# Patient Record
Sex: Female | Born: 1955 | Hispanic: No | Marital: Married | Smoking: Former smoker
Health system: Southern US, Community
[De-identification: ages and names within clinical notes are randomized; demographics above are authoritative.]

## PROBLEM LIST (undated history)

## (undated) DIAGNOSIS — G4730 Sleep apnea, unspecified: Secondary | ICD-10-CM

## (undated) DIAGNOSIS — M1990 Unspecified osteoarthritis, unspecified site: Secondary | ICD-10-CM

## (undated) DIAGNOSIS — M810 Age-related osteoporosis without current pathological fracture: Secondary | ICD-10-CM

## (undated) HISTORY — DX: Age-related osteoporosis without current pathological fracture: M81.0

## (undated) HISTORY — PX: OTHER SURGICAL HISTORY: SHX169

## (undated) HISTORY — PX: AUGMENTATION MAMMAPLASTY: SUR837

---

## 1999-03-26 ENCOUNTER — Encounter: Admission: RE | Admit: 1999-03-26 | Discharge: 1999-03-26 | Payer: Self-pay | Admitting: Obstetrics & Gynecology

## 1999-04-11 ENCOUNTER — Ambulatory Visit (HOSPITAL_COMMUNITY): Admission: RE | Admit: 1999-04-11 | Discharge: 1999-04-11 | Payer: Self-pay | Admitting: *Deleted

## 2000-09-24 ENCOUNTER — Emergency Department (HOSPITAL_COMMUNITY): Admission: EM | Admit: 2000-09-24 | Discharge: 2000-09-25 | Payer: Self-pay | Admitting: *Deleted

## 2000-09-25 ENCOUNTER — Encounter: Payer: Self-pay | Admitting: Gynecology

## 2000-09-25 ENCOUNTER — Encounter: Payer: Self-pay | Admitting: Emergency Medicine

## 2000-09-25 ENCOUNTER — Ambulatory Visit (HOSPITAL_COMMUNITY): Admission: RE | Admit: 2000-09-25 | Discharge: 2000-09-25 | Payer: Self-pay | Admitting: Gynecology

## 2000-10-13 ENCOUNTER — Other Ambulatory Visit: Admission: RE | Admit: 2000-10-13 | Discharge: 2000-10-13 | Payer: Self-pay | Admitting: Gynecology

## 2009-07-20 ENCOUNTER — Ambulatory Visit: Payer: Self-pay | Admitting: Internal Medicine

## 2009-07-20 ENCOUNTER — Encounter (INDEPENDENT_AMBULATORY_CARE_PROVIDER_SITE_OTHER): Payer: Self-pay | Admitting: Family Medicine

## 2009-07-20 LAB — CONVERTED CEMR LAB
BUN: 17 mg/dL (ref 6–23)
CO2: 23 meq/L (ref 19–32)
Calcium: 9 mg/dL (ref 8.4–10.5)
Chloride: 106 meq/L (ref 96–112)
Creatinine, Ser: 0.69 mg/dL (ref 0.40–1.20)
Glucose, Bld: 98 mg/dL (ref 70–99)
Potassium: 4.4 meq/L (ref 3.5–5.3)
Sodium: 140 meq/L (ref 135–145)

## 2011-05-22 DIAGNOSIS — Z96649 Presence of unspecified artificial hip joint: Secondary | ICD-10-CM | POA: Insufficient documentation

## 2014-11-01 DIAGNOSIS — G5603 Carpal tunnel syndrome, bilateral upper limbs: Secondary | ICD-10-CM | POA: Insufficient documentation

## 2014-11-01 DIAGNOSIS — D179 Benign lipomatous neoplasm, unspecified: Secondary | ICD-10-CM | POA: Insufficient documentation

## 2015-10-11 DIAGNOSIS — Z96641 Presence of right artificial hip joint: Secondary | ICD-10-CM | POA: Insufficient documentation

## 2016-01-23 DIAGNOSIS — G8929 Other chronic pain: Secondary | ICD-10-CM | POA: Diagnosis not present

## 2016-01-23 DIAGNOSIS — Z471 Aftercare following joint replacement surgery: Secondary | ICD-10-CM | POA: Diagnosis not present

## 2016-01-23 DIAGNOSIS — M5441 Lumbago with sciatica, right side: Secondary | ICD-10-CM | POA: Diagnosis not present

## 2016-01-23 DIAGNOSIS — Z87891 Personal history of nicotine dependence: Secondary | ICD-10-CM | POA: Diagnosis not present

## 2016-01-23 DIAGNOSIS — Z96643 Presence of artificial hip joint, bilateral: Secondary | ICD-10-CM | POA: Diagnosis not present

## 2016-02-28 DIAGNOSIS — E785 Hyperlipidemia, unspecified: Secondary | ICD-10-CM | POA: Diagnosis not present

## 2016-02-28 DIAGNOSIS — E559 Vitamin D deficiency, unspecified: Secondary | ICD-10-CM | POA: Diagnosis not present

## 2016-02-28 DIAGNOSIS — G4700 Insomnia, unspecified: Secondary | ICD-10-CM | POA: Diagnosis not present

## 2016-02-28 DIAGNOSIS — F329 Major depressive disorder, single episode, unspecified: Secondary | ICD-10-CM | POA: Diagnosis not present

## 2016-02-28 DIAGNOSIS — M160 Bilateral primary osteoarthritis of hip: Secondary | ICD-10-CM | POA: Diagnosis not present

## 2016-02-28 DIAGNOSIS — G629 Polyneuropathy, unspecified: Secondary | ICD-10-CM | POA: Diagnosis not present

## 2016-02-28 DIAGNOSIS — Z23 Encounter for immunization: Secondary | ICD-10-CM | POA: Diagnosis not present

## 2016-03-13 ENCOUNTER — Emergency Department (HOSPITAL_COMMUNITY): Payer: No Typology Code available for payment source

## 2016-03-13 ENCOUNTER — Encounter (HOSPITAL_COMMUNITY): Payer: Self-pay

## 2016-03-13 ENCOUNTER — Emergency Department (HOSPITAL_COMMUNITY)
Admission: EM | Admit: 2016-03-13 | Discharge: 2016-03-13 | Disposition: A | Discharge status: Home / Self Care | Payer: No Typology Code available for payment source | Attending: Emergency Medicine | Admitting: Emergency Medicine

## 2016-03-13 DIAGNOSIS — M25551 Pain in right hip: Secondary | ICD-10-CM | POA: Diagnosis not present

## 2016-03-13 DIAGNOSIS — Z96643 Presence of artificial hip joint, bilateral: Secondary | ICD-10-CM | POA: Insufficient documentation

## 2016-03-13 DIAGNOSIS — M545 Low back pain: Secondary | ICD-10-CM | POA: Diagnosis not present

## 2016-03-13 DIAGNOSIS — Y92410 Unspecified street and highway as the place of occurrence of the external cause: Secondary | ICD-10-CM | POA: Diagnosis not present

## 2016-03-13 DIAGNOSIS — S79912A Unspecified injury of left hip, initial encounter: Secondary | ICD-10-CM | POA: Diagnosis not present

## 2016-03-13 DIAGNOSIS — Y939 Activity, unspecified: Secondary | ICD-10-CM | POA: Insufficient documentation

## 2016-03-13 DIAGNOSIS — Y999 Unspecified external cause status: Secondary | ICD-10-CM | POA: Diagnosis not present

## 2016-03-13 DIAGNOSIS — T148XXA Other injury of unspecified body region, initial encounter: Secondary | ICD-10-CM | POA: Diagnosis not present

## 2016-03-13 DIAGNOSIS — M7918 Myalgia, other site: Secondary | ICD-10-CM

## 2016-03-13 DIAGNOSIS — M25552 Pain in left hip: Secondary | ICD-10-CM | POA: Diagnosis not present

## 2016-03-13 DIAGNOSIS — S79911A Unspecified injury of right hip, initial encounter: Secondary | ICD-10-CM | POA: Diagnosis not present

## 2016-03-13 DIAGNOSIS — M542 Cervicalgia: Secondary | ICD-10-CM | POA: Diagnosis not present

## 2016-03-13 DIAGNOSIS — M5489 Other dorsalgia: Secondary | ICD-10-CM | POA: Diagnosis not present

## 2016-03-13 MED ORDER — ACETAMINOPHEN 500 MG PO TABS
1000.0000 mg | ORAL_TABLET | Freq: Once | ORAL | Status: AC
  Administered 2016-03-13: 1000 mg via ORAL
  Filled 2016-03-13: qty 2
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
## 2016-03-13 NOTE — ED Triage Notes (Signed)
Pt arrives in c-collar pt does not have any neck pain just c/o lower back pain

## 2016-03-24 DIAGNOSIS — M5135 Other intervertebral disc degeneration, thoracolumbar region: Secondary | ICD-10-CM | POA: Diagnosis not present

## 2016-03-24 DIAGNOSIS — M5441 Lumbago with sciatica, right side: Secondary | ICD-10-CM | POA: Diagnosis not present

## 2016-03-24 DIAGNOSIS — M51369 Other intervertebral disc degeneration, lumbar region without mention of lumbar back pain or lower extremity pain: Secondary | ICD-10-CM | POA: Insufficient documentation

## 2016-03-24 DIAGNOSIS — M5184 Other intervertebral disc disorders, thoracic region: Secondary | ICD-10-CM | POA: Diagnosis not present

## 2016-03-24 DIAGNOSIS — M533 Sacrococcygeal disorders, not elsewhere classified: Secondary | ICD-10-CM | POA: Diagnosis not present

## 2016-03-24 DIAGNOSIS — M5136 Other intervertebral disc degeneration, lumbar region: Secondary | ICD-10-CM | POA: Diagnosis not present

## 2016-03-24 DIAGNOSIS — M8580 Other specified disorders of bone density and structure, unspecified site: Secondary | ICD-10-CM | POA: Diagnosis not present

## 2016-03-24 DIAGNOSIS — M1289 Other specific arthropathies, not elsewhere classified, multiple sites: Secondary | ICD-10-CM | POA: Diagnosis not present

## 2016-03-24 DIAGNOSIS — G8929 Other chronic pain: Secondary | ICD-10-CM | POA: Diagnosis not present

## 2016-03-24 DIAGNOSIS — M47816 Spondylosis without myelopathy or radiculopathy, lumbar region: Secondary | ICD-10-CM | POA: Insufficient documentation

## 2016-03-24 DIAGNOSIS — M4056 Lordosis, unspecified, lumbar region: Secondary | ICD-10-CM | POA: Diagnosis not present

## 2016-03-24 DIAGNOSIS — Z96643 Presence of artificial hip joint, bilateral: Secondary | ICD-10-CM | POA: Diagnosis not present

## 2016-03-24 DIAGNOSIS — Q760 Spina bifida occulta: Secondary | ICD-10-CM | POA: Diagnosis not present

## 2016-03-24 DIAGNOSIS — M5134 Other intervertebral disc degeneration, thoracic region: Secondary | ICD-10-CM | POA: Diagnosis not present

## 2016-03-24 DIAGNOSIS — M4316 Spondylolisthesis, lumbar region: Secondary | ICD-10-CM | POA: Diagnosis not present

## 2016-04-01 DIAGNOSIS — E559 Vitamin D deficiency, unspecified: Secondary | ICD-10-CM | POA: Diagnosis not present

## 2016-04-01 DIAGNOSIS — N959 Unspecified menopausal and perimenopausal disorder: Secondary | ICD-10-CM | POA: Diagnosis not present

## 2016-04-01 DIAGNOSIS — F329 Major depressive disorder, single episode, unspecified: Secondary | ICD-10-CM | POA: Diagnosis not present

## 2016-04-01 DIAGNOSIS — G629 Polyneuropathy, unspecified: Secondary | ICD-10-CM | POA: Diagnosis not present

## 2016-04-01 DIAGNOSIS — M160 Bilateral primary osteoarthritis of hip: Secondary | ICD-10-CM | POA: Diagnosis not present

## 2016-04-01 DIAGNOSIS — E785 Hyperlipidemia, unspecified: Secondary | ICD-10-CM | POA: Diagnosis not present

## 2016-04-01 DIAGNOSIS — G4700 Insomnia, unspecified: Secondary | ICD-10-CM | POA: Diagnosis not present

## 2016-04-03 DIAGNOSIS — S134XXA Sprain of ligaments of cervical spine, initial encounter: Secondary | ICD-10-CM | POA: Diagnosis not present

## 2016-04-03 DIAGNOSIS — M5416 Radiculopathy, lumbar region: Secondary | ICD-10-CM | POA: Diagnosis not present

## 2016-04-14 DIAGNOSIS — M5416 Radiculopathy, lumbar region: Secondary | ICD-10-CM | POA: Diagnosis not present

## 2016-04-17 DIAGNOSIS — M5416 Radiculopathy, lumbar region: Secondary | ICD-10-CM | POA: Diagnosis not present

## 2016-04-21 DIAGNOSIS — M5416 Radiculopathy, lumbar region: Secondary | ICD-10-CM | POA: Diagnosis not present

## 2016-04-24 DIAGNOSIS — M5416 Radiculopathy, lumbar region: Secondary | ICD-10-CM | POA: Diagnosis not present

## 2016-04-29 DIAGNOSIS — M5416 Radiculopathy, lumbar region: Secondary | ICD-10-CM | POA: Diagnosis not present

## 2016-05-06 DIAGNOSIS — M5416 Radiculopathy, lumbar region: Secondary | ICD-10-CM | POA: Diagnosis not present

## 2016-05-08 DIAGNOSIS — M5416 Radiculopathy, lumbar region: Secondary | ICD-10-CM | POA: Diagnosis not present

## 2016-05-09 DIAGNOSIS — F329 Major depressive disorder, single episode, unspecified: Secondary | ICD-10-CM | POA: Diagnosis not present

## 2016-05-09 DIAGNOSIS — E559 Vitamin D deficiency, unspecified: Secondary | ICD-10-CM | POA: Diagnosis not present

## 2016-05-09 DIAGNOSIS — E785 Hyperlipidemia, unspecified: Secondary | ICD-10-CM | POA: Diagnosis not present

## 2016-05-09 DIAGNOSIS — N959 Unspecified menopausal and perimenopausal disorder: Secondary | ICD-10-CM | POA: Diagnosis not present

## 2016-05-09 DIAGNOSIS — G4700 Insomnia, unspecified: Secondary | ICD-10-CM | POA: Diagnosis not present

## 2016-05-09 DIAGNOSIS — R32 Unspecified urinary incontinence: Secondary | ICD-10-CM | POA: Diagnosis not present

## 2016-05-09 DIAGNOSIS — R3915 Urgency of urination: Secondary | ICD-10-CM | POA: Diagnosis not present

## 2016-05-09 DIAGNOSIS — M160 Bilateral primary osteoarthritis of hip: Secondary | ICD-10-CM | POA: Diagnosis not present

## 2016-05-09 DIAGNOSIS — G629 Polyneuropathy, unspecified: Secondary | ICD-10-CM | POA: Diagnosis not present

## 2016-05-13 DIAGNOSIS — M5416 Radiculopathy, lumbar region: Secondary | ICD-10-CM | POA: Diagnosis not present

## 2016-05-15 DIAGNOSIS — M5416 Radiculopathy, lumbar region: Secondary | ICD-10-CM | POA: Diagnosis not present

## 2016-05-20 DIAGNOSIS — M5416 Radiculopathy, lumbar region: Secondary | ICD-10-CM | POA: Diagnosis not present

## 2016-05-28 DIAGNOSIS — R202 Paresthesia of skin: Secondary | ICD-10-CM | POA: Diagnosis not present

## 2016-05-28 DIAGNOSIS — G5711 Meralgia paresthetica, right lower limb: Secondary | ICD-10-CM | POA: Diagnosis not present

## 2016-05-30 DIAGNOSIS — M5441 Lumbago with sciatica, right side: Secondary | ICD-10-CM | POA: Diagnosis not present

## 2016-05-30 DIAGNOSIS — G8929 Other chronic pain: Secondary | ICD-10-CM | POA: Diagnosis not present

## 2016-05-30 DIAGNOSIS — M5136 Other intervertebral disc degeneration, lumbar region: Secondary | ICD-10-CM | POA: Diagnosis not present

## 2016-06-17 DIAGNOSIS — M7138 Other bursal cyst, other site: Secondary | ICD-10-CM | POA: Diagnosis not present

## 2016-06-17 DIAGNOSIS — G8929 Other chronic pain: Secondary | ICD-10-CM | POA: Diagnosis not present

## 2016-06-17 DIAGNOSIS — M5136 Other intervertebral disc degeneration, lumbar region: Secondary | ICD-10-CM | POA: Diagnosis not present

## 2016-06-17 DIAGNOSIS — M47816 Spondylosis without myelopathy or radiculopathy, lumbar region: Secondary | ICD-10-CM | POA: Diagnosis not present

## 2016-06-17 DIAGNOSIS — Z87891 Personal history of nicotine dependence: Secondary | ICD-10-CM | POA: Diagnosis not present

## 2016-06-17 DIAGNOSIS — M5441 Lumbago with sciatica, right side: Secondary | ICD-10-CM | POA: Diagnosis not present

## 2016-06-17 DIAGNOSIS — M48061 Spinal stenosis, lumbar region without neurogenic claudication: Secondary | ICD-10-CM | POA: Diagnosis not present

## 2016-06-20 DIAGNOSIS — G629 Polyneuropathy, unspecified: Secondary | ICD-10-CM | POA: Diagnosis not present

## 2016-06-20 DIAGNOSIS — M160 Bilateral primary osteoarthritis of hip: Secondary | ICD-10-CM | POA: Diagnosis not present

## 2016-06-20 DIAGNOSIS — F329 Major depressive disorder, single episode, unspecified: Secondary | ICD-10-CM | POA: Diagnosis not present

## 2016-06-20 DIAGNOSIS — R32 Unspecified urinary incontinence: Secondary | ICD-10-CM | POA: Diagnosis not present

## 2016-06-20 DIAGNOSIS — E785 Hyperlipidemia, unspecified: Secondary | ICD-10-CM | POA: Diagnosis not present

## 2016-06-20 DIAGNOSIS — N959 Unspecified menopausal and perimenopausal disorder: Secondary | ICD-10-CM | POA: Diagnosis not present

## 2016-06-20 DIAGNOSIS — G4700 Insomnia, unspecified: Secondary | ICD-10-CM | POA: Diagnosis not present

## 2016-06-20 DIAGNOSIS — E559 Vitamin D deficiency, unspecified: Secondary | ICD-10-CM | POA: Diagnosis not present

## 2016-06-20 DIAGNOSIS — R3915 Urgency of urination: Secondary | ICD-10-CM | POA: Diagnosis not present

## 2016-07-18 DIAGNOSIS — R350 Frequency of micturition: Secondary | ICD-10-CM | POA: Diagnosis not present

## 2016-07-18 DIAGNOSIS — N3942 Incontinence without sensory awareness: Secondary | ICD-10-CM | POA: Diagnosis not present

## 2016-07-18 DIAGNOSIS — R351 Nocturia: Secondary | ICD-10-CM | POA: Diagnosis not present

## 2016-07-18 DIAGNOSIS — N393 Stress incontinence (female) (male): Secondary | ICD-10-CM | POA: Diagnosis not present

## 2016-07-24 DIAGNOSIS — N393 Stress incontinence (female) (male): Secondary | ICD-10-CM | POA: Diagnosis not present

## 2016-07-24 DIAGNOSIS — N3942 Incontinence without sensory awareness: Secondary | ICD-10-CM | POA: Diagnosis not present

## 2016-07-24 DIAGNOSIS — R350 Frequency of micturition: Secondary | ICD-10-CM | POA: Diagnosis not present

## 2016-07-24 DIAGNOSIS — R351 Nocturia: Secondary | ICD-10-CM | POA: Diagnosis not present

## 2016-08-01 DIAGNOSIS — G5603 Carpal tunnel syndrome, bilateral upper limbs: Secondary | ICD-10-CM | POA: Diagnosis not present

## 2016-08-13 DIAGNOSIS — M5416 Radiculopathy, lumbar region: Secondary | ICD-10-CM | POA: Diagnosis not present

## 2016-08-14 DIAGNOSIS — G5603 Carpal tunnel syndrome, bilateral upper limbs: Secondary | ICD-10-CM | POA: Diagnosis not present

## 2016-08-15 DIAGNOSIS — M5416 Radiculopathy, lumbar region: Secondary | ICD-10-CM | POA: Diagnosis not present

## 2016-08-22 DIAGNOSIS — M5416 Radiculopathy, lumbar region: Secondary | ICD-10-CM | POA: Diagnosis not present

## 2016-08-25 DIAGNOSIS — M160 Bilateral primary osteoarthritis of hip: Secondary | ICD-10-CM | POA: Diagnosis not present

## 2016-08-25 DIAGNOSIS — E559 Vitamin D deficiency, unspecified: Secondary | ICD-10-CM | POA: Diagnosis not present

## 2016-08-25 DIAGNOSIS — G4700 Insomnia, unspecified: Secondary | ICD-10-CM | POA: Diagnosis not present

## 2016-08-25 DIAGNOSIS — F329 Major depressive disorder, single episode, unspecified: Secondary | ICD-10-CM | POA: Diagnosis not present

## 2016-08-25 DIAGNOSIS — G629 Polyneuropathy, unspecified: Secondary | ICD-10-CM | POA: Diagnosis not present

## 2016-08-25 DIAGNOSIS — R3915 Urgency of urination: Secondary | ICD-10-CM | POA: Diagnosis not present

## 2016-08-25 DIAGNOSIS — R32 Unspecified urinary incontinence: Secondary | ICD-10-CM | POA: Diagnosis not present

## 2016-08-25 DIAGNOSIS — N959 Unspecified menopausal and perimenopausal disorder: Secondary | ICD-10-CM | POA: Diagnosis not present

## 2016-08-25 DIAGNOSIS — E785 Hyperlipidemia, unspecified: Secondary | ICD-10-CM | POA: Diagnosis not present

## 2016-09-30 DIAGNOSIS — G5602 Carpal tunnel syndrome, left upper limb: Secondary | ICD-10-CM | POA: Diagnosis not present

## 2016-09-30 DIAGNOSIS — Z6827 Body mass index (BMI) 27.0-27.9, adult: Secondary | ICD-10-CM | POA: Diagnosis not present

## 2016-10-03 DIAGNOSIS — G4700 Insomnia, unspecified: Secondary | ICD-10-CM | POA: Diagnosis not present

## 2016-10-03 DIAGNOSIS — N959 Unspecified menopausal and perimenopausal disorder: Secondary | ICD-10-CM | POA: Diagnosis not present

## 2016-10-03 DIAGNOSIS — G629 Polyneuropathy, unspecified: Secondary | ICD-10-CM | POA: Diagnosis not present

## 2016-10-03 DIAGNOSIS — F329 Major depressive disorder, single episode, unspecified: Secondary | ICD-10-CM | POA: Diagnosis not present

## 2016-10-03 DIAGNOSIS — M160 Bilateral primary osteoarthritis of hip: Secondary | ICD-10-CM | POA: Diagnosis not present

## 2016-10-03 DIAGNOSIS — R32 Unspecified urinary incontinence: Secondary | ICD-10-CM | POA: Diagnosis not present

## 2016-10-03 DIAGNOSIS — N390 Urinary tract infection, site not specified: Secondary | ICD-10-CM | POA: Diagnosis not present

## 2016-10-03 DIAGNOSIS — E559 Vitamin D deficiency, unspecified: Secondary | ICD-10-CM | POA: Diagnosis not present

## 2016-10-03 DIAGNOSIS — E785 Hyperlipidemia, unspecified: Secondary | ICD-10-CM | POA: Diagnosis not present

## 2016-10-03 DIAGNOSIS — R3915 Urgency of urination: Secondary | ICD-10-CM | POA: Diagnosis not present

## 2016-10-06 DIAGNOSIS — G5602 Carpal tunnel syndrome, left upper limb: Secondary | ICD-10-CM | POA: Diagnosis not present

## 2016-10-15 DIAGNOSIS — N3942 Incontinence without sensory awareness: Secondary | ICD-10-CM | POA: Diagnosis not present

## 2016-10-15 DIAGNOSIS — N393 Stress incontinence (female) (male): Secondary | ICD-10-CM | POA: Diagnosis not present

## 2016-10-15 DIAGNOSIS — R350 Frequency of micturition: Secondary | ICD-10-CM | POA: Diagnosis not present

## 2016-10-31 DIAGNOSIS — Z9882 Breast implant status: Secondary | ICD-10-CM | POA: Diagnosis not present

## 2016-10-31 DIAGNOSIS — Z1231 Encounter for screening mammogram for malignant neoplasm of breast: Secondary | ICD-10-CM | POA: Diagnosis not present

## 2016-12-24 DIAGNOSIS — E785 Hyperlipidemia, unspecified: Secondary | ICD-10-CM | POA: Diagnosis not present

## 2016-12-24 DIAGNOSIS — R32 Unspecified urinary incontinence: Secondary | ICD-10-CM | POA: Diagnosis not present

## 2016-12-24 DIAGNOSIS — M160 Bilateral primary osteoarthritis of hip: Secondary | ICD-10-CM | POA: Diagnosis not present

## 2016-12-24 DIAGNOSIS — F329 Major depressive disorder, single episode, unspecified: Secondary | ICD-10-CM | POA: Diagnosis not present

## 2016-12-24 DIAGNOSIS — E559 Vitamin D deficiency, unspecified: Secondary | ICD-10-CM | POA: Diagnosis not present

## 2016-12-24 DIAGNOSIS — N959 Unspecified menopausal and perimenopausal disorder: Secondary | ICD-10-CM | POA: Diagnosis not present

## 2016-12-24 DIAGNOSIS — J029 Acute pharyngitis, unspecified: Secondary | ICD-10-CM | POA: Diagnosis not present

## 2016-12-24 DIAGNOSIS — R3915 Urgency of urination: Secondary | ICD-10-CM | POA: Diagnosis not present

## 2016-12-24 DIAGNOSIS — N63 Unspecified lump in unspecified breast: Secondary | ICD-10-CM | POA: Diagnosis not present

## 2016-12-24 DIAGNOSIS — G4700 Insomnia, unspecified: Secondary | ICD-10-CM | POA: Diagnosis not present

## 2016-12-24 DIAGNOSIS — G629 Polyneuropathy, unspecified: Secondary | ICD-10-CM | POA: Diagnosis not present

## 2017-02-11 DIAGNOSIS — G5603 Carpal tunnel syndrome, bilateral upper limbs: Secondary | ICD-10-CM | POA: Diagnosis not present

## 2017-03-04 DIAGNOSIS — N3942 Incontinence without sensory awareness: Secondary | ICD-10-CM | POA: Diagnosis not present

## 2017-03-04 DIAGNOSIS — R351 Nocturia: Secondary | ICD-10-CM | POA: Diagnosis not present

## 2017-03-25 DIAGNOSIS — G5601 Carpal tunnel syndrome, right upper limb: Secondary | ICD-10-CM | POA: Diagnosis not present

## 2017-04-24 DIAGNOSIS — F329 Major depressive disorder, single episode, unspecified: Secondary | ICD-10-CM | POA: Diagnosis not present

## 2017-04-24 DIAGNOSIS — E785 Hyperlipidemia, unspecified: Secondary | ICD-10-CM | POA: Diagnosis not present

## 2017-04-24 DIAGNOSIS — R32 Unspecified urinary incontinence: Secondary | ICD-10-CM | POA: Diagnosis not present

## 2017-04-24 DIAGNOSIS — G629 Polyneuropathy, unspecified: Secondary | ICD-10-CM | POA: Diagnosis not present

## 2017-04-24 DIAGNOSIS — N959 Unspecified menopausal and perimenopausal disorder: Secondary | ICD-10-CM | POA: Diagnosis not present

## 2017-04-24 DIAGNOSIS — M160 Bilateral primary osteoarthritis of hip: Secondary | ICD-10-CM | POA: Diagnosis not present

## 2017-04-24 DIAGNOSIS — R3915 Urgency of urination: Secondary | ICD-10-CM | POA: Diagnosis not present

## 2017-04-24 DIAGNOSIS — G4700 Insomnia, unspecified: Secondary | ICD-10-CM | POA: Diagnosis not present

## 2017-04-24 DIAGNOSIS — E559 Vitamin D deficiency, unspecified: Secondary | ICD-10-CM | POA: Diagnosis not present

## 2017-06-02 ENCOUNTER — Encounter: Payer: Self-pay | Admitting: Pulmonary Disease

## 2017-06-02 ENCOUNTER — Ambulatory Visit (INDEPENDENT_AMBULATORY_CARE_PROVIDER_SITE_OTHER): Payer: Medicare Other | Admitting: Pulmonary Disease

## 2017-06-02 DIAGNOSIS — G4733 Obstructive sleep apnea (adult) (pediatric): Secondary | ICD-10-CM | POA: Insufficient documentation

## 2017-06-02 DIAGNOSIS — F5104 Psychophysiologic insomnia: Secondary | ICD-10-CM

## 2017-06-02 DIAGNOSIS — G4700 Insomnia, unspecified: Secondary | ICD-10-CM | POA: Insufficient documentation

## 2017-06-02 NOTE — Progress Notes (Signed)
   Subjective:    Patient ID: April Bowman, female    DOB: 11/27/1955, 62 y.o.   MRN: [id number]  HPI  Chief Complaint  Patient presents with  . 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 Loud snoring has been noted by her boyfriend.  He wonders if she needs a "mask" for her sleep.  Daughter has also complained that she could hear snoring in the next room.  She was given 35 pounds over the last few years. She also reports long-standing insomnia for at least 20 years for which she has needed medication on and off.  Over the years she has tried alcohol including a glass of tequila at bedtime, melatonin over-the-counter has not helped, at one time she got oxycodone for pain pills on this seemed to help somewhat.  Over the last 5 years she has been using hydroxyzine that she was given for itching and nortriptyline that she was given for nerve pain from her carpal tunnel syndrome.  She takes this around 7 PM so she can fall asleep by 9 or 10 PM.  She also takes valerian root over-the-counter.  Epworth sleepiness score is 3 and she denies daytime somnolence except on days when she takes her medications late and this may lead to a hangover effect. Bedtime is between 10:11 PM, sleep latency is 2 to 3 hours, she sleeps on her side with one pillow, reports 2-3 nocturnal awakenings and denies nocturia.  She tries to limit her water intake in the daytime. She is out of bed around 7 AM feeling tired with occasional dryness of mouth but denies headaches. There is no history suggestive of cataplexy, sleep paralysis or parasomnias  She denies menopausal symptoms and is on estradiol replacement. She had her right hip replaced 1 year ago    History reviewed. No pertinent past medical history.   No known allergies. Family history-no  family history of early CAD or arthritis related issues.  Social works as a Advertising copywriter, emigrated from [place], lives with her boyfriend-, never smoker  Review of Systems  Constitutional: negative for anorexia, fevers and sweats  Eyes: negative for irritation, redness and visual disturbance  Ears, nose, mouth, throat, and face: negative for earaches, epistaxis, nasal congestion and sore throat  Respiratory: negative for cough, dyspnea on exertion, sputum and wheezing  Cardiovascular: negative for chest pain, dyspnea, lower extremity edema, orthopnea, palpitations and syncope  Gastrointestinal: negative for abdominal pain, constipation, diarrhea, melena, nausea and vomiting  Genitourinary:negative for dysuria, frequency and hematuria  Hematologic/lymphatic: negative for bleeding, easy bruising and lymphadenopathy  Musculoskeletal:negative for arthralgias, muscle weakness and stiff joints  Neurological: negative for coordination problems, gait problems, headaches and weakness  Endocrine: negative for diabetic symptoms including polydipsia, polyuria and weight loss     Objective:   Physical Exam  Gen. Pleasant, obese, in no distress ENT - no lesions,class 2 airway no post nasal drip Neck: No JVD, no thyromegaly, no carotid bruits Lungs: no use of accessory muscles, no dullness to percussion, decreased without rales or rhonchi  Cardiovascular: Rhythm regular, heart sounds  normal, no murmurs or gallops, no peripheral edema Musculoskeletal: No deformities, no cyanosis or clubbing , no tremors       Assessment & Plan:

## 2017-06-02 NOTE — Assessment & Plan Note (Signed)
Schedule home sleep study.  Given excessive daytime somnolence, narrow pharyngeal exam, witnessed apneas & loud snoring, obstructive sleep apnea is very likely & an overnight polysomnogram will be scheduled as a home study. The pathophysiology of obstructive sleep apnea , it's cardiovascular consequences & modes of treatment including CPAP were discused with the patient in detail & they evidenced understanding.

## 2017-06-02 NOTE — Assessment & Plan Note (Addendum)
Rules of sleep hygiene were discussed  - light exercise -avoid caffeinated beverages - no more than 20 mins staying awake in bed, if not asleep, get out of bed & reading or light music - No TV or computer games at bedtime.   Try melatonin 10 mg 1 hour prior to bedtime. Options for the future for your insomnia include   -trazodone 50 mg or -Ambien   You can discuss with your primary care doctor

## 2017-06-02 NOTE — Patient Instructions (Signed)
Schedule home sleep study. Try melatonin 10 mg 1 hour prior to bedtime. Options for the future for your insomnia include   -trazodone 50 mg or -Ambien   You can discuss with your primary care doctor

## 2017-07-03 ENCOUNTER — Telehealth: Payer: Self-pay | Admitting: Pulmonary Disease

## 2017-07-03 MED ORDER — TRAZODONE HCL 50 MG PO TABS: ORAL_TABLET | ORAL | 0 refills | Status: DC

## 2017-07-03 NOTE — Telephone Encounter (Signed)
Patient came in this morning to pick up her HST machine. She is currently taking hydroxyzone 25mg  and nortriptyline 75mg  daily at bedtime. She wants to know if she could placed on ONE sleeping medication.   Patient can be reached at [phone]  RA, please advise. Thanks!

## 2017-07-03 NOTE — Telephone Encounter (Signed)
Trial of trazodone 50 mg 1 tablet at bedtime This does not work for 3 nights, can increase to 2 tablets at bedtime # 15

## 2017-07-03 NOTE — Telephone Encounter (Signed)
Called and spoke with pt letting her know that we were going to be doing a trial of trazodone due to her wanting to be only on 1 sleep med.  Stated to her the instructions of taking 1 for three nights and if 1 has not worked, she can increase to 2 tabs. Also stated to pt we were only sending a quantity of 15 until we know that the med has worked for her.  Pt expressed understanding.   Called pt's pharmacy and gave a verbal prescription information for the trazodone 50mg  and stated to them that while she is doing the trial, she will not be taking the hydroxyzine or the nortriptyline.  Called and spoke with pt making her aware the script had been sent in and made sure she was aware to not take the hydroxyzine and nortriptyline while taking the trazodone.  Pt expressed understanding. Nothing further needed.

## 2017-07-07 ENCOUNTER — Other Ambulatory Visit: Payer: Self-pay | Admitting: *Deleted

## 2017-07-07 DIAGNOSIS — G4733 Obstructive sleep apnea (adult) (pediatric): Secondary | ICD-10-CM

## 2017-07-08 ENCOUNTER — Telehealth: Payer: Self-pay | Admitting: Pulmonary Disease

## 2017-07-08 DIAGNOSIS — G4733 Obstructive sleep apnea (adult) (pediatric): Secondary | ICD-10-CM

## 2017-07-08 NOTE — Telephone Encounter (Signed)
Per RA, patient will need to repeat HST study.

## 2017-07-14 ENCOUNTER — Telehealth: Payer: Self-pay | Admitting: Pulmonary Disease

## 2017-07-14 NOTE — Telephone Encounter (Signed)
Pt is calling about HST. Per Pt she does not believe she did the test correctly. Would like to speak to a nurse. CB is [phone].

## 2017-07-14 NOTE — Telephone Encounter (Signed)
Called pt and stated to her the HST will need to be repeated.  Pt was aware due to stating she did not use the machine the correct way to do the test.  An order has been placed for pt to repeat the HST.  Nothing further needed at this time.

## 2017-07-14 NOTE — Telephone Encounter (Signed)
Error

## 2017-07-22 ENCOUNTER — Telehealth: Payer: Self-pay | Admitting: Pulmonary Disease

## 2017-07-22 NOTE — Telephone Encounter (Signed)
Patient called back and states pharm is [facility] [address]

## 2017-07-22 NOTE — Telephone Encounter (Signed)
Dr. Vassie LollAlva is it ok to Rx for pt? I didn't know if you meant that he should speak to his PCP for refills? Please advise.    Patient Instructions by Oretha MilchAlva, Rakesh V, MD at 06/02/2017 11:30 AM  Author: Oretha MilchAlva, Rakesh V, MD Author Type: Physician Filed: 06/02/2017 12:08 PM  Note Status: Signed Cosign: Cosign Not Required Encounter Date: 06/02/2017  Editor: Oretha MilchAlva, Rakesh V, MD (Physician)    Schedule home sleep study. Try melatonin 10 mg 1 hour prior to bedtime. Options for the future for your insomnia include   -trazodone 50 mg or -Ambien 5mg   You can discuss with your primary care doctor    Instructions   Schedule home sleep study. Try melatonin 10 mg 1 hour prior to bedtime. Options for the future for your insomnia include   -trazodone 50 mg or

## 2017-07-23 NOTE — Telephone Encounter (Signed)
Please note the phone note from 6/21  She was asked to stop hydroxyzine and nortriptyline  Trial of trazodone 50 mg 1 tablet at bedtime This does not work for 3 nights, can increase to 2 tablets at bedtime # 15  Has she tried above?  If so did not this does work for her?  I would suggest further prescriptions come from her PCP since she is not going to follow-up with us

## 2017-07-24 MED ORDER — TRAZODONE HCL 50 MG PO TABS: ORAL_TABLET | ORAL | 0 refills | Status: DC

## 2017-07-24 NOTE — Telephone Encounter (Signed)
Spoke with patient. She is aware that [facility]. Nothing else needed at time of call.

## 2017-07-28 DIAGNOSIS — R0683 Snoring: Secondary | ICD-10-CM | POA: Diagnosis not present

## 2017-07-30 ENCOUNTER — Telehealth: Payer: Self-pay | Admitting: Pulmonary Disease

## 2017-07-30 DIAGNOSIS — G4733 Obstructive sleep apnea (adult) (pediatric): Secondary | ICD-10-CM

## 2017-07-30 DIAGNOSIS — R0683 Snoring: Secondary | ICD-10-CM | POA: Diagnosis not present

## 2017-07-30 NOTE — Telephone Encounter (Signed)
Per RA, it was another sub optimal study (2nd time). Recommends a split night study.

## 2017-07-31 NOTE — Telephone Encounter (Signed)
Spoke with patient. She is aware of results. She wishes to proceed with the split night study. Advised patient that I would place the order today and someone would call her shortly to get this scheduled. She verbalized understanding. Nothing else needed at time of call.

## 2017-08-03 ENCOUNTER — Other Ambulatory Visit: Payer: Self-pay | Admitting: *Deleted

## 2017-08-03 DIAGNOSIS — G4733 Obstructive sleep apnea (adult) (pediatric): Secondary | ICD-10-CM

## 2017-08-19 DIAGNOSIS — F329 Major depressive disorder, single episode, unspecified: Secondary | ICD-10-CM | POA: Diagnosis not present

## 2017-08-19 DIAGNOSIS — E559 Vitamin D deficiency, unspecified: Secondary | ICD-10-CM | POA: Diagnosis not present

## 2017-08-19 DIAGNOSIS — R3915 Urgency of urination: Secondary | ICD-10-CM | POA: Diagnosis not present

## 2017-08-19 DIAGNOSIS — E785 Hyperlipidemia, unspecified: Secondary | ICD-10-CM | POA: Diagnosis not present

## 2017-08-19 DIAGNOSIS — M160 Bilateral primary osteoarthritis of hip: Secondary | ICD-10-CM | POA: Diagnosis not present

## 2017-08-19 DIAGNOSIS — G4700 Insomnia, unspecified: Secondary | ICD-10-CM | POA: Diagnosis not present

## 2017-08-19 DIAGNOSIS — G629 Polyneuropathy, unspecified: Secondary | ICD-10-CM | POA: Diagnosis not present

## 2017-08-19 DIAGNOSIS — J302 Other seasonal allergic rhinitis: Secondary | ICD-10-CM | POA: Diagnosis not present

## 2017-08-19 DIAGNOSIS — N959 Unspecified menopausal and perimenopausal disorder: Secondary | ICD-10-CM | POA: Diagnosis not present

## 2017-08-20 ENCOUNTER — Other Ambulatory Visit: Payer: Self-pay | Admitting: Physician Assistant

## 2017-08-20 DIAGNOSIS — Z1231 Encounter for screening mammogram for malignant neoplasm of breast: Secondary | ICD-10-CM

## 2017-08-28 ENCOUNTER — Encounter (HOSPITAL_BASED_OUTPATIENT_CLINIC_OR_DEPARTMENT_OTHER): Payer: Medicare Other

## 2017-09-08 ENCOUNTER — Ambulatory Visit (HOSPITAL_BASED_OUTPATIENT_CLINIC_OR_DEPARTMENT_OTHER): Payer: Medicare Other | Attending: Pulmonary Disease | Admitting: Pulmonary Disease

## 2017-09-08 VITALS — Ht 61.0 in | Wt 150.0 lb

## 2017-09-08 DIAGNOSIS — G4733 Obstructive sleep apnea (adult) (pediatric): Secondary | ICD-10-CM | POA: Insufficient documentation

## 2017-09-08 DIAGNOSIS — R0683 Snoring: Secondary | ICD-10-CM | POA: Diagnosis not present

## 2017-09-08 DIAGNOSIS — R5383 Other fatigue: Secondary | ICD-10-CM | POA: Insufficient documentation

## 2017-09-23 ENCOUNTER — Other Ambulatory Visit: Payer: Self-pay | Admitting: Physician Assistant

## 2017-09-23 ENCOUNTER — Encounter: Payer: Self-pay | Admitting: Radiology

## 2017-09-23 ENCOUNTER — Ambulatory Visit
Admission: RE | Admit: 2017-09-23 | Discharge: 2017-09-23 | Disposition: A | Discharge status: Home / Self Care | Payer: Medicare Other | Source: Ambulatory Visit | Attending: Physician Assistant | Admitting: Physician Assistant

## 2017-09-23 DIAGNOSIS — Z1231 Encounter for screening mammogram for malignant neoplasm of breast: Secondary | ICD-10-CM

## 2017-09-28 ENCOUNTER — Telehealth: Payer: Self-pay | Admitting: Pulmonary Disease

## 2017-09-28 DIAGNOSIS — G4733 Obstructive sleep apnea (adult) (pediatric): Secondary | ICD-10-CM

## 2017-09-28 NOTE — Telephone Encounter (Signed)
Called patient unable to reach left message to give us a call back.

## 2017-09-28 NOTE — Procedures (Signed)
  Patient Name: April Bowman, April Bowman  Study Date: 09/08/2017   Gender: Female  D.O.B: 04-19-1955  Age (years): [age]  Referring Provider: Cyril Mourningakesh Ashrith Sagan MD, ABSM  Height (inches): 61  Interpreting Physician: Cyril Mourningakesh Fareeha Evon MD, ABSM  Weight (lbs): 150  RPSGT: Shelah LewandowskyGregory, Kenyon  BMI: 28  MRN: [id number]  Neck Size: 14.50  <br> <br>  CLINICAL INFORMATION  Sleep Study Type: NPSG Indication for sleep study: Fatigue, OSA, Snoring, Witnessed Apneas Epworth Sleepiness Score: 1 SLEEP STUDY TECHNIQUE  As per the AASM Manual for the Scoring of Sleep and Associated Events v2.3 (April 2016) with a hypopnea requiring 4% desaturations. The channels recorded and monitored were frontal, central and occipital EEG, electrooculogram (EOG), submentalis EMG (chin), nasal and oral airflow, thoracic and abdominal wall motion, anterior tibialis EMG, snore microphone, electrocardiogram, and pulse oximetry. MEDICATIONS  Medications self-administered by patient taken the night of the study : N/A SLEEP ARCHITECTURE  The study was initiated at 11:25:11 PM and ended at 5:32:47 AM. Sleep onset time was 10.9 minutes and the sleep efficiency was 78.5%%. The total sleep time was 288.5 minutes. Stage REM latency was 186.5 minutes. The patient spent 14.2%% of the night in stage N1 sleep, 77.8%% in stage N2 sleep, 0.0%% in stage N3 and 8% in REM. Alpha intrusion was absent. Supine sleep was 4.85%. RESPIRATORY PARAMETERS  The overall apnea/hypopnea index (AHI) was 6.7 per hour. There were 2 total apneas, including 1 obstructive, 1 central and 0 mixed apneas. There were 30 hypopneas and 12 RERAs. The AHI during Stage REM sleep was 31.3 per hour. AHI while supine was 34.3 per hour. The mean oxygen saturation was 93.1%. The minimum SpO2 during sleep was 86.0%. moderate snoring was noted during this study. CARDIAC DATA  The 2 lead EKG demonstrated sinus rhythm. The mean heart rate was 67.9 beats per minute. Other EKG findings include:  None.  LEG MOVEMENT DATA  The total PLMS were 0 with a resulting PLMS index of 0.0. Associated arousal with leg movement index was 0.0 . IMPRESSIONS  - Mild obstructive sleep apnea occurred during this study (AHI = 6.7/h). - No significant central sleep apnea occurred during this study (CAI = 0.2/h).  - Mild oxygen desaturation was noted during this study (Min O2 = 86.0%).  - The patient snored with moderate snoring volume.  - No cardiac abnormalities were noted during this study.  - Clinically significant periodic limb movements did not occur during sleep. No significant associated arousals. DIAGNOSIS  - Obstructive Sleep Apnea (327.23 [G47.33 ICD-10]) RECOMMENDATIONS  - Positional therapy avoiding supine position during sleep.  - Very mild obstructive sleep apnea. Return to discuss treatment options.  - Avoid alcohol, sedatives and other CNS depressants that may worsen sleep apnea and disrupt normal sleep architecture.  - Sleep hygiene should be reviewed to assess factors that may improve sleep quality.  - Weight management and regular exercise should be initiated or continued if appropriate.   Cyril Mourningakesh Kyliyah Stirn MD Board Certified in Sleep medicine

## 2017-09-28 NOTE — Telephone Encounter (Signed)
Very mild obstructive sleep apnea  She would qualify for CPAP machine if she wants to pursue autoCPAP 5-12 cm, nasal pillows, OV in 1 month with NP

## 2017-10-02 NOTE — Telephone Encounter (Signed)
Left message for patient to call back for results.  

## 2017-10-07 NOTE — Telephone Encounter (Signed)
Spoke with pt and notified of results per Dr. Vassie Loll. Pt verbalized understanding and denied any questions. She does want to try CPAP  Order sent to [facility]  Pt aware to call for f/u with NP once she gets started on machine per ins protocol

## 2017-10-13 DIAGNOSIS — F331 Major depressive disorder, recurrent, moderate: Secondary | ICD-10-CM | POA: Diagnosis not present

## 2017-10-13 DIAGNOSIS — G629 Polyneuropathy, unspecified: Secondary | ICD-10-CM | POA: Diagnosis not present

## 2017-10-13 DIAGNOSIS — F5101 Primary insomnia: Secondary | ICD-10-CM | POA: Diagnosis not present

## 2017-10-13 DIAGNOSIS — M160 Bilateral primary osteoarthritis of hip: Secondary | ICD-10-CM | POA: Diagnosis not present

## 2017-10-13 DIAGNOSIS — J302 Other seasonal allergic rhinitis: Secondary | ICD-10-CM | POA: Diagnosis not present

## 2017-10-13 DIAGNOSIS — Z2821 Immunization not carried out because of patient refusal: Secondary | ICD-10-CM | POA: Diagnosis not present

## 2017-10-13 DIAGNOSIS — R202 Paresthesia of skin: Secondary | ICD-10-CM | POA: Diagnosis not present

## 2017-10-13 DIAGNOSIS — E559 Vitamin D deficiency, unspecified: Secondary | ICD-10-CM | POA: Diagnosis not present

## 2017-10-13 DIAGNOSIS — Z0001 Encounter for general adult medical examination with abnormal findings: Secondary | ICD-10-CM | POA: Diagnosis not present

## 2017-10-13 DIAGNOSIS — E785 Hyperlipidemia, unspecified: Secondary | ICD-10-CM | POA: Diagnosis not present

## 2017-10-13 DIAGNOSIS — N959 Unspecified menopausal and perimenopausal disorder: Secondary | ICD-10-CM | POA: Diagnosis not present

## 2017-10-20 DIAGNOSIS — R202 Paresthesia of skin: Secondary | ICD-10-CM | POA: Diagnosis not present

## 2017-10-20 DIAGNOSIS — G5603 Carpal tunnel syndrome, bilateral upper limbs: Secondary | ICD-10-CM | POA: Diagnosis not present

## 2017-10-29 DIAGNOSIS — R930 Abnormal findings on diagnostic imaging of skull and head, not elsewhere classified: Secondary | ICD-10-CM | POA: Diagnosis not present

## 2018-02-05 ENCOUNTER — Encounter: Payer: Self-pay | Admitting: Pulmonary Disease

## 2018-02-05 ENCOUNTER — Ambulatory Visit (INDEPENDENT_AMBULATORY_CARE_PROVIDER_SITE_OTHER): Payer: Medicare Other | Admitting: Pulmonary Disease

## 2018-02-05 DIAGNOSIS — F5104 Psychophysiologic insomnia: Secondary | ICD-10-CM | POA: Diagnosis not present

## 2018-02-05 DIAGNOSIS — G4733 Obstructive sleep apnea (adult) (pediatric): Secondary | ICD-10-CM | POA: Diagnosis not present

## 2018-02-05 NOTE — Assessment & Plan Note (Signed)
Improved

## 2018-02-05 NOTE — Progress Notes (Signed)
   Subjective:    Patient ID: April Bowman, female    DOB: Apr 28, 1955, 63 y.o.   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 She was started on auto CPAP 5 to 12 cm with nasal mask.  This seems to have worked well for her.  She reports resting better, decrease sleep pressure in the daytime.  She initially had some adjustment issues but now has settled down with nasal mask.  She reports that when she had some nasal congestion she did not use it for 2 or 3 nights but has mostly been compliant.  This is confirmed on download which shows good usage up to 6 hours every night, no residual events on auto CPAP settings with average pressure of 9 to 10 cm and mild leak.  She denies dryness and pressure is okay Weight is unchanged   Significant tests/ events reviewed 08/2017 NPSG >> AHI 7/h   Review of Systems Patient denies significant dyspnea,cough, hemoptysis,  chest pain, palpitations, pedal edema, orthopnea, paroxysmal nocturnal dyspnea, lightheadedness, nausea, vomiting, abdominal or  leg pains      Objective:   Physical Exam   Gen. Pleasant, obese, in no distress ENT - no lesions, no post nasal drip Neck: No JVD, no thyromegaly, no carotid bruits Lungs: no use of accessory muscles, no dullness to percussion, decreased without rales or rhonchi  Cardiovascular: Rhythm regular, heart sounds  normal, no murmurs or gallops, no peripheral edema Musculoskeletal: No deformities, no cyanosis or clubbing , no tremors        Assessment & Plan:

## 2018-02-05 NOTE — Assessment & Plan Note (Signed)
Auto CPAP is working well -she is compliant and this is helped improve her daytime somnolence and fatigue CPAP supplies will be renewed for a year. Try nasal pillows instead of nasal mask  Weight loss encouraged, compliance with goal of at least 4-6 hrs every night is the expectation. Advised against medications with sedative side effects Cautioned against driving when sleepy - understanding that sleepiness will vary on a day to day basis

## 2018-02-05 NOTE — Patient Instructions (Signed)
CPAP is working well for you. Try to use this at least 6 hours every night. CPAP supplies will be renewed for a year. Try nasal pillows next time you get supplies

## 2018-02-11 ENCOUNTER — Telehealth: Payer: Self-pay | Admitting: Pulmonary Disease

## 2018-02-11 DIAGNOSIS — G4733 Obstructive sleep apnea (adult) (pediatric): Secondary | ICD-10-CM

## 2018-02-11 NOTE — Telephone Encounter (Signed)
Called and left message for Patient.  Patient had called wondering when she could get CPAP supplies from [facility]. Order for CPAP supplies not ordered.  Last OV with Dr Vassie Loll, 02/05/18- CPAP is working well for you. Try to use this at least 6 hours every night. CPAP supplies will be renewed for a year. Try nasal pillows next time you get supplies  DME order placed.  Nothing further at this time.

## 2018-02-18 ENCOUNTER — Telehealth: Payer: Self-pay | Admitting: Pulmonary Disease

## 2018-02-18 NOTE — Telephone Encounter (Signed)
   02/11/18 10:26 AM  Note    Called and left message for Patient.  Patient had called wondering when she could get CPAP supplies from [facility]. Order for CPAP supplies not ordered.  Last OV with Dr Vassie Loll, 02/05/18- CPAP is working well for you. Try to use this at least 6 hours every night. CPAP supplies will be renewed for a year. Try nasal pillows next time you get supplies  DME order placed.  Nothing further at this time.     Per last phone this has been ordered I have verified this the order has been sent.  Left message for patient to call back.

## 2018-02-22 NOTE — Telephone Encounter (Signed)
Spoke with pt. I have made her aware of Heather's documentation. Pt is going to contact [facility] to follow up on this order. Nothing further was needed.

## 2018-02-22 NOTE — Telephone Encounter (Signed)
Called and left message for Patient to call back to let us know if she received CPAP supplies.

## 2018-02-22 NOTE — Telephone Encounter (Signed)
Pt is calling back [phone]

## 2018-02-24 DIAGNOSIS — G5603 Carpal tunnel syndrome, bilateral upper limbs: Secondary | ICD-10-CM | POA: Diagnosis not present

## 2018-02-24 DIAGNOSIS — F419 Anxiety disorder, unspecified: Secondary | ICD-10-CM | POA: Diagnosis not present

## 2018-02-24 DIAGNOSIS — R202 Paresthesia of skin: Secondary | ICD-10-CM | POA: Diagnosis not present
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
## 2018-12-30 ENCOUNTER — Other Ambulatory Visit: Payer: Self-pay | Admitting: Family Medicine

## 2018-12-30 DIAGNOSIS — Z1231 Encounter for screening mammogram for malignant neoplasm of breast: Secondary | ICD-10-CM

## 2019-02-17 ENCOUNTER — Ambulatory Visit: Payer: Medicare Other

## 2019-05-04 LAB — HM MAMMOGRAPHY

## 2020-05-17 LAB — HM MAMMOGRAPHY

## 2021-02-20 ENCOUNTER — Ambulatory Visit: Payer: Medicare HMO | Attending: Internal Medicine

## 2021-02-20 DIAGNOSIS — Z23 Encounter for immunization: Secondary | ICD-10-CM

## 2021-02-20 NOTE — Progress Notes (Signed)
° °  Covid-19 Vaccination Clinic  Name:  Nyrah Demos    MRN: [id number] DOB: 1955/08/22  02/20/2021  Ms. Tonga was observed post Covid-19 immunization for 15 minutes without incident. She was provided with Vaccine Information Sheet and instruction to access the V-Safe system.   Ms. Jaster was instructed to call 911 with any severe reactions post vaccine: Difficulty breathing  Swelling of face and throat  A fast heartbeat  A bad rash all over body  Dizziness and weakness   Immunizations Administered     Name Date Dose VIS Date Route   Pfizer Covid-19 Vaccine Bivalent Booster 02/20/2021 10:06 AM 0.3 mL 09/12/2020 Intramuscular   Manufacturer: [employer], [employer]   Lot: [id number]   NDC: [phone]

## 2021-02-22 ENCOUNTER — Other Ambulatory Visit (HOSPITAL_BASED_OUTPATIENT_CLINIC_OR_DEPARTMENT_OTHER): Payer: Self-pay

## 2021-02-22 MED ORDER — PFIZER COVID-19 VAC BIVALENT 30 MCG/0.3ML IM SUSP
INTRAMUSCULAR | 0 refills | Status: DC
  Filled 2021-02-22: qty 0.3, 1d supply, fill #0

## 2021-05-23 DIAGNOSIS — M1990 Unspecified osteoarthritis, unspecified site: Secondary | ICD-10-CM | POA: Insufficient documentation

## 2021-05-23 DIAGNOSIS — Z0000 Encounter for general adult medical examination without abnormal findings: Secondary | ICD-10-CM | POA: Insufficient documentation

## 2021-05-23 DIAGNOSIS — N898 Other specified noninflammatory disorders of vagina: Secondary | ICD-10-CM | POA: Insufficient documentation

## 2021-05-23 DIAGNOSIS — E559 Vitamin D deficiency, unspecified: Secondary | ICD-10-CM | POA: Diagnosis not present

## 2021-05-23 DIAGNOSIS — Z79899 Other long term (current) drug therapy: Secondary | ICD-10-CM | POA: Diagnosis not present

## 2021-05-23 DIAGNOSIS — R1904 Left lower quadrant abdominal swelling, mass and lump: Secondary | ICD-10-CM | POA: Diagnosis not present

## 2021-05-23 DIAGNOSIS — Z1231 Encounter for screening mammogram for malignant neoplasm of breast: Secondary | ICD-10-CM | POA: Diagnosis not present

## 2021-05-23 DIAGNOSIS — N3946 Mixed incontinence: Secondary | ICD-10-CM | POA: Diagnosis not present

## 2021-05-23 LAB — HM MAMMOGRAPHY

## 2021-06-20 DIAGNOSIS — Z008 Encounter for other general examination: Secondary | ICD-10-CM | POA: Diagnosis not present

## 2021-06-24 DIAGNOSIS — R1904 Left lower quadrant abdominal swelling, mass and lump: Secondary | ICD-10-CM | POA: Diagnosis not present

## 2021-06-27 DIAGNOSIS — Z0100 Encounter for examination of eyes and vision without abnormal findings: Secondary | ICD-10-CM | POA: Diagnosis not present

## 2021-06-27 DIAGNOSIS — H43393 Other vitreous opacities, bilateral: Secondary | ICD-10-CM | POA: Diagnosis not present

## 2021-07-25 DIAGNOSIS — R1904 Left lower quadrant abdominal swelling, mass and lump: Secondary | ICD-10-CM | POA: Diagnosis not present

## 2021-07-25 DIAGNOSIS — G4733 Obstructive sleep apnea (adult) (pediatric): Secondary | ICD-10-CM | POA: Diagnosis not present

## 2021-08-12 DIAGNOSIS — M47815 Spondylosis without myelopathy or radiculopathy, thoracolumbar region: Secondary | ICD-10-CM | POA: Diagnosis not present

## 2021-08-12 DIAGNOSIS — M4319 Spondylolisthesis, multiple sites in spine: Secondary | ICD-10-CM | POA: Diagnosis not present

## 2021-08-12 DIAGNOSIS — M4316 Spondylolisthesis, lumbar region: Secondary | ICD-10-CM | POA: Diagnosis not present

## 2021-08-12 DIAGNOSIS — K439 Ventral hernia without obstruction or gangrene: Secondary | ICD-10-CM | POA: Diagnosis not present

## 2021-08-12 DIAGNOSIS — K5730 Diverticulosis of large intestine without perforation or abscess without bleeding: Secondary | ICD-10-CM | POA: Diagnosis not present

## 2021-08-12 DIAGNOSIS — M2578 Osteophyte, vertebrae: Secondary | ICD-10-CM | POA: Diagnosis not present

## 2021-08-15 DIAGNOSIS — R1904 Left lower quadrant abdominal swelling, mass and lump: Secondary | ICD-10-CM | POA: Diagnosis not present

## 2021-08-15 DIAGNOSIS — G4733 Obstructive sleep apnea (adult) (pediatric): Secondary | ICD-10-CM | POA: Diagnosis not present

## 2021-08-26 DIAGNOSIS — Z124 Encounter for screening for malignant neoplasm of cervix: Secondary | ICD-10-CM | POA: Insufficient documentation

## 2021-08-26 DIAGNOSIS — W57XXXA Bitten or stung by nonvenomous insect and other nonvenomous arthropods, initial encounter: Secondary | ICD-10-CM | POA: Insufficient documentation

## 2021-09-03 ENCOUNTER — Other Ambulatory Visit (HOSPITAL_BASED_OUTPATIENT_CLINIC_OR_DEPARTMENT_OTHER): Payer: Self-pay

## 2021-09-03 ENCOUNTER — Emergency Department (HOSPITAL_BASED_OUTPATIENT_CLINIC_OR_DEPARTMENT_OTHER): Payer: Medicare HMO

## 2021-09-03 ENCOUNTER — Encounter (HOSPITAL_BASED_OUTPATIENT_CLINIC_OR_DEPARTMENT_OTHER): Payer: Self-pay | Admitting: Emergency Medicine

## 2021-09-03 ENCOUNTER — Emergency Department (HOSPITAL_BASED_OUTPATIENT_CLINIC_OR_DEPARTMENT_OTHER)
Admission: EM | Admit: 2021-09-03 | Discharge: 2021-09-03 | Disposition: A | Discharge status: Home / Self Care | Payer: Medicare HMO | Attending: Emergency Medicine | Admitting: Emergency Medicine

## 2021-09-03 ENCOUNTER — Other Ambulatory Visit: Payer: Self-pay

## 2021-09-03 DIAGNOSIS — Z87891 Personal history of nicotine dependence: Secondary | ICD-10-CM | POA: Diagnosis not present

## 2021-09-03 DIAGNOSIS — K5792 Diverticulitis of intestine, part unspecified, without perforation or abscess without bleeding: Secondary | ICD-10-CM

## 2021-09-03 DIAGNOSIS — K5732 Diverticulitis of large intestine without perforation or abscess without bleeding: Secondary | ICD-10-CM | POA: Insufficient documentation

## 2021-09-03 DIAGNOSIS — K469 Unspecified abdominal hernia without obstruction or gangrene: Secondary | ICD-10-CM | POA: Diagnosis not present

## 2021-09-03 DIAGNOSIS — R1031 Right lower quadrant pain: Secondary | ICD-10-CM | POA: Diagnosis not present

## 2021-09-03 DIAGNOSIS — K439 Ventral hernia without obstruction or gangrene: Secondary | ICD-10-CM | POA: Insufficient documentation

## 2021-09-03 LAB — COMPREHENSIVE METABOLIC PANEL
ALT: 16 U/L (ref 0–44)
AST: 20 U/L (ref 15–41)
Albumin: 3.7 g/dL (ref 3.5–5.0)
Alkaline Phosphatase: 74 U/L (ref 38–126)
Anion gap: 7 (ref 5–15)
BUN: 9 mg/dL (ref 8–23)
CO2: 29 mmol/L (ref 22–32)
Calcium: 8.8 mg/dL — ABNORMAL LOW (ref 8.9–10.3)
Chloride: 104 mmol/L (ref 98–111)
Creatinine, Ser: 0.64 mg/dL (ref 0.44–1.00)
GFR, Estimated: >60 mL/min (ref >60)
Glucose, Bld: 102 mg/dL — ABNORMAL HIGH (ref 70–99)
Potassium: 3.8 mmol/L (ref 3.5–5.1)
Sodium: 140 mmol/L (ref 135–145)
Total Bilirubin: 1.3 mg/dL — ABNORMAL HIGH (ref 0.3–1.2)
Total Protein: 7 g/dL (ref 6.5–8.1)

## 2021-09-03 LAB — CBC
HCT: 43.1 % (ref 36.0–46.0)
Hemoglobin: 14.5 g/dL (ref 12.0–15.0)
MCH: 30.9 pg (ref 26.0–34.0)
MCHC: 33.6 g/dL (ref 30.0–36.0)
MCV: 91.7 fL (ref 80.0–100.0)
Platelets: 309 10*3/uL (ref 150–400)
RBC: 4.7 MIL/uL (ref 3.87–5.11)
RDW: 13.2 % (ref 11.5–15.5)
WBC: 10.6 10*3/uL — ABNORMAL HIGH (ref 4.0–10.5)
nRBC: 0 % (ref 0.0–0.2)

## 2021-09-03 LAB — LIPASE, BLOOD: Lipase: 26 U/L (ref 11–51)

## 2021-09-03 MED ORDER — IOHEXOL 300 MG/ML  SOLN
100.0000 mL | Freq: Once | INTRAMUSCULAR | Status: AC | PRN
  Administered 2021-09-03: 100 mL via INTRAVENOUS

## 2021-09-03 MED ORDER — AMOXICILLIN-POT CLAVULANATE 875-125 MG PO TABS
1.0000 | ORAL_TABLET | Freq: Two times a day (BID) | ORAL | 0 refills | Status: DC
  Filled 2021-09-03: qty 14, 7d supply, fill #0

## 2021-09-03 NOTE — Discharge Instructions (Addendum)
Take the antibiotic Augmentin as directed for the next 7 days.  Return for any new or worse symptoms.  Would expect you to be improved over the next couple days.  [facility] probably going to have to delay your left-sided hernia repair.

## 2021-09-03 NOTE — ED Provider Notes (Signed)
[facility] EMERGENCY DEPARTMENT Provider Note   CSN: [id number] Arrival date & time: 09/03/21  4098     History  Chief Complaint  Patient presents with   Abdominal Pain    April Bowman is a 66 y.o. female.  Patient with a diagnosis of a left-sided spit Gallion hernia.  Followed by [facility] surgery.  Surgical repair planned for this Friday.  Patient with onset of right-sided abdominal pain right lower quadrant abdominal pain for the past 2 days.  Worse with taking a deep breath but definitely in the abdomen area tender to push on the right side of the abdomen.  No nausea vomiting or diarrhea.  No fevers.  Past medical history noncontributory patient is a former smoker quit in 2012.       Home Medications Prior to Admission medications   Medication Sig Start Date End Date Taking? Authorizing Provider  amoxicillin-clavulanate (AUGMENTIN) 875-125 MG tablet Take 1 tablet by mouth every 12 (twelve) hours. 09/03/21  Yes Vanetta Mulders, MD  COVID-19 mRNA bivalent vaccine, Pfizer, (PFIZER COVID-19 VAC BIVALENT) injection Inject into the muscle. 02/20/21   Judyann Munson, MD  [employer] D3 1000 units capsule Take 1,000 Units by mouth daily. 05/12/17   [provider]  estradiol (ESTRACE VAGINAL) 0.1 MG/GM vaginal cream INSERT 1 GRAM VAGINALLY 3 TIMES PER WEEK 05/02/16   [provider]  ibuprofen (ADVIL,MOTRIN) 800 MG tablet Take 800 mg by mouth 3 (three) times daily as needed. 04/20/17   [provider]  MYRBETRIQ 50 MG TB24 tablet Take 50 mg by mouth daily. 05/08/17   [provider]  nortriptyline (PAMELOR) 75 MG capsule Take 75 mg by mouth at bedtime. 05/08/17   [provider]      Allergies    Patient has no known allergies.    Review of Systems   Review of Systems  Constitutional:  Negative for chills and fever.  HENT:  Negative for ear pain and sore throat.   Eyes:  Negative for pain and visual disturbance.  Respiratory:   Negative for cough and shortness of breath.   Cardiovascular:  Negative for chest pain and palpitations.  Gastrointestinal:  Positive for abdominal pain. Negative for vomiting.  Genitourinary:  Negative for dysuria and hematuria.  Musculoskeletal:  Negative for arthralgias and back pain.  Skin:  Negative for color change and rash.  Neurological:  Negative for seizures and syncope.  All other systems reviewed and are negative.   Physical Exam Updated Vital Signs BP 134/75   Pulse 71   Temp 98.8 F (37.1 C) (Oral)   Resp 14   Ht 1.549 m (5\' 1" )   Wt 59 kg   SpO2 99%   BMI 24.56 kg/m  Physical Exam Vitals and nursing note reviewed.  Constitutional:      General: She is not in acute distress.    Appearance: She is well-developed.  HENT:     Head: Normocephalic and atraumatic.  Eyes:     Conjunctiva/sclera: Conjunctivae normal.  Cardiovascular:     Rate and Rhythm: Normal rate and regular rhythm.     Heart sounds: No murmur heard. Pulmonary:     Effort: Pulmonary effort is normal. No respiratory distress.     Breath sounds: Normal breath sounds.  Abdominal:     General: Abdomen is flat. Bowel sounds are normal.     Palpations: Abdomen is soft.     Tenderness: There is abdominal tenderness in the right upper quadrant and right lower  quadrant. There is no guarding.     Hernia: A hernia is present. Hernia is present in the ventral area.     Comments: Patient with a easily reducible left-sided hernia probably spit Gallion.  Not inguinal.  Tenderness to palpation right lower quadrant but right upper quadrant greater then lower quadrant.  Musculoskeletal:        General: No swelling.     Cervical back: Neck supple.  Skin:    General: Skin is warm and dry.     Capillary Refill: Capillary refill takes less than 2 seconds.  Neurological:     Mental Status: She is alert.  Psychiatric:        Mood and Affect: Mood normal.     ED Results / Procedures / Treatments   Labs (all  labs ordered are listed, but only abnormal results are displayed) Labs Reviewed  COMPREHENSIVE METABOLIC PANEL - Abnormal; Notable for the following components:      Result Value   Glucose, Bld 102 (*)    Calcium 8.8 (*)    Total Bilirubin 1.3 (*)    All other components within normal limits  CBC - Abnormal; Notable for the following components:   WBC 10.6 (*)    All other components within normal limits  LIPASE, BLOOD    EKG None  Radiology CT Abdomen Pelvis W Contrast  Result Date: 09/03/2021 CLINICAL DATA:  66 year old female with history of right lower quadrant abdominal pain. EXAM: CT ABDOMEN AND PELVIS WITH CONTRAST TECHNIQUE: Multidetector CT imaging of the abdomen and pelvis was performed using the standard protocol following bolus administration of intravenous contrast. RADIATION DOSE REDUCTION: This exam was performed according to the departmental dose-optimization program which includes automated exposure control, adjustment of the mA and/or kV according to patient size and/or use of iterative reconstruction technique. CONTRAST:  OMNIPAQUE IOHEXOL 300 MG/ML  SOLN COMPARISON:  CT of the abdomen and pelvis 08/12/2021. FINDINGS: Lower chest: Pectus excavatum. Atherosclerotic calcifications in the descending thoracic aorta as well as the right coronary artery. Hepatobiliary: No suspicious cystic or solid hepatic lesions. No intra or extrahepatic biliary ductal dilatation. Gallbladder is normal in appearance. Pancreas: No pancreatic mass. No pancreatic ductal dilatation. No pancreatic or peripancreatic fluid collections or inflammatory changes. Spleen: Unremarkable. Adrenals/Urinary Tract: Bilateral kidneys and adrenal glands are normal in appearance. No hydroureteronephrosis. Urinary bladder is largely obscured by beam hardening artifact from bilateral hip arthroplasties. Stomach/Bowel: The appearance of the stomach is normal. There is no pathologic dilatation of small bowel or  colon. Numerous colonic diverticulae are noted. In the region of the ascending colon and hepatic flexure there are surrounding inflammatory changes concerning for an acute diverticulitis. No discrete diverticular abscess confidently identified at this time. No definitive signs of perforation. Normal appendix. Previously noted small lower left sided Spigelian hernia not readily apparent on today's examination, presumably reduced in the interval. Vascular/Lymphatic: Aortic atherosclerosis, without evidence of aneurysm or dissection in the abdominal or pelvic vasculature. No lymphadenopathy noted in the abdomen or pelvis. Reproductive: Uterus and ovaries are largely obscured by beam hardening artifact from the patient's bilateral hip arthroplasties. Other: No significant volume of ascites.  No pneumoperitoneum. Musculoskeletal: Bilateral hip arthroplasties. There are no aggressive appearing lytic or blastic lesions noted in the visualized portions of the skeleton. IMPRESSION: 1. Extensive inflammatory changes adjacent to the ascending colon and hepatic flexure, presumably from acute diverticulitis. No discrete diverticular abscess or definite findings of frank perforation are noted at this time. 2. Aortic atherosclerosis, in  addition to at least right coronary artery disease. Please note that although the presence of coronary artery calcium documents the presence of coronary artery disease, the severity of this disease and any potential stenosis cannot be assessed on this non-gated CT examination. Assessment for potential risk factor modification, dietary therapy or pharmacologic therapy may be warranted, if clinically indicated. 3. Additional incidental findings, as above. Electronically Signed   By: Trudie Reed M.D.   On: 09/03/2021 09:21    Procedures Procedures    Medications Ordered in ED Medications  iohexol (OMNIPAQUE) 300 MG/ML solution 100 mL (100 mLs Intravenous Contrast Given 09/03/21 0849)     ED Course/ Medical Decision Making/ A&P                           Medical Decision Making Amount and/or Complexity of Data Reviewed Labs: ordered. Radiology: ordered.  Risk Prescription drug management.   CT scan shows evidence of diverticulitis in the hepatic flexure part of the colon.  This would explain symptoms.  No complicating factors with this but Gallion hernia on the left side.  Patient nontoxic no acute distress.  Lipase normal.  Liver function test normal other than a bilirubin of 1.3 renal functions normal with a GFR greater than 60 CBC mild leukocytosis with a white count of 10.6.  Hemoglobin 14.5.  Platelets are normal.  Will treat with Augmentin.  Patient will contact her surgeons because of probably have to delay her hernia repair scheduled for Friday.  Patient given precautions to return if she does not improve or for any new or worse symptoms.  CT scan showed no complicating factors associated with the diverticulitis. Final Clinical Impression(s) / ED Diagnoses Final diagnoses:  Diverticulitis  Abdominal wall hernia    Rx / DC Orders ED Discharge Orders          Ordered    amoxicillin-clavulanate (AUGMENTIN) 875-125 MG tablet  Every 12 hours        09/03/21 0953              Vanetta Mulders, MD 09/03/21 1005

## 2021-09-03 NOTE — ED Triage Notes (Signed)
RLQ pain , felt bloated  x 2 days . Worse today with deep breathing . Schedules abdominal surgery this coming Friday .  Denies NVD.  Denies urinary symptoms

## 2021-09-11 DIAGNOSIS — M79661 Pain in right lower leg: Secondary | ICD-10-CM | POA: Diagnosis not present

## 2021-09-11 DIAGNOSIS — R2241 Localized swelling, mass and lump, right lower limb: Secondary | ICD-10-CM | POA: Diagnosis not present

## 2021-10-15 DIAGNOSIS — Z01810 Encounter for preprocedural cardiovascular examination: Secondary | ICD-10-CM | POA: Diagnosis not present

## 2021-10-15 DIAGNOSIS — K439 Ventral hernia without obstruction or gangrene: Secondary | ICD-10-CM | POA: Diagnosis not present

## 2021-10-15 DIAGNOSIS — Z01812 Encounter for preprocedural laboratory examination: Secondary | ICD-10-CM | POA: Diagnosis not present

## 2021-10-15 DIAGNOSIS — I4510 Unspecified right bundle-branch block: Secondary | ICD-10-CM | POA: Diagnosis not present

## 2021-10-18 DIAGNOSIS — K439 Ventral hernia without obstruction or gangrene: Secondary | ICD-10-CM | POA: Diagnosis not present

## 2021-10-18 DIAGNOSIS — K4090 Unilateral inguinal hernia, without obstruction or gangrene, not specified as recurrent: Secondary | ICD-10-CM | POA: Diagnosis not present

## 2021-11-27 DIAGNOSIS — N898 Other specified noninflammatory disorders of vagina: Secondary | ICD-10-CM | POA: Diagnosis not present

## 2021-11-27 DIAGNOSIS — Z23 Encounter for immunization: Secondary | ICD-10-CM | POA: Diagnosis not present

## 2021-11-27 DIAGNOSIS — G4700 Insomnia, unspecified: Secondary | ICD-10-CM | POA: Diagnosis not present

## 2021-11-27 DIAGNOSIS — G4733 Obstructive sleep apnea (adult) (pediatric): Secondary | ICD-10-CM | POA: Diagnosis not present

## 2021-12-31 DIAGNOSIS — G4733 Obstructive sleep apnea (adult) (pediatric): Secondary | ICD-10-CM | POA: Diagnosis not present

## 2022-01-15 DIAGNOSIS — G5603 Carpal tunnel syndrome, bilateral upper limbs: Secondary | ICD-10-CM | POA: Diagnosis not present

## 2022-01-15 DIAGNOSIS — E559 Vitamin D deficiency, unspecified: Secondary | ICD-10-CM | POA: Diagnosis not present

## 2022-01-15 DIAGNOSIS — R7989 Other specified abnormal findings of blood chemistry: Secondary | ICD-10-CM | POA: Diagnosis not present

## 2022-01-15 DIAGNOSIS — Z0000 Encounter for general adult medical examination without abnormal findings: Secondary | ICD-10-CM | POA: Diagnosis not present

## 2022-02-04 DIAGNOSIS — M1990 Unspecified osteoarthritis, unspecified site: Secondary | ICD-10-CM | POA: Diagnosis not present

## 2022-02-04 DIAGNOSIS — M25512 Pain in left shoulder: Secondary | ICD-10-CM | POA: Diagnosis not present

## 2022-02-04 DIAGNOSIS — M25511 Pain in right shoulder: Secondary | ICD-10-CM | POA: Diagnosis not present

## 2022-02-04 DIAGNOSIS — M79672 Pain in left foot: Secondary | ICD-10-CM | POA: Diagnosis not present

## 2022-02-17 DIAGNOSIS — M25512 Pain in left shoulder: Secondary | ICD-10-CM | POA: Diagnosis not present

## 2022-02-17 DIAGNOSIS — M778 Other enthesopathies, not elsewhere classified: Secondary | ICD-10-CM | POA: Insufficient documentation

## 2022-02-17 DIAGNOSIS — M7522 Bicipital tendinitis, left shoulder: Secondary | ICD-10-CM | POA: Diagnosis not present

## 2022-02-17 DIAGNOSIS — M7521 Bicipital tendinitis, right shoulder: Secondary | ICD-10-CM | POA: Insufficient documentation

## 2022-02-17 DIAGNOSIS — M25511 Pain in right shoulder: Secondary | ICD-10-CM | POA: Insufficient documentation

## 2022-02-17 DIAGNOSIS — M1990 Unspecified osteoarthritis, unspecified site: Secondary | ICD-10-CM | POA: Diagnosis not present

## 2022-02-20 DIAGNOSIS — M7522 Bicipital tendinitis, left shoulder: Secondary | ICD-10-CM | POA: Diagnosis not present

## 2022-02-20 DIAGNOSIS — M25512 Pain in left shoulder: Secondary | ICD-10-CM | POA: Diagnosis not present

## 2022-02-20 DIAGNOSIS — M25611 Stiffness of right shoulder, not elsewhere classified: Secondary | ICD-10-CM | POA: Diagnosis not present

## 2022-02-20 DIAGNOSIS — M25511 Pain in right shoulder: Secondary | ICD-10-CM | POA: Diagnosis not present

## 2022-02-20 DIAGNOSIS — M25612 Stiffness of left shoulder, not elsewhere classified: Secondary | ICD-10-CM | POA: Diagnosis not present

## 2022-02-20 DIAGNOSIS — M778 Other enthesopathies, not elsewhere classified: Secondary | ICD-10-CM | POA: Diagnosis not present

## 2022-02-20 DIAGNOSIS — R29898 Other symptoms and signs involving the musculoskeletal system: Secondary | ICD-10-CM | POA: Diagnosis not present

## 2022-02-25 DIAGNOSIS — R159 Full incontinence of feces: Secondary | ICD-10-CM | POA: Diagnosis not present

## 2022-02-25 DIAGNOSIS — N3946 Mixed incontinence: Secondary | ICD-10-CM | POA: Diagnosis not present

## 2022-02-25 DIAGNOSIS — N3281 Overactive bladder: Secondary | ICD-10-CM | POA: Diagnosis not present

## 2022-02-25 DIAGNOSIS — N958 Other specified menopausal and perimenopausal disorders: Secondary | ICD-10-CM | POA: Diagnosis not present

## 2022-04-14 DIAGNOSIS — M7522 Bicipital tendinitis, left shoulder: Secondary | ICD-10-CM | POA: Diagnosis not present

## 2022-04-14 DIAGNOSIS — M7582 Other shoulder lesions, left shoulder: Secondary | ICD-10-CM | POA: Insufficient documentation

## 2022-04-14 DIAGNOSIS — M778 Other enthesopathies, not elsewhere classified: Secondary | ICD-10-CM | POA: Diagnosis not present

## 2022-04-14 DIAGNOSIS — M7521 Bicipital tendinitis, right shoulder: Secondary | ICD-10-CM | POA: Diagnosis not present

## 2022-04-29 DIAGNOSIS — M7582 Other shoulder lesions, left shoulder: Secondary | ICD-10-CM | POA: Diagnosis not present

## 2022-04-29 DIAGNOSIS — M7522 Bicipital tendinitis, left shoulder: Secondary | ICD-10-CM | POA: Diagnosis not present

## 2022-04-29 DIAGNOSIS — S46012A Strain of muscle(s) and tendon(s) of the rotator cuff of left shoulder, initial encounter: Secondary | ICD-10-CM | POA: Diagnosis not present

## 2022-04-30 DIAGNOSIS — G4733 Obstructive sleep apnea (adult) (pediatric): Secondary | ICD-10-CM | POA: Diagnosis not present

## 2022-04-30 DIAGNOSIS — M545 Low back pain, unspecified: Secondary | ICD-10-CM | POA: Diagnosis not present

## 2022-04-30 DIAGNOSIS — G4709 Other insomnia: Secondary | ICD-10-CM | POA: Diagnosis not present

## 2022-05-13 DIAGNOSIS — M7582 Other shoulder lesions, left shoulder: Secondary | ICD-10-CM | POA: Diagnosis not present

## 2022-05-13 DIAGNOSIS — M75122 Complete rotator cuff tear or rupture of left shoulder, not specified as traumatic: Secondary | ICD-10-CM | POA: Diagnosis not present

## 2022-05-20 ENCOUNTER — Encounter (HOSPITAL_COMMUNITY): Admission: EM | Disposition: A | Discharge status: Home Health | Payer: Self-pay | Source: Home / Self Care

## 2022-05-20 ENCOUNTER — Emergency Department (HOSPITAL_COMMUNITY): Payer: Medicare HMO

## 2022-05-20 ENCOUNTER — Observation Stay (HOSPITAL_COMMUNITY): Payer: Medicare HMO

## 2022-05-20 ENCOUNTER — Inpatient Hospital Stay (HOSPITAL_COMMUNITY)
Admission: EM | Admit: 2022-05-20 | Discharge: 2022-05-28 | DRG: 577 | Disposition: A | Discharge status: Home Health | Payer: Medicare HMO | Attending: Surgery | Admitting: Surgery

## 2022-05-20 ENCOUNTER — Other Ambulatory Visit: Payer: Self-pay

## 2022-05-20 DIAGNOSIS — S59812A Other specified injuries left forearm, initial encounter: Secondary | ICD-10-CM | POA: Diagnosis not present

## 2022-05-20 DIAGNOSIS — S41152A Open bite of left upper arm, initial encounter: Secondary | ICD-10-CM | POA: Diagnosis present

## 2022-05-20 DIAGNOSIS — S51811A Laceration without foreign body of right forearm, initial encounter: Secondary | ICD-10-CM | POA: Diagnosis present

## 2022-05-20 DIAGNOSIS — S81032A Puncture wound without foreign body, left knee, initial encounter: Secondary | ICD-10-CM | POA: Diagnosis present

## 2022-05-20 DIAGNOSIS — S51802A Unspecified open wound of left forearm, initial encounter: Secondary | ICD-10-CM | POA: Diagnosis not present

## 2022-05-20 DIAGNOSIS — Z96643 Presence of artificial hip joint, bilateral: Secondary | ICD-10-CM | POA: Diagnosis present

## 2022-05-20 DIAGNOSIS — Z743 Need for continuous supervision: Secondary | ICD-10-CM | POA: Diagnosis not present

## 2022-05-20 DIAGNOSIS — Z79890 Hormone replacement therapy (postmenopausal): Secondary | ICD-10-CM

## 2022-05-20 DIAGNOSIS — T1490XA Injury, unspecified, initial encounter: Secondary | ICD-10-CM | POA: Diagnosis not present

## 2022-05-20 DIAGNOSIS — S51851A Open bite of right forearm, initial encounter: Secondary | ICD-10-CM | POA: Diagnosis not present

## 2022-05-20 DIAGNOSIS — S21159A Open bite of unspecified front wall of thorax without penetration into thoracic cavity, initial encounter: Secondary | ICD-10-CM | POA: Diagnosis not present

## 2022-05-20 DIAGNOSIS — W540XXA Bitten by dog, initial encounter: Principal | ICD-10-CM | POA: Diagnosis present

## 2022-05-20 DIAGNOSIS — I10 Essential (primary) hypertension: Secondary | ICD-10-CM | POA: Diagnosis not present

## 2022-05-20 DIAGNOSIS — D62 Acute posthemorrhagic anemia: Secondary | ICD-10-CM | POA: Diagnosis not present

## 2022-05-20 DIAGNOSIS — S51852A Open bite of left forearm, initial encounter: Secondary | ICD-10-CM | POA: Diagnosis not present

## 2022-05-20 DIAGNOSIS — F430 Acute stress reaction: Secondary | ICD-10-CM | POA: Diagnosis present

## 2022-05-20 DIAGNOSIS — Z23 Encounter for immunization: Secondary | ICD-10-CM

## 2022-05-20 DIAGNOSIS — S52292A Other fracture of shaft of left ulna, initial encounter for closed fracture: Secondary | ICD-10-CM | POA: Diagnosis not present

## 2022-05-20 DIAGNOSIS — S52252A Displaced comminuted fracture of shaft of ulna, left arm, initial encounter for closed fracture: Secondary | ICD-10-CM | POA: Diagnosis not present

## 2022-05-20 DIAGNOSIS — Z79899 Other long term (current) drug therapy: Secondary | ICD-10-CM

## 2022-05-20 DIAGNOSIS — S81811A Laceration without foreign body, right lower leg, initial encounter: Secondary | ICD-10-CM | POA: Diagnosis present

## 2022-05-20 DIAGNOSIS — W5581XA Bitten by other mammals, initial encounter: Secondary | ICD-10-CM | POA: Diagnosis not present

## 2022-05-20 DIAGNOSIS — S31050A Open bite of lower back and pelvis without penetration into retroperitoneum, initial encounter: Secondary | ICD-10-CM | POA: Diagnosis not present

## 2022-05-20 DIAGNOSIS — S41151A Open bite of right upper arm, initial encounter: Secondary | ICD-10-CM | POA: Diagnosis not present

## 2022-05-20 DIAGNOSIS — Z87891 Personal history of nicotine dependence: Secondary | ICD-10-CM

## 2022-05-20 DIAGNOSIS — S52601A Unspecified fracture of lower end of right ulna, initial encounter for closed fracture: Secondary | ICD-10-CM | POA: Diagnosis present

## 2022-05-20 DIAGNOSIS — G4733 Obstructive sleep apnea (adult) (pediatric): Secondary | ICD-10-CM | POA: Diagnosis present

## 2022-05-20 DIAGNOSIS — S42402A Unspecified fracture of lower end of left humerus, initial encounter for closed fracture: Secondary | ICD-10-CM | POA: Diagnosis not present

## 2022-05-20 DIAGNOSIS — S81851A Open bite, right lower leg, initial encounter: Secondary | ICD-10-CM | POA: Diagnosis not present

## 2022-05-20 DIAGNOSIS — S81852A Open bite, left lower leg, initial encounter: Secondary | ICD-10-CM | POA: Diagnosis not present

## 2022-05-20 DIAGNOSIS — S51831A Puncture wound without foreign body of right forearm, initial encounter: Secondary | ICD-10-CM | POA: Diagnosis present

## 2022-05-20 DIAGNOSIS — S52602A Unspecified fracture of lower end of left ulna, initial encounter for closed fracture: Secondary | ICD-10-CM | POA: Diagnosis not present

## 2022-05-20 HISTORY — PX: INCISION AND DRAINAGE OF WOUND: SHX1803

## 2022-05-20 HISTORY — PX: MINOR IRRIGATION AND DEBRIDEMENT OF WOUND: SHX6239

## 2022-05-20 LAB — TYPE AND SCREEN
ABO/RH(D): O POS
Antibody Screen: NEGATIVE

## 2022-05-20 LAB — COMPREHENSIVE METABOLIC PANEL
ALT: 19 U/L (ref 0–44)
AST: 29 U/L (ref 15–41)
Albumin: 3.6 g/dL (ref 3.5–5.0)
Alkaline Phosphatase: 87 U/L (ref 38–126)
Anion gap: 17 — ABNORMAL HIGH (ref 5–15)
BUN: 16 mg/dL (ref 8–23)
CO2: 18 mmol/L — ABNORMAL LOW (ref 22–32)
Calcium: 9 mg/dL (ref 8.9–10.3)
Chloride: 100 mmol/L (ref 98–111)
Creatinine, Ser: 0.82 mg/dL (ref 0.44–1.00)
GFR, Estimated: >60 mL/min (ref >60)
Glucose, Bld: 135 mg/dL — ABNORMAL HIGH (ref 70–99)
Potassium: 4.1 mmol/L (ref 3.5–5.1)
Sodium: 135 mmol/L (ref 135–145)
Total Bilirubin: 0.4 mg/dL (ref 0.3–1.2)
Total Protein: 6.6 g/dL (ref 6.5–8.1)

## 2022-05-20 LAB — I-STAT CHEM 8, ED
BUN: 17 mg/dL (ref 8–23)
Calcium, Ion: 0.97 mmol/L — ABNORMAL LOW (ref 1.15–1.40)
Chloride: 103 mmol/L (ref 98–111)
Creatinine, Ser: 0.7 mg/dL (ref 0.44–1.00)
Glucose, Bld: 132 mg/dL — ABNORMAL HIGH (ref 70–99)
HCT: 42 % (ref 36.0–46.0)
Hemoglobin: 14.3 g/dL (ref 12.0–15.0)
Potassium: 4.1 mmol/L (ref 3.5–5.1)
Sodium: 135 mmol/L (ref 135–145)
TCO2: 18 mmol/L — ABNORMAL LOW (ref 22–32)

## 2022-05-20 LAB — ETHANOL: Alcohol, Ethyl (B): <10 mg/dL (ref <10)

## 2022-05-20 LAB — URINALYSIS, ROUTINE W REFLEX MICROSCOPIC
Bacteria, UA: NONE SEEN
Bilirubin Urine: NEGATIVE
Glucose, UA: NEGATIVE mg/dL
Ketones, ur: NEGATIVE mg/dL
Leukocytes,Ua: NEGATIVE
Nitrite: NEGATIVE
Protein, ur: NEGATIVE mg/dL
Specific Gravity, Urine: 1.009 (ref 1.005–1.030)
pH: 5 (ref 5.0–8.0)

## 2022-05-20 LAB — PROTIME-INR
INR: 0.9 (ref 0.8–1.2)
Prothrombin Time: 12.5 seconds (ref 11.4–15.2)

## 2022-05-20 LAB — CBC
HCT: 41.3 % (ref 36.0–46.0)
Hemoglobin: 14 g/dL (ref 12.0–15.0)
MCH: 31.2 pg (ref 26.0–34.0)
MCHC: 33.9 g/dL (ref 30.0–36.0)
MCV: 92 fL (ref 80.0–100.0)
Platelets: 352 10*3/uL (ref 150–400)
RBC: 4.49 MIL/uL (ref 3.87–5.11)
RDW: 13.2 % (ref 11.5–15.5)
WBC: 14.8 10*3/uL — ABNORMAL HIGH (ref 4.0–10.5)
nRBC: 0 % (ref 0.0–0.2)

## 2022-05-20 LAB — ABO/RH: ABO/RH(D): O POS

## 2022-05-20 LAB — LACTIC ACID, PLASMA: Lactic Acid, Venous: 6.1 mmol/L (ref 0.5–1.9)

## 2022-05-20 SURGERY — IRRIGATION AND DEBRIDEMENT WOUND
Anesthesia: General | Site: Arm Lower | Laterality: Right

## 2022-05-20 MED ORDER — FENTANYL CITRATE PF 50 MCG/ML IJ SOSY
100.0000 ug | PREFILLED_SYRINGE | Freq: Once | INTRAMUSCULAR | Status: AC
  Administered 2022-05-20: 100 ug via INTRAVENOUS

## 2022-05-20 MED ORDER — PROPOFOL 10 MG/ML IV BOLUS
INTRAVENOUS | Status: AC
  Filled 2022-05-20: qty 20

## 2022-05-20 MED ORDER — TETANUS-DIPHTH-ACELL PERTUSSIS 5-2.5-18.5 LF-MCG/0.5 IM SUSY
0.5000 mL | PREFILLED_SYRINGE | Freq: Once | INTRAMUSCULAR | Status: AC
  Administered 2022-05-20: 0.5 mL via INTRAMUSCULAR

## 2022-05-20 MED ORDER — MORPHINE SULFATE (PF) 4 MG/ML IV SOLN
4.0000 mg | Freq: Once | INTRAVENOUS | Status: AC
  Administered 2022-05-20: 4 mg via INTRAVENOUS
  Filled 2022-05-20: qty 1

## 2022-05-20 MED ORDER — SODIUM CHLORIDE 0.9 % IV SOLN: INTRAVENOUS | Status: DC

## 2022-05-20 MED ORDER — FENTANYL CITRATE (PF) 250 MCG/5ML IJ SOLN
INTRAMUSCULAR | Status: AC
  Filled 2022-05-20: qty 5

## 2022-05-20 MED ORDER — ACETAMINOPHEN 500 MG PO TABS
1000.0000 mg | ORAL_TABLET | Freq: Four times a day (QID) | ORAL | Status: DC
  Administered 2022-05-21 – 2022-05-28 (×24): 1000 mg via ORAL
  Filled 2022-05-20 (×27): qty 2

## 2022-05-20 MED ORDER — DOCUSATE SODIUM 100 MG PO CAPS
100.0000 mg | ORAL_CAPSULE | Freq: Two times a day (BID) | ORAL | Status: DC
  Administered 2022-05-21 – 2022-05-28 (×15): 100 mg via ORAL
  Filled 2022-05-20 (×15): qty 1

## 2022-05-20 MED ORDER — ACETAMINOPHEN 10 MG/ML IV SOLN
INTRAVENOUS | Status: AC
  Filled 2022-05-20: qty 100

## 2022-05-20 MED ORDER — ONDANSETRON 4 MG PO TBDP: 4.0000 mg | ORAL_TABLET | Freq: Four times a day (QID) | ORAL | Status: DC | PRN

## 2022-05-20 MED ORDER — FENTANYL CITRATE PF 50 MCG/ML IJ SOSY
PREFILLED_SYRINGE | INTRAMUSCULAR | Status: AC
  Filled 2022-05-20: qty 2

## 2022-05-20 MED ORDER — OXYCODONE HCL 5 MG PO TABS
10.0000 mg | ORAL_TABLET | ORAL | Status: DC | PRN
  Administered 2022-05-21 – 2022-05-23 (×9): 10 mg via ORAL
  Filled 2022-05-20 (×10): qty 2

## 2022-05-20 MED ORDER — ENOXAPARIN SODIUM 30 MG/0.3ML IJ SOSY
30.0000 mg | PREFILLED_SYRINGE | Freq: Two times a day (BID) | INTRAMUSCULAR | Status: DC
  Administered 2022-05-22 – 2022-05-27 (×12): 30 mg via SUBCUTANEOUS
  Filled 2022-05-20 (×13): qty 0.3

## 2022-05-20 MED ORDER — PIPERACILLIN-TAZOBACTAM 3.375 G IVPB 30 MIN
3.3750 g | Freq: Once | INTRAVENOUS | Status: AC
  Administered 2022-05-20: 3.375 g via INTRAVENOUS

## 2022-05-20 MED ORDER — HYDRALAZINE HCL 20 MG/ML IJ SOLN: 10.0000 mg | INTRAMUSCULAR | Status: DC | PRN

## 2022-05-20 MED ORDER — ONDANSETRON HCL 4 MG/2ML IJ SOLN
4.0000 mg | Freq: Four times a day (QID) | INTRAMUSCULAR | Status: DC | PRN
  Administered 2022-05-26: 4 mg via INTRAVENOUS
  Filled 2022-05-20: qty 2

## 2022-05-20 MED ORDER — TRAMADOL HCL 50 MG PO TABS
50.0000 mg | ORAL_TABLET | Freq: Four times a day (QID) | ORAL | Status: DC | PRN
  Administered 2022-05-21: 50 mg via ORAL
  Filled 2022-05-20: qty 1

## 2022-05-20 MED ORDER — METHOCARBAMOL 1000 MG/10ML IJ SOLN: 500.0000 mg | Freq: Four times a day (QID) | INTRAVENOUS | Status: DC | PRN

## 2022-05-20 MED ORDER — SODIUM CHLORIDE 0.9 % IV BOLUS
1000.0000 mL | Freq: Once | INTRAVENOUS | Status: AC
  Administered 2022-05-20: 1000 mL via INTRAVENOUS

## 2022-05-20 MED ORDER — OXYCODONE HCL 5 MG PO TABS: 5.0000 mg | ORAL_TABLET | ORAL | Status: DC | PRN

## 2022-05-20 MED ORDER — BISACODYL 10 MG RE SUPP: 10.0000 mg | Freq: Every day | RECTAL | Status: DC | PRN

## 2022-05-20 MED ORDER — MIDAZOLAM HCL 2 MG/2ML IJ SOLN
INTRAMUSCULAR | Status: AC
  Filled 2022-05-20: qty 2

## 2022-05-20 MED ORDER — HYDROMORPHONE HCL 1 MG/ML IJ SOLN
0.5000 mg | INTRAMUSCULAR | Status: DC | PRN
  Administered 2022-05-21 – 2022-05-23 (×2): 1 mg via INTRAVENOUS
  Filled 2022-05-20 (×3): qty 1

## 2022-05-20 MED ORDER — GABAPENTIN 300 MG PO CAPS
300.0000 mg | ORAL_CAPSULE | Freq: Three times a day (TID) | ORAL | Status: DC
  Administered 2022-05-21 – 2022-05-28 (×23): 300 mg via ORAL
  Filled 2022-05-20 (×23): qty 1

## 2022-05-20 MED ORDER — SODIUM CHLORIDE 0.9 % IV SOLN
3.0000 g | Freq: Four times a day (QID) | INTRAVENOUS | Status: DC
  Administered 2022-05-21 – 2022-05-28 (×29): 3 g via INTRAVENOUS
  Filled 2022-05-20 (×29): qty 8

## 2022-05-20 MED ORDER — METOPROLOL TARTRATE 5 MG/5ML IV SOLN: 5.0000 mg | Freq: Four times a day (QID) | INTRAVENOUS | Status: DC | PRN

## 2022-05-20 SURGICAL SUPPLY — 51 items
BAG COUNTER SPONGE SURGICOUNT (BAG) ×2 IMPLANT
BNDG ELASTIC 4INX 5YD STR LF (GAUZE/BANDAGES/DRESSINGS) IMPLANT
BNDG GAUZE DERMACEA FLUFF 4 (GAUZE/BANDAGES/DRESSINGS) IMPLANT
BOOTCOVER CLEANROOM LRG (PROTECTIVE WEAR) ×4 IMPLANT
CORD BIPOLAR FORCEPS 12FT (ELECTRODE) IMPLANT
COVER SURGICAL LIGHT HANDLE (MISCELLANEOUS) ×2 IMPLANT
DRAPE DERMATAC (DRAPES) IMPLANT
DRAPE U-SHAPE 47X51 STRL (DRAPES) ×2 IMPLANT
DRSG ADAPTIC 3X8 NADH LF (GAUZE/BANDAGES/DRESSINGS) IMPLANT
DRSG MEPILEX POST OP 4X8 (GAUZE/BANDAGES/DRESSINGS) IMPLANT
DRSG VAC GRANUFOAM LG (GAUZE/BANDAGES/DRESSINGS) IMPLANT
DURAPREP 26ML APPLICATOR (WOUND CARE) ×2 IMPLANT
ELECT REM PT RETURN 9FT ADLT (ELECTROSURGICAL)
ELECTRODE REM PT RTRN 9FT ADLT (ELECTROSURGICAL) IMPLANT
EVACUATOR 1/8 PVC DRAIN (DRAIN) IMPLANT
GAUZE 4X4 16PLY ~~LOC~~+RFID DBL (SPONGE) IMPLANT
GAUZE PAD ABD 8X10 STRL (GAUZE/BANDAGES/DRESSINGS) ×4 IMPLANT
GAUZE SPONGE 4X4 12PLY STRL (GAUZE/BANDAGES/DRESSINGS) ×2 IMPLANT
GAUZE XEROFORM 1X8 LF (GAUZE/BANDAGES/DRESSINGS) ×2 IMPLANT
GLOVE BIO SURGEON STRL SZ7.5 (GLOVE) ×2 IMPLANT
GLOVE BIO SURGEON STRL SZ8 (GLOVE) ×4 IMPLANT
GLOVE BIOGEL PI IND STRL 7.5 (GLOVE) ×2 IMPLANT
GLOVE BIOGEL PI IND STRL 8 (GLOVE) ×2 IMPLANT
GLOVE SURG ORTHO LTX SZ7.5 (GLOVE) ×4 IMPLANT
GOWN STRL REUS W/ TWL LRG LVL3 (GOWN DISPOSABLE) ×2 IMPLANT
GOWN STRL REUS W/TWL LRG LVL3 (GOWN DISPOSABLE) ×2
HANDPIECE INTERPULSE COAX TIP (DISPOSABLE)
KIT BASIN OR (CUSTOM PROCEDURE TRAY) ×2 IMPLANT
KIT TURNOVER KIT B (KITS) ×2 IMPLANT
MANIFOLD NEPTUNE II (INSTRUMENTS) ×2 IMPLANT
NS IRRIG 1000ML POUR BTL (IV SOLUTION) ×2 IMPLANT
PACK GENERAL/GYN (CUSTOM PROCEDURE TRAY) IMPLANT
PACK SHOULDER (CUSTOM PROCEDURE TRAY) ×2 IMPLANT
PAD ARMBOARD 7.5X6 YLW CONV (MISCELLANEOUS) ×4 IMPLANT
PADDING CAST ABS COTTON 4X4 ST (CAST SUPPLIES) IMPLANT
SET HNDPC FAN SPRY TIP SCT (DISPOSABLE) IMPLANT
SPONGE T-LAP 18X18 ~~LOC~~+RFID (SPONGE) ×2 IMPLANT
SUPPORT WRAP ARM LG (MISCELLANEOUS) IMPLANT
SUT ETHILON 2 0 FS 18 (SUTURE) IMPLANT
SUT VIC AB 0 CT1 27 (SUTURE)
SUT VIC AB 0 CT1 27XBRD ANBCTR (SUTURE) IMPLANT
SUT VIC AB 2-0 CT1 27 (SUTURE)
SUT VIC AB 2-0 CT1 TAPERPNT 27 (SUTURE) IMPLANT
SWAB COLLECTION DEVICE MRSA (MISCELLANEOUS) ×2 IMPLANT
SWAB CULTURE ESWAB REG 1ML (MISCELLANEOUS) IMPLANT
TOWEL GREEN STERILE (TOWEL DISPOSABLE) ×2 IMPLANT
TOWEL GREEN STERILE FF (TOWEL DISPOSABLE) ×2 IMPLANT
TUBE CONNECTING 12X1/4 (SUCTIONS) ×2 IMPLANT
UNDERPAD 30X36 HEAVY ABSORB (UNDERPADS AND DIAPERS) ×2 IMPLANT
WATER STERILE IRR 1000ML POUR (IV SOLUTION) ×2 IMPLANT
YANKAUER SUCT BULB TIP NO VENT (SUCTIONS) ×2 IMPLANT

## 2022-05-20 NOTE — Progress Notes (Signed)
Orthopedic Tech Progress Note Patient Details:  April Bowman 1955/06/10 [id number] Level 2 Trauma  Patient ID: [name], female   DOB: [birth year], 67 y.o.   MRN: [id number]  Smitty Pluck 05/20/2022, 9:46 PM

## 2022-05-20 NOTE — Progress Notes (Signed)
   05/20/22 2110  Spiritual Encounters  Type of Visit Initial  Care provided to: Pt not available  Referral source Trauma page  Reason for visit Trauma   Chaplain responded to a level two trauma. The patient, April Bowman, was attended to by the medical team. No family is present. If a chaplain is requested someone will respond.   Valerie Roys [facility]  [phone]

## 2022-05-20 NOTE — ED Triage Notes (Signed)
Chief Complaint  Patient presents with   Animal Bite   Pt presents to ED 34 via EMS as Level 2 Trauma for above.  Per report, pt bitten by medium-sized pitt bull.  Unsure of dog's shot records.  Messick MD at bedside.  Significant bite damage to upper left arm; bite wound to right arm.  Tourniquet in place by EMS to left arm; no arterial bleeding noted by ED provider.  Pt received 50 mg Fentanyl IM en route.

## 2022-05-20 NOTE — Anesthesia Preprocedure Evaluation (Addendum)
Anesthesia Evaluation  Patient identified by MRN, date of birth, ID band Patient awake    Reviewed: Allergy & Precautions, Patient's Chart, lab work & pertinent test results  Airway Mallampati: II  TM Distance: >3 FB     Dental  (+) Dental Advisory Given, Poor Dentition   Pulmonary sleep apnea and Continuous Positive Airway Pressure Ventilation , former smoker   Pulmonary exam normal        Cardiovascular negative cardio ROS  Rhythm:Regular Rate:Normal     Neuro/Psych negative neurological ROS  negative psych ROS   GI/Hepatic negative GI ROS, Neg liver ROS,,,  Endo/Other  negative endocrine ROS    Renal/GU negative Renal ROS  negative genitourinary   Musculoskeletal Extensive dog bite injuries to upper/lower extremity    Abdominal Normal abdominal exam  (+)   Peds  Hematology negative hematology ROS (+) Lab Results      Component                Value               Date                      WBC                      14.8 (H)            05/20/2022                HGB                      14.3                05/20/2022                HCT                      42.0                05/20/2022                MCV                      92.0                05/20/2022                PLT                      352                 05/20/2022             Lab Results      Component                Value               Date                      NA                       135                 05/20/2022                K  4.1                 05/20/2022                CO2                      18 (L)              05/20/2022                GLUCOSE                  132 (H)             05/20/2022                BUN                      17                  05/20/2022                CREATININE               0.70                05/20/2022                CALCIUM                  9.0                 05/20/2022                 GFRNONAA                 >60                 05/20/2022              Anesthesia Other Findings   Reproductive/Obstetrics                             Anesthesia Physical Anesthesia Plan  ASA: 2 and emergent  Anesthesia Plan: General   Post-op Pain Management: Ofirmev IV (intra-op)*   Induction: Intravenous  PONV Risk Score and Plan: 3 and Ondansetron, Dexamethasone, Midazolam and Treatment may vary due to age or medical condition  Airway Management Planned: Mask and Oral ETT  Additional Equipment: None  Intra-op Plan:   Post-operative Plan: Extubation in OR  Informed Consent: I have reviewed the patients History and Physical, chart, labs and discussed the procedure including the risks, benefits and alternatives for the proposed anesthesia with the patient or authorized representative who has indicated his/her understanding and acceptance.     Dental advisory given  Plan Discussed with: CRNA  Anesthesia Plan Comments:        Anesthesia Quick Evaluation

## 2022-05-20 NOTE — Progress Notes (Signed)
Pharmacy Antibiotic Note  April Bowman is a 67 y.o. female admitted on 05/20/2022 with  dog bite wound .  Pharmacy has been consulted for Unasyn dosing.  Patient presented with extensive open wounds from dog bite. Patient to undergo surgery. WBC 14.8 and currently afebrile. Patient received one dose of Zosyn.  Plan: Start Unasyn 3g every 6 hours F/u clinical progress to determine duration  Height: 5\' 1"  (154.9 cm) Weight: 59 kg (130 lb 1.1 oz) IBW/kg (Calculated) : 47.8  Temp (24hrs), Avg:98.6 F (37 C), Min:98.6 F (37 C), Max:98.6 F (37 C)  Recent Labs  Lab 05/20/22 2101 05/20/22 2110  WBC 14.8*  --   CREATININE 0.82 0.70  LATICACIDVEN 6.1*  --     Estimated Creatinine Clearance: 57.1 mL/min (by C-G formula based on SCr of 0.7 mg/dL).    No Known Allergies  Antimicrobials this admission: Zosyn x 1 5/7 Unasyn 5/8 >>   Thank you for involving pharmacy in this patient's care.  Enos Fling, PharmD PGY2 Pharmacy Resident 05/20/2022 10:53 PM

## 2022-05-20 NOTE — Consult Note (Signed)
Orthopaedic Surgery Hand and Upper Extremity History and Physical Examination 05/20/2022  Referring Provider: No referring provider defined for this encounter.  CC: Dog bite  HPI: April Bowman is a 67 y.o. female who sustained multiple dog bites to the extremities.  The Left upper extremity sustained severe injury with soft tissue defect.    Past Medical History: No past medical history on file.   Medications: Scheduled Meds:  acetaminophen  1,000 mg Oral Q6H   docusate sodium  100 mg Oral BID   [START ON 05/22/2022] enoxaparin (LOVENOX) injection  30 mg Subcutaneous Q12H   gabapentin  300 mg Oral TID   Continuous Infusions:  sodium chloride     [START ON 05/21/2022] ampicillin-sulbactam (UNASYN) IV     methocarbamol (ROBAXIN) IV     PRN Meds:.bisacodyl, hydrALAZINE, HYDROmorphone (DILAUDID) injection, methocarbamol (ROBAXIN) IV, metoprolol tartrate, ondansetron **OR** ondansetron (ZOFRAN) IV, oxyCODONE, oxyCODONE, traMADol  Allergies: Allergies as of 05/20/2022   (No Known Allergies)    Past Surgical History: Past Surgical History:  Procedure Laterality Date   AUGMENTATION MAMMAPLASTY     hip replacements Bilateral      Social History: Social History   Occupational History   Not on file  Tobacco Use   Smoking status: Former    Packs/day: 0.50    Years: 35.00    Additional pack years: 0.00    Total pack years: 17.50    Types: Cigarettes    Quit date: 04/03/2010    Years since quitting: 12.1   Smokeless tobacco: Never  Substance and Sexual Activity   Alcohol use: Not on file   Drug use: Not on file   Sexual activity: Not on file     Family History: Family History  Problem Relation Age of Onset   Breast cancer Neg Hx    Otherwise, no relevant orthopaedic family history  ROS: Review of Systems: All systems reviewed and are negative except that mentioned in HPI  Work/Sport/Hobbies: See HPI  Physical Examination: Vitals:   05/20/22 2230 05/20/22  2245  BP: (!) 163/89 (!) 148/85  Pulse: 86 82  Resp: 17 17  Temp:    SpO2: 97% 97%   Constitutional: Awake, alert.  WN/WD Affect: Normal HEENT: EOMI, mucous membranes moist CV: RRR Pulm: breathing comfortably   Left Upper Extremity / Hand Large soft tissue defect of left upper extremity with exposed muscle.  Swelling about the ulnar wrist.  Unable to move her fingers.  Sensation intact in median, radial, and ulnar distributions.  Palpable radial pulse.   Multiple dog bites to remaining limbs.    Pertinent Labs: n/a  Imaging: I have personally reviewed the following studies: 05/20/22: Plain films of the left wrist demonstrate an ulnar head fracture.  Additional Studies: n/a  Assessment/Plan: Multiple dog bites with left arm soft tissue defect  Plan to go to OR tonight for wound exploration, I&D and placement of wound vac.  Discussed risks and benefits of procedure with patient and she would like to proceed.    Shaune Pollack, MD Hand and Upper Extremity Surgery [facility] [phone] 05/20/2022 11:50 PM

## 2022-05-20 NOTE — ED Notes (Signed)
Trauma Response Nurse Documentation   Anaise Floresca is a 67 y.o. female arriving to [facility] ED via [facility]  On No antithrombotic. Trauma was activated as a Level 2 by Eston Esters based on the following trauma criteria Grossly contaminated open fractures.     GCS 15.  History   No past medical history on file.   Past Surgical History:  Procedure Laterality Date   AUGMENTATION MAMMAPLASTY     hip replacements Bilateral        Initial Focused Assessment (If applicable, or please see trauma documentation): Airway-- intact, no visible obstruction Breathing-- spontaneous, unlabored Circulation-- degloving injury to the left elbow. Bleeding initially controlled via tourniquet by EMS.  CT's Completed:   CT left elbow  Interventions:  See event summary  Plan for disposition:  OR   Consults completed:  Hand surgery at 2130.  Event Summary: Patient brought in [facility]. Patient was attacked by a pit bull this evening. Patient arrives with a degloving injury to the left elbow, bleeding controlled via tourniquet by EMS, tourniquet taken down by EDP, no arterial bleeding noted. Patient also has laceration to down to muscle on right forearm and right ankle, all bleeding controlled. Abrasions to boh knees and backs of legs noted. Manual BP was 120/70. 18 G PIV RAC established. 18 G PIV upper arm established. Trauma labs obtained. Patient log-rolled by staff, no injury to back noted. 100 mcg fentanyl, tdap, zosyn, 1L NS administered. Xray chest, pelvis, L humerus, L forearm, R forearm, L knee, R tib/fib completed.  MTP Summary (If applicable):  N/A  Bedside handoff with ED RN Joselyn Glassman.    Leota Sauers  Trauma Response RN  Please call TRN at [phone] for further assistance.

## 2022-05-20 NOTE — Plan of Care (Signed)
Orthopedic Plan of Care  ER called about left elbow wound from dog bite. I was informed Dr. Kerry Fort with hand surgery had already seen the patient and was considering operative irrigation and debridement tonight. The reason the ER was consulting was to help determine whether the elbow was involved. I ordered a CT scan to evaluate for intraarticular gas or fracture not seen on XR to help answer their clinical question. Dr. Kerry Fort then called me to discuss the patient's care. After reviewing the clinical photos in the chart, I think patient needs operative irrigation and debridement tonight which was his plan as well. He is going to take her tonight. He is planning to irrigate and debride the wound including the elbow joint if it is involved. I told him that is all I would do if there was elbow joint involvement in addition to antibiotics. He told me he would let me know tonight or tomorrow if he needs my assistance. I am available if need, otherwise, I will follow peripherally.   April Sheer, MD Orthopedic Surgeon

## 2022-05-20 NOTE — H&P (Signed)
Surgical Evaluation Requesting provider: Dr. Kristine Royal  Chief Complaint: Dog bite  HPI: 67 year old who presented as a level 2 trauma alert this evening after being mauled by an approximately 60 pound pitbull.  She reports that she was letting her 11 pound dog out in the yard when this dog which belongs to a neighbor came into her yard to attempt to attack her smaller dog.  She was able to get her dog back inside but then the pitbull attacked her.  She did fall to her knees and was dragged somewhat around the yard before bystanders were able to get the dog away from her.  She has significant pain currently.  Noted to have essentially degloving injury to her left upper extremity as well as multiple puncture wounds on other extremities.    No Known Allergies  No past medical history on file.  Past Surgical History:  Procedure Laterality Date   AUGMENTATION MAMMAPLASTY     hip replacements Bilateral     Family History  Problem Relation Age of Onset   Breast cancer Neg Hx     Social History   Socioeconomic History   Marital status: Widowed    Spouse name: Not on file   Number of children: Not on file   Years of education: Not on file   Highest education level: Not on file  Occupational History   Not on file  Tobacco Use   Smoking status: Former    Packs/day: 0.50    Years: 35.00    Additional pack years: 0.00    Total pack years: 17.50    Types: Cigarettes    Quit date: 04/03/2010    Years since quitting: 12.1   Smokeless tobacco: Never  Substance and Sexual Activity   Alcohol use: Not on file   Drug use: Not on file   Sexual activity: Not on file  Other Topics Concern   Not on file  Social History Narrative   Not on file   Social Determinants of Health   Financial Resource Strain: Not on file  Food Insecurity: Not on file  Transportation Needs: Not on file  Physical Activity: Not on file  Stress: Not on file  Social Connections: Not on file    No current  facility-administered medications on file prior to encounter.   Current Outpatient Medications on File Prior to Encounter  Medication Sig Dispense Refill   amoxicillin-clavulanate (AUGMENTIN) 875-125 MG tablet Take 1 tablet by mouth every 12 (twelve) hours. 14 tablet 0   COVID-19 mRNA bivalent vaccine, Pfizer, (PFIZER COVID-19 VAC BIVALENT) injection Inject into the muscle. 0.3 mL 0   [employer] D3 1000 units capsule Take 1,000 Units by mouth daily.  2   estradiol (ESTRACE VAGINAL) 0.1 MG/GM vaginal cream INSERT 1 GRAM VAGINALLY 3 TIMES PER WEEK     ibuprofen (ADVIL,MOTRIN) 800 MG tablet Take 800 mg by mouth 3 (three) times daily as needed.  2   MYRBETRIQ 50 MG TB24 tablet Take 50 mg by mouth daily.  11   nortriptyline (PAMELOR) 75 MG capsule Take 75 mg by mouth at bedtime.  3    Review of Systems: a complete, 10pt review of systems was completed with pertinent positives and negatives as documented in the HPI  Physical Exam: Vitals:   05/20/22 2120 05/20/22 2130  BP:  (!) 160/98  Pulse: 81 80  Resp: 15 (!) 21  Temp:    SpO2: 92% 99%   Gen: A&Ox3, no distress  Head: No evident  traumatic injury, no facial injury Eyes: lids and conjunctivae normal, no icterus. Pupils equally round and reactive to light.  Neck: supple without mass or thyromegaly, trachea midline, no crepitus or hematoma.  C-spine without tenderness or deformity Chest: respiratory effort is normal. No crepitus or tenderness on palpation of the chest. Breath sounds equal.  Cardiovascular: RRR with palpable distal pulses including both radial pulses, no pedal edema Gastrointestinal: soft, nondistended, nontender. No mass, hepatomegaly or splenomegaly.  No wounds or other injury. Muscoloskeletal: no clubbing or cyanosis of the fingers.  Able to flex the fingers on her left hand and sensation intact to light touch here.  Able to move all other extremities without restriction. Neuro: cranial nerves grossly intact.  Sensation intact to  light touch diffusely. Psych: appropriate mood and affect, normal insight/judgment intact  Skin: warm and dry.  Complex degloving injury to the left upper extremity, avulsion laceration to the right forearm, avulsion laceration to the left lateral knee and right medial ankle, superficial abrasions/contusions to bilateral anterior knees.  See photos in media section.      Latest Ref Rng & Units 05/20/2022    9:10 PM 05/20/2022    9:01 PM 09/03/2021    7:54 AM  CBC  WBC 4.0 - 10.5 K/uL  14.8  10.6   Hemoglobin 12.0 - 15.0 g/dL 47.8  29.5  62.1   Hematocrit 36.0 - 46.0 % 42.0  41.3  43.1   Platelets 150 - 400 K/uL  352  309        Latest Ref Rng & Units 05/20/2022    9:10 PM 05/20/2022    9:01 PM 09/03/2021    7:54 AM  CMP  Glucose 70 - 99 mg/dL 308  657  846   BUN 8 - 23 mg/dL 17  16  9    Creatinine 0.44 - 1.00 mg/dL 9.62  9.52  8.41   Sodium 135 - 145 mmol/L 135  135  140   Potassium 3.5 - 5.1 mmol/L 4.1  4.1  3.8   Chloride 98 - 111 mmol/L 103  100  104   CO2 22 - 32 mmol/L  18  29   Calcium 8.9 - 10.3 mg/dL  9.0  8.8   Total Protein 6.5 - 8.1 g/dL  6.6  7.0   Total Bilirubin 0.3 - 1.2 mg/dL  0.4  1.3   Alkaline Phos 38 - 126 U/L  87  74   AST 15 - 41 U/L  29  20   ALT 0 - 44 U/L  19  16     Lab Results  Component Value Date   INR 0.9 05/20/2022    Imaging: DG Humerus Left  Result Date: 05/20/2022 CLINICAL DATA:  Trauma, dog bite. EXAM: LEFT HUMERUS - 2+ VIEW COMPARISON:  None Available. FINDINGS: There is no evidence of fracture or other focal bone lesions. Large soft tissue defect about the distal lateral aspect of the upper arm towards the elbow. No radiopaque foreign body. Tourniquet overlies the mid arm. IMPRESSION: 1. Large soft tissue defect about the distal lateral upper arm. No radiopaque foreign body or acute fracture. 2. Tourniquet overlies the mid arm. Electronically Signed   By: Narda Rutherford M.D.   On: 05/20/2022 22:16   DG Forearm Left  Result Date:  05/20/2022 CLINICAL DATA:  Trauma, dog bite. EXAM: LEFT FOREARM - 2 VIEW COMPARISON:  None Available. FINDINGS: Displaced fracture of the distal ulna. 1/2 shaft with radial displacement of distal fracture fragment.  Deformity of the distal radius has the appearance of a remote injury. Elbow alignment is maintained. There is diffuse subcutaneous edema and multifocal soft tissue irregularity. There is a punctate round density within the ulnar soft tissues at is likely a soft tissue phleboliths, although small foreign body is not entirely excluded. IMPRESSION: 1. Displaced distal ulna fracture. Findings suggestive of remote distal radius fracture. 2. Punctate round density in the ulnar soft tissues is likely a soft tissue phlebolith, although small foreign body is not entirely excluded. 3. Diffuse subcutaneous edema and multifocal soft tissue irregularity. Electronically Signed   By: Narda Rutherford M.D.   On: 05/20/2022 22:15   DG Forearm Right  Result Date: 05/20/2022 CLINICAL DATA:  Trauma, dog bite. EXAM: RIGHT FOREARM - 2 VIEW COMPARISON:  None Available. FINDINGS: There is no evidence of fracture or other focal bone lesions. Wrist and elbow alignment are maintained. There is soft tissue edema at multiple sites. No radiopaque foreign body. Antecubital IVs. IMPRESSION: Soft tissue edema without radiopaque foreign body or acute fracture. Electronically Signed   By: Narda Rutherford M.D.   On: 05/20/2022 22:13   DG Knee 2 Views Left  Result Date: 05/20/2022 CLINICAL DATA:  Trauma, dog bite. EXAM: LEFT KNEE - 1-2 VIEW COMPARISON:  None Available. FINDINGS: No fracture or dislocation. Moderate degenerative change with tricompartmental peripheral spurring and medial tibiofemoral joint space narrowing. Suspect sequela of remote injury in the proximal tibia. Tibial ghost track in the metaphysis. No significant joint effusion. Subcutaneous edema medial and laterally. No soft tissue foreign body. IMPRESSION: 1. No  acute fracture or dislocation. 2. Moderate knee degenerative change and probable sequela of remote injury in the proximal tibia. 3. Subcutaneous edema. Electronically Signed   By: Narda Rutherford M.D.   On: 05/20/2022 22:12   DG Tibia/Fibula Right  Result Date: 05/20/2022 CLINICAL DATA:  Trauma, dog bite. EXAM: RIGHT TIBIA AND FIBULA - 2 VIEW COMPARISON:  None Available. FINDINGS: There is no evidence of fracture or other focal bone lesions. The cortical margins of the tibia and fibular intact. Soft tissue defect about the medial aspect of the lower leg. There is diffuse subcutaneous edema. No radiopaque foreign body. IMPRESSION: 1. Soft tissue defect about the medial lower leg. No radiopaque foreign body or acute osseous abnormality. 2. Diffuse subcutaneous edema. 3. No acute osseous abnormalities. Electronically Signed   By: Narda Rutherford M.D.   On: 05/20/2022 22:10   DG Pelvis Portable  Result Date: 05/20/2022 CLINICAL DATA:  Trauma, dog bite. EXAM: PORTABLE PELVIS 1-2 VIEWS COMPARISON:  None Available. FINDINGS: Intact bilateral hip arthroplasties. No periprosthetic lucency or fracture. Intact bony pelvis, no rami fractures. There is mild degenerative change of the pubic symphysis which remains congruent. No sacroiliac joint diastasis. Subcutaneous edema about both hips and lateral upper thighs. IMPRESSION: 1. Intact bilateral hip arthroplasties. No acute fracture. 2. Subcutaneous edema about the hips and lateral upper thighs. Electronically Signed   By: Narda Rutherford M.D.   On: 05/20/2022 22:09   DG Chest Port 1 View  Result Date: 05/20/2022 CLINICAL DATA:  Trauma, dog bite. EXAM: PORTABLE CHEST 1 VIEW COMPARISON:  None Available. FINDINGS: The heart is normal in size. There is aortic tortuosity. No pneumothorax or pleural effusion. No focal airspace disease. On limited assessment, no acute osseous findings. IMPRESSION: 1. No acute traumatic injury to the thorax. 2. Tortuous aorta.  Electronically Signed   By: Narda Rutherford M.D.   On: 05/20/2022 22:09     A/P: 67 year old  woman who sustained multiple soft tissue injuries as well as a left ulnar fracture from a dog attack.  Plain films of the chest, pelvis, left humerus, wrist and forearm, right forearm, left knee, right tib-fib reveal a displaced left ulnar fracture. Left upper extremity degloving injury. Multiple additional puncture wounds on multiple extremities.  Dr. Rodena Medin has discussed the case with Dr. Kerry Fort (hand) and Dr. Christell Constant (ortho) who will evaluate the patient and decide on a plan for washout of the left upper extremity in the operating room this evening.    She has multiple additional smaller wounds on the other 3 extremities, which I recommend local wound care and prophylactic antibiotics at this point.  Law enforcement is already involved, and verification of the dog's vaccination status is pending.   Patient Active Problem List   Diagnosis Date Noted   Trauma 05/20/2022   Insomnia 06/02/2017   OSA (obstructive sleep apnea) 06/02/2017       Phylliss Blakes, MD [facility]  See AMION to contact appropriate on-call provider   MDM- high

## 2022-05-21 ENCOUNTER — Encounter (HOSPITAL_COMMUNITY): Payer: Self-pay

## 2022-05-21 ENCOUNTER — Other Ambulatory Visit: Payer: Self-pay

## 2022-05-21 ENCOUNTER — Observation Stay (HOSPITAL_COMMUNITY): Payer: Medicare HMO | Admitting: Anesthesiology

## 2022-05-21 ENCOUNTER — Observation Stay (HOSPITAL_BASED_OUTPATIENT_CLINIC_OR_DEPARTMENT_OTHER): Payer: Medicare HMO | Admitting: Anesthesiology

## 2022-05-21 DIAGNOSIS — S52602A Unspecified fracture of lower end of left ulna, initial encounter for closed fracture: Secondary | ICD-10-CM

## 2022-05-21 DIAGNOSIS — Z9989 Dependence on other enabling machines and devices: Secondary | ICD-10-CM

## 2022-05-21 DIAGNOSIS — S21159A Open bite of unspecified front wall of thorax without penetration into thoracic cavity, initial encounter: Secondary | ICD-10-CM | POA: Diagnosis not present

## 2022-05-21 DIAGNOSIS — S51851A Open bite of right forearm, initial encounter: Secondary | ICD-10-CM | POA: Diagnosis not present

## 2022-05-21 DIAGNOSIS — D62 Acute posthemorrhagic anemia: Secondary | ICD-10-CM | POA: Diagnosis not present

## 2022-05-21 DIAGNOSIS — S52601A Unspecified fracture of lower end of right ulna, initial encounter for closed fracture: Secondary | ICD-10-CM | POA: Diagnosis not present

## 2022-05-21 DIAGNOSIS — Z96643 Presence of artificial hip joint, bilateral: Secondary | ICD-10-CM | POA: Diagnosis not present

## 2022-05-21 DIAGNOSIS — S41151A Open bite of right upper arm, initial encounter: Secondary | ICD-10-CM | POA: Diagnosis not present

## 2022-05-21 DIAGNOSIS — Z87891 Personal history of nicotine dependence: Secondary | ICD-10-CM | POA: Diagnosis not present

## 2022-05-21 DIAGNOSIS — T8189XA Other complications of procedures, not elsewhere classified, initial encounter: Secondary | ICD-10-CM | POA: Diagnosis not present

## 2022-05-21 DIAGNOSIS — S81811A Laceration without foreign body, right lower leg, initial encounter: Secondary | ICD-10-CM | POA: Diagnosis not present

## 2022-05-21 DIAGNOSIS — D649 Anemia, unspecified: Secondary | ICD-10-CM | POA: Diagnosis not present

## 2022-05-21 DIAGNOSIS — S41152A Open bite of left upper arm, initial encounter: Secondary | ICD-10-CM | POA: Diagnosis not present

## 2022-05-21 DIAGNOSIS — T1490XA Injury, unspecified, initial encounter: Secondary | ICD-10-CM | POA: Diagnosis not present

## 2022-05-21 DIAGNOSIS — Z23 Encounter for immunization: Secondary | ICD-10-CM | POA: Diagnosis not present

## 2022-05-21 DIAGNOSIS — Z79899 Other long term (current) drug therapy: Secondary | ICD-10-CM | POA: Diagnosis not present

## 2022-05-21 DIAGNOSIS — S51852A Open bite of left forearm, initial encounter: Secondary | ICD-10-CM | POA: Diagnosis not present

## 2022-05-21 DIAGNOSIS — W540XXA Bitten by dog, initial encounter: Secondary | ICD-10-CM | POA: Diagnosis not present

## 2022-05-21 DIAGNOSIS — G4733 Obstructive sleep apnea (adult) (pediatric): Secondary | ICD-10-CM

## 2022-05-21 DIAGNOSIS — F430 Acute stress reaction: Secondary | ICD-10-CM | POA: Diagnosis not present

## 2022-05-21 DIAGNOSIS — S51802A Unspecified open wound of left forearm, initial encounter: Secondary | ICD-10-CM | POA: Diagnosis not present

## 2022-05-21 DIAGNOSIS — S81851A Open bite, right lower leg, initial encounter: Secondary | ICD-10-CM | POA: Diagnosis not present

## 2022-05-21 DIAGNOSIS — S81032A Puncture wound without foreign body, left knee, initial encounter: Secondary | ICD-10-CM | POA: Diagnosis not present

## 2022-05-21 DIAGNOSIS — S51831A Puncture wound without foreign body of right forearm, initial encounter: Secondary | ICD-10-CM | POA: Diagnosis not present

## 2022-05-21 DIAGNOSIS — S51811A Laceration without foreign body of right forearm, initial encounter: Secondary | ICD-10-CM | POA: Diagnosis not present

## 2022-05-21 DIAGNOSIS — S59812A Other specified injuries left forearm, initial encounter: Secondary | ICD-10-CM | POA: Diagnosis not present

## 2022-05-21 DIAGNOSIS — S31050A Open bite of lower back and pelvis without penetration into retroperitoneum, initial encounter: Secondary | ICD-10-CM | POA: Diagnosis not present

## 2022-05-21 DIAGNOSIS — S81852A Open bite, left lower leg, initial encounter: Secondary | ICD-10-CM | POA: Diagnosis not present

## 2022-05-21 DIAGNOSIS — S41002A Unspecified open wound of left shoulder, initial encounter: Secondary | ICD-10-CM | POA: Diagnosis not present

## 2022-05-21 DIAGNOSIS — S52252A Displaced comminuted fracture of shaft of ulna, left arm, initial encounter for closed fracture: Secondary | ICD-10-CM | POA: Diagnosis not present

## 2022-05-21 DIAGNOSIS — Z79890 Hormone replacement therapy (postmenopausal): Secondary | ICD-10-CM | POA: Diagnosis not present

## 2022-05-21 LAB — CBC
HCT: 36.1 % (ref 36.0–46.0)
Hemoglobin: 11.8 g/dL — ABNORMAL LOW (ref 12.0–15.0)
MCH: 30.4 pg (ref 26.0–34.0)
MCHC: 32.7 g/dL (ref 30.0–36.0)
MCV: 93 fL (ref 80.0–100.0)
Platelets: 285 10*3/uL (ref 150–400)
RBC: 3.88 MIL/uL (ref 3.87–5.11)
RDW: 13.4 % (ref 11.5–15.5)
WBC: 12.5 10*3/uL — ABNORMAL HIGH (ref 4.0–10.5)
nRBC: 0 % (ref 0.0–0.2)

## 2022-05-21 LAB — BASIC METABOLIC PANEL
Anion gap: 11 (ref 5–15)
BUN: 12 mg/dL (ref 8–23)
CO2: 20 mmol/L — ABNORMAL LOW (ref 22–32)
Calcium: 8.2 mg/dL — ABNORMAL LOW (ref 8.9–10.3)
Chloride: 100 mmol/L (ref 98–111)
Creatinine, Ser: 0.77 mg/dL (ref 0.44–1.00)
GFR, Estimated: >60 mL/min (ref >60)
Glucose, Bld: 151 mg/dL — ABNORMAL HIGH (ref 70–99)
Potassium: 4.7 mmol/L (ref 3.5–5.1)
Sodium: 131 mmol/L — ABNORMAL LOW (ref 135–145)

## 2022-05-21 LAB — HIV ANTIBODY (ROUTINE TESTING W REFLEX): HIV Screen 4th Generation wRfx: NONREACTIVE

## 2022-05-21 MED ORDER — LIDOCAINE HCL (CARDIAC) PF 50 MG/5ML IV SOSY
PREFILLED_SYRINGE | INTRAVENOUS | Status: DC | PRN
  Administered 2022-05-21: 60 mg via INTRAVENOUS

## 2022-05-21 MED ORDER — DEXAMETHASONE SODIUM PHOSPHATE 10 MG/ML IJ SOLN
INTRAMUSCULAR | Status: AC
  Filled 2022-05-21: qty 1

## 2022-05-21 MED ORDER — ACETAMINOPHEN 10 MG/ML IV SOLN
INTRAVENOUS | Status: DC | PRN
  Administered 2022-05-21: 1000 mg via INTRAVENOUS

## 2022-05-21 MED ORDER — ONDANSETRON HCL 4 MG/2ML IJ SOLN
INTRAMUSCULAR | Status: DC | PRN
  Administered 2022-05-21: 4 mg via INTRAVENOUS

## 2022-05-21 MED ORDER — LIDOCAINE 2% (20 MG/ML) 5 ML SYRINGE
INTRAMUSCULAR | Status: AC
  Filled 2022-05-21: qty 5

## 2022-05-21 MED ORDER — SUGAMMADEX SODIUM 200 MG/2ML IV SOLN
INTRAVENOUS | Status: DC | PRN
  Administered 2022-05-21: 120 mg via INTRAVENOUS

## 2022-05-21 MED ORDER — PHENYLEPHRINE HCL-NACL 20-0.9 MG/250ML-% IV SOLN
INTRAVENOUS | Status: DC | PRN
  Administered 2022-05-21: 50 ug/min via INTRAVENOUS

## 2022-05-21 MED ORDER — OXYCODONE HCL 5 MG PO TABS: 5.0000 mg | ORAL_TABLET | Freq: Once | ORAL | Status: DC | PRN

## 2022-05-21 MED ORDER — KETOROLAC TROMETHAMINE 30 MG/ML IJ SOLN
INTRAMUSCULAR | Status: DC | PRN
  Administered 2022-05-21: 30 mg via INTRAVENOUS

## 2022-05-21 MED ORDER — OXYCODONE HCL 5 MG/5ML PO SOLN: 5.0000 mg | Freq: Once | ORAL | Status: DC | PRN

## 2022-05-21 MED ORDER — ROCURONIUM BROMIDE 10 MG/ML (PF) SYRINGE
PREFILLED_SYRINGE | INTRAVENOUS | Status: AC
  Filled 2022-05-21: qty 10

## 2022-05-21 MED ORDER — DEXAMETHASONE SODIUM PHOSPHATE 4 MG/ML IJ SOLN
INTRAMUSCULAR | Status: DC | PRN
  Administered 2022-05-21: 5 mg via INTRAVENOUS

## 2022-05-21 MED ORDER — PROPOFOL 10 MG/ML IV BOLUS
INTRAVENOUS | Status: DC | PRN
  Administered 2022-05-21: 140 mg via INTRAVENOUS
  Administered 2022-05-21: 30 mg via INTRAVENOUS

## 2022-05-21 MED ORDER — FENTANYL CITRATE (PF) 100 MCG/2ML IJ SOLN
INTRAMUSCULAR | Status: DC | PRN
  Administered 2022-05-21: 50 ug via INTRAVENOUS
  Administered 2022-05-21: 100 ug via INTRAVENOUS
  Administered 2022-05-21: 50 ug via INTRAVENOUS
  Administered 2022-05-21 (×2): 25 ug via INTRAVENOUS

## 2022-05-21 MED ORDER — KETOROLAC TROMETHAMINE 30 MG/ML IJ SOLN
INTRAMUSCULAR | Status: AC
  Filled 2022-05-21: qty 1

## 2022-05-21 MED ORDER — LACTATED RINGERS IV SOLN: INTRAVENOUS | Status: DC | PRN

## 2022-05-21 MED ORDER — SUCCINYLCHOLINE CHLORIDE 20 MG/ML IJ SOLN
INTRAMUSCULAR | Status: DC | PRN
  Administered 2022-05-21: 100 mg via INTRAVENOUS

## 2022-05-21 MED ORDER — FENTANYL CITRATE (PF) 100 MCG/2ML IJ SOLN
25.0000 ug | INTRAMUSCULAR | Status: DC | PRN
  Administered 2022-05-21: 50 ug via INTRAVENOUS

## 2022-05-21 MED ORDER — MIDAZOLAM HCL 5 MG/5ML IJ SOLN
INTRAMUSCULAR | Status: DC | PRN
  Administered 2022-05-21: 2 mg via INTRAVENOUS

## 2022-05-21 MED ORDER — ROCURONIUM BROMIDE 100 MG/10ML IV SOLN
INTRAVENOUS | Status: DC | PRN
  Administered 2022-05-21: 40 mg via INTRAVENOUS

## 2022-05-21 MED ORDER — HYDROXYZINE HCL 25 MG PO TABS
25.0000 mg | ORAL_TABLET | Freq: Three times a day (TID) | ORAL | Status: DC | PRN
  Administered 2022-05-21 – 2022-05-28 (×7): 25 mg via ORAL
  Filled 2022-05-21 (×7): qty 1

## 2022-05-21 MED ORDER — METHOCARBAMOL 500 MG PO TABS
500.0000 mg | ORAL_TABLET | Freq: Three times a day (TID) | ORAL | Status: DC
  Administered 2022-05-21 (×3): 500 mg via ORAL
  Filled 2022-05-21 (×3): qty 1

## 2022-05-21 MED ORDER — QUETIAPINE FUMARATE 25 MG PO TABS
50.0000 mg | ORAL_TABLET | Freq: Two times a day (BID) | ORAL | Status: DC | PRN
  Administered 2022-05-21 – 2022-05-27 (×4): 50 mg via ORAL
  Filled 2022-05-21 (×4): qty 2

## 2022-05-21 MED ORDER — AMISULPRIDE (ANTIEMETIC) 5 MG/2ML IV SOLN: 10.0000 mg | Freq: Once | INTRAVENOUS | Status: DC | PRN

## 2022-05-21 MED ORDER — ONDANSETRON HCL 4 MG/2ML IJ SOLN
INTRAMUSCULAR | Status: AC
  Filled 2022-05-21: qty 2

## 2022-05-21 MED ORDER — FENTANYL CITRATE (PF) 100 MCG/2ML IJ SOLN
INTRAMUSCULAR | Status: AC
  Filled 2022-05-21: qty 2

## 2022-05-21 MED ORDER — SUCCINYLCHOLINE CHLORIDE 200 MG/10ML IV SOSY
PREFILLED_SYRINGE | INTRAVENOUS | Status: AC
  Filled 2022-05-21: qty 10

## 2022-05-21 MED ORDER — SODIUM CHLORIDE 0.9 % IR SOLN
Status: DC | PRN
  Administered 2022-05-21: 6000 mL
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
## 2022-05-21 NOTE — Op Note (Signed)
NAME: [facility] MEDICAL RECORD NO: [id number] DATE OF BIRTH: [birth year] FACILITY: [facility] LOCATION: [facility] OR PHYSICIAN: Ramon Dredge MD   OPERATIVE REPORT   DATE OF PROCEDURE: 05/21/22    PREOPERATIVE DIAGNOSIS:  Left arm soft tissue defects and concern for left distal ulna open fracture as well as right forearm open wounds due to pit bull attack   POSTOPERATIVE DIAGNOSIS:  same   PROCEDURE:  Irrigation and debridement of soft tissue defects of left arm and distal ulna and irrigation debridement of right forearm wounds   SURGEON: Edsel Petrin. Jimi Giza, M.D.   ASSISTANT: none   ANESTHESIA:  General   INTRAVENOUS FLUIDS:  Per anesthesia flow sheet.   ESTIMATED BLOOD LOSS:  <20cc   COMPLICATIONS:  None.   SPECIMENS:  none   TOURNIQUET TIME:   N/a   DISPOSITION:  Stable to PACU.   INDICATIONS:  67 year old female who was attacked by a pit bull earlier this evening.  She sustained multiple bites and had a large soft tissue defect around the left elbow that wrapped posterior lateral up the upper arm.  OPERATIVE COURSE: Patient was identified in holding.  The site, side, and surgery were confirmed.  The risks of the procedure were reviewed.  Patient was brought back to the operating room and was intubated by general anesthesia.  She was then transferred to the operating room bed with her left upper extremity outstretched on a hand table.  She was then prepped with Betadine and draped in typical sterile fashion.  The large soft tissue defect was explored.  There was debris present in the wound.  The radial nerve was stripped of all surrounding soft tissue in the middle of the wound bed.  The soft tissue defect went down to the anterior aspect of the distal humerus.  The elbow joint was not violated.  The debris and any necrotic appearing tissue was sharply excised with tenotomy's.  Soft tissue defect also included muscle belly from the common extensor origin and the  mobile.  The wound was then irrigated thoroughly with 6 L of normal saline.  There is a large portion of missing skin that could not be closed anterolaterally.  A small portion of the wound could be closed posteriorly this was closed with 2-0 nylons.  The soft tissue defect measured 15 cm long by 13 cm wide by 2 cm deep.  Next [employer]ec was placed over the wound bed followed by a wound VAC.  Next attention was turned to the remaining dog bites on the left forearm.  Specifically the distalmost wound that was around the area of the distal ulnar fracture.  This wound was incised to create an L-shaped incision to fully be able to explore it.  It was explored and no visible bone present.  Soft tissues were sharply debrided with tenotomy's and then bluntly divided to the bone could be palpated.  Copious amounts of normal saline were used to irrigate this wound as well as the smaller forearm wounds.  A single 2-0 nylon was used to approximate the skin due to the L-shaped incision of the distalmost wound.  The forearm wounds were then covered and [employer]ec, gauze, loosely wrapped with Kerlix and then Webril and the patient was placed in a sugar-tong splint to stabilize the distal ulna fracture.  Decision was made not to fix the ulna at this time due to the extensive injuries and possible need for repeat washout as well as concern for need for soft  tissue coverage over the primary soft tissue defect.  Next attention was turned to the right forearm open wounds.   The wounds were painted and Betadine and irrigated with approximately 2 and half liters of normal saline.  The 2 largest wounds both measuring approximately 3 cm had a single stitch placed to approximate the skin but allow for drainage.  Smaller wounds were left open to drain.  Adaptic was placed over the wounds on the right forearm and then gauze and Kerlix were utilized.  All counts were correct.  The patient was awakened and brought to the recovery room without  issue.   POST OP PLAN: Patient will need soft tissue coverage, likely a flap either rotational or otherwise.  Plan to remain with wound vac and splint on left upper extremity, non-weight bearing, as plan can be determined for definitive coverage and distal ulna fracture fixation.   Ramon Dredge, MD Electronically signed, 05/21/22

## 2022-05-21 NOTE — Anesthesia Procedure Notes (Signed)
Procedure Name: Intubation Date/Time: 05/21/2022 12:42 AM  Performed by: Edmonia Caprio, CRNAPre-anesthesia Checklist: Patient identified, Emergency Drugs available, Suction available, Patient being monitored and Timeout performed Patient Re-evaluated:Patient Re-evaluated prior to induction Oxygen Delivery Method: Circle system utilized Preoxygenation: Pre-oxygenation with 100% oxygen Induction Type: Rapid sequence and IV induction Laryngoscope Size: Miller and 2 Grade View: Grade I Tube type: Oral Tube size: 7.0 mm Number of attempts: 1 Airway Equipment and Method: Stylet Placement Confirmation: ETT inserted through vocal cords under direct vision, positive ETCO2 and breath sounds checked- equal and bilateral Secured at: 23 cm Tube secured with: Tape Dental Injury: Teeth and Oropharynx as per pre-operative assessment

## 2022-05-21 NOTE — Progress Notes (Signed)
Physical Therapy Evaluation Patient Details Name: April Bowman MRN: [id number] DOB: 02-Apr-1955 Today's Date: 05/21/2022  History of Present Illness  Pt is a 67 y/o female presenting after being mauled by a dog resulting in multiple bites with most significant being a degloving injury around L elbow. Underwent I&D of L UE wounds on 5/8 with wound vac placement. PMH: B hip replacements, insomnia  Clinical Impression  Pt was seen for mobility on side of bed then to stand, requiring min assist to move and sidestep to [facility], then to [facility].  Pt is initiating moving from bed, avoids WB on LUE well and can support the effort to side of bed with care to protect RUE.  Bandage is loose, and contacted nursing to identify the issue.  Pt is back to bed by preference, but will encourage OOB to chair for support of endurance and balance to move.  Has recommendation for <3 hours a day rehab, but will work toward home discharge.  Pt has no daytime help, and will need assistance to get OOB and move currently.  Follow for goals of acute PT.       Recommendations for follow up therapy are one component of a multi-disciplinary discharge planning process, led by the attending physician.  Recommendations may be updated based on patient status, additional functional criteria and insurance authorization.  Follow Up Recommendations Can patient physically be transported by private vehicle: No     Assistance Recommended at Discharge Frequent or constant Supervision/Assistance  Patient can return home with the following  A little help with walking and/or transfers;A little help with bathing/dressing/bathroom;Assistance with cooking/housework;Help with stairs or ramp for entrance;Assist for transportation    Equipment Recommendations None recommended by PT  Recommendations for Other Services       Functional Status Assessment Patient has had a recent decline in their functional status and demonstrates the ability to make  significant improvements in function in a reasonable and predictable amount of time.     Precautions / Restrictions Precautions Precautions: Fall;Other (comment) Precaution Comments: L UE wound vac (watch BP) Required Braces or Orthoses: Sling Restrictions Weight Bearing Restrictions: Yes LUE Weight Bearing: Non weight bearing      Mobility  Bed Mobility Overal bed mobility: Needs Assistance Bed Mobility: Supine to Sit, Sit to Supine     Supine to sit: Min assist Sit to supine: Min assist   General bed mobility comments: Pt assisted to sit on side of bed with help only to support trunk.  Returned to bed with trunk support    Transfers Overall transfer level: Needs assistance Equipment used: 1 person hand held assist Transfers: Sit to/from Stand Sit to Stand: Min assist           General transfer comment: supported her to stand with maintenance of NWB on LUE    Ambulation/Gait Ambulation/Gait assistance: Min assist Gait Distance (Feet): 3 Feet Assistive device: 1 person hand held assist Gait Pattern/deviations: Step-to pattern, Decreased stride length Gait velocity: reduced Gait velocity interpretation: <1.31 ft/sec, indicative of household ambulator Pre-gait activities: standing balance ck General Gait Details: sidesteps on side of bed with help to get to [facility] and then to return to bed  Stairs            Wheelchair Mobility    Modified Rankin (Stroke Patients Only)       Balance Overall balance assessment: Needs assistance Sitting-balance support: Feet supported Sitting balance-Leahy Scale: Good     Standing balance support: Single extremity supported  Standing balance-Leahy Scale: Fair Standing balance comment: fair with care to avoid injuries                             Pertinent Vitals/Pain Pain Assessment Pain Assessment: Faces Faces Pain Scale: Hurts a little bit Pain Location: LUE to sit up and return to bed Pain  Descriptors / Indicators: Guarding Pain Intervention(s): Limited activity within patient's tolerance, Monitored during session, Premedicated before session, Repositioned    Home Living Family/patient expects to be discharged to:: Unsure Living Arrangements: Children;Other relatives Available Help at Discharge: Family;Available PRN/intermittently Type of Home: House Home Access: Level entry       Home Layout: One level Home Equipment: Agricultural consultant (2 wheels);BSC/3in1 Additional Comments: daughter works during the day. pt reports no other assist available during the day    Prior Function Prior Level of Function : Independent/Modified Independent             Mobility Comments: Pt had previously used walker but more recently no AD       Hand Dominance   Dominant Hand: Right    Extremity/Trunk Assessment   Upper Extremity Assessment Upper Extremity Assessment: Defer to OT evaluation    Lower Extremity Assessment Lower Extremity Assessment: Overall WFL for tasks assessed    Cervical / Trunk Assessment Cervical / Trunk Assessment: Normal  Communication   Communication: No difficulties  Cognition Arousal/Alertness: Awake/alert Behavior During Therapy: WFL for tasks assessed/performed, Anxious Overall Cognitive Status: Within Functional Limits for tasks assessed                                 General Comments: pt assisted to get to side of bed, helpful to move        General Comments General comments (skin integrity, edema, etc.): Pt is up to stand with care to avoid stressing RUE and NWB on LUE, HHA support for now.  Pt is not forbidden to use RUE but the bandage on the wound is not staying in place, nursing was flagged to re-dress it    Exercises     Assessment/Plan    PT Assessment Patient needs continued PT services  PT Problem List Decreased activity tolerance;Decreased balance;Decreased coordination;Decreased skin integrity;Pain        PT Treatment Interventions DME instruction;Gait training;Functional mobility training;Therapeutic activities;Therapeutic exercise;Balance training;Neuromuscular re-education;Patient/family education    PT Goals (Current goals can be found in the Care Plan section)  Acute Rehab PT Goals Patient Stated Goal: to get moving again PT Goal Formulation: With patient Time For Goal Achievement: 06/04/22 Potential to Achieve Goals: Good    Frequency Min 3X/week     Co-evaluation               AM-PAC PT "6 Clicks" Mobility  Outcome Measure Help needed turning from your back to your side while in a flat bed without using bedrails?: A Little Help needed moving from lying on your back to sitting on the side of a flat bed without using bedrails?: A Little Help needed moving to and from a bed to a chair (including a wheelchair)?: A Little Help needed standing up from a chair using your arms (e.g., wheelchair or bedside chair)?: A Little Help needed to walk in hospital room?: A Little Help needed climbing 3-5 steps with a railing? : A Lot 6 Click Score: 17    End of Session  Equipment Utilized During Treatment: Gait belt Activity Tolerance: Patient limited by fatigue;Patient tolerated treatment well Patient left: in bed;with call bell/phone within reach;with bed alarm set;with family/visitor present;with nursing/sitter in room Nurse Communication: Mobility status PT Visit Diagnosis: Unsteadiness on feet (R26.81);Pain Pain - Right/Left:  (bilateral) Pain - part of body: Arm    Time: 1135-1209 PT Time Calculation (min) (ACUTE ONLY): 34 min   Charges:   PT Evaluation $PT Eval Moderate Complexity: 1 Mod PT Treatments $Therapeutic Activity: 8-22 mins       Ivar Drape 05/21/2022, 12:35 PM  Samul Dada, PT PhD Acute Rehab Dept. Number: [facility] [id number] and [facility] 352 [phone]

## 2022-05-21 NOTE — Progress Notes (Signed)
I called [employer] and confirmed that the dog that bit this patient is up to date on its vaccinations.   Juliet Rude, [facility] 05/21/2022, 2:07 PM Please see Amion for pager number during day hours 7:00am-4:30pm

## 2022-05-21 NOTE — H&P (View-Only) (Signed)
Reason for Consult: LUE soft tissue defect Referring Physician: Michael Jeffery, PA-C  April Bowman is an 67 y.o. female.  HPI: Patient is a pleasant 67-year-old female who presented to the ED as a level 2 trauma 05/30/2022 after sustaining puncture wounds to all 4 extremities as well as a degloving injury to the LUE after being attacked by a 60 lb dog.    She was admitted by Dr. Connor and then taken to the operating room at approximately 2 AM this morning by Dr. Chiaramonti for irrigation and debridement of her upper extremity wounds bilaterally.  The large soft tissue defect around the left wound was partially closed posteriorly and defect measured 15 x 13 cm, 2 cm depth.  Adaptic was placed over the wound bed followed by wound VAC.  After conclusion of surgery, Dr .Chiaramonti noted that patient will require soft tissue coverage including possible flap.  Patient was placed in a sugar-tong splint in addition to having wound VAC placement.  Today, patient is resting in bed accompanied by family at bedside.  She states that her pain is relatively well-controlled.    History reviewed. No pertinent past medical history.  Past Surgical History:  Procedure Laterality Date   AUGMENTATION MAMMAPLASTY     hip replacements Bilateral    INCISION AND DRAINAGE OF WOUND Left 05/20/2022   Procedure: IRRIGATION AND DEBRIDEMENT AND WOUND VAC PLACEMENT LEFT ARM;  Surgeon: Chiaramonti, Alexander M, MD;  Location: [facility] OR;  Service: Orthopedics;  Laterality: Left;   MINOR IRRIGATION AND DEBRIDEMENT OF WOUND Right 05/20/2022   Procedure: IRRIGATION AND DEBRIDEMENT OF WOUND RIGHT ARM;  Surgeon: Chiaramonti, Alexander M, MD;  Location: [facility] OR;  Service: Orthopedics;  Laterality: Right;    Family History  Problem Relation Age of Onset   Breast cancer Neg Hx     Social History:  reports that she quit smoking about 12 years ago. Her smoking use included cigarettes. She has a 17.50 pack-year smoking history. She has  never used smokeless tobacco. No history on file for alcohol use and drug use.  Allergies: No Known Allergies  Medications: I have reviewed the patient's current medications.  Results for orders placed or performed during the hospital encounter of 05/20/22 (from the past 48 hour(s))  Comprehensive metabolic panel     Status: Abnormal   Collection Time: 05/20/22  9:01 PM  Result Value Ref Range   Sodium 135 135 - 145 mmol/L   Potassium 4.1 3.5 - 5.1 mmol/L   Chloride 100 98 - 111 mmol/L   CO2 18 (L) 22 - 32 mmol/L   Glucose, Bld 135 (H) 70 - 99 mg/dL    Comment: Glucose reference range applies only to samples taken after fasting for at least 8 hours.   BUN 16 8 - 23 mg/dL   Creatinine, Ser 0.82 0.44 - 1.00 mg/dL   Calcium 9.0 8.9 - 10.3 mg/dL   Total Protein 6.6 6.5 - 8.1 g/dL   Albumin 3.6 3.5 - 5.0 g/dL   AST 29 15 - 41 U/L   ALT 19 0 - 44 U/L   Alkaline Phosphatase 87 38 - 126 U/L   Total Bilirubin 0.4 0.3 - 1.2 mg/dL   GFR, Estimated >60 >60 mL/min    Comment: (NOTE) Calculated using the CKD-EPI Creatinine Equation (2021)    Anion gap 17 (H) 5 - 15    Comment: Performed at [facility], [address] [place], [place] [address]  CBC     Status:   Abnormal   Collection Time: 05/20/22  9:01 PM  Result Value Ref Range   WBC 14.8 (H) 4.0 - 10.5 K/uL   RBC 4.49 3.87 - 5.11 MIL/uL   Hemoglobin 14.0 12.0 - 15.0 g/dL   HCT 41.3 36.0 - 46.0 %   MCV 92.0 80.0 - 100.0 fL   MCH 31.2 26.0 - 34.0 pg   MCHC 33.9 30.0 - 36.0 g/dL   RDW 13.2 11.5 - 15.5 %   Platelets 352 150 - 400 K/uL   nRBC 0.0 0.0 - 0.2 %    Comment: Performed at [facility], [address] [place], [place] [address]  Ethanol     Status: None   Collection Time: 05/20/22  9:01 PM  Result Value Ref Range   Alcohol, Ethyl (B) <10 <10 mg/dL    Comment: (NOTE) Lowest detectable limit for serum alcohol is 10 mg/dL.  For medical purposes only. Performed at [facility], [address]  [place], [place] [address]   Lactic acid, plasma     Status: Abnormal   Collection Time: 05/20/22  9:01 PM  Result Value Ref Range   Lactic Acid, Venous 6.1 (HH) 0.5 - 1.9 mmol/L    Comment: CRITICAL RESULT CALLED TO, READ BACK BY AND VERIFIED WITH T. WALSTON,RN. 2144 05/20/22. LPAIT Performed at [facility], [address] [place], [place] [address]   Protime-INR     Status: None   Collection Time: 05/20/22  9:01 PM  Result Value Ref Range   Prothrombin Time 12.5 11.4 - 15.2 seconds   INR 0.9 0.8 - 1.2    Comment: (NOTE) INR goal varies based on device and disease states. Performed at [facility], [address] [place], [place] [address]   Type and screen [facility]     Status: None   Collection Time: 05/20/22  9:01 PM  Result Value Ref Range   ABO/RH(D) O POS    Antibody Screen NEG    Sample Expiration      05/23/2022,2359 Performed at [facility], [address] [place], [place] [address]   ABO/Rh     Status: None   Collection Time: 05/20/22  9:06 PM  Result Value Ref Range   ABO/RH(D)      O POS Performed at [facility], [address] [place], [place] [address]   I-Stat Chem 8, ED     Status: Abnormal   Collection Time: 05/20/22  9:10 PM  Result Value Ref Range   Sodium 135 135 - 145 mmol/L   Potassium 4.1 3.5 - 5.1 mmol/L   Chloride 103 98 - 111 mmol/L   BUN 17 8 - 23 mg/dL   Creatinine, Ser 0.70 0.44 - 1.00 mg/dL   Glucose, Bld 132 (H) 70 - 99 mg/dL    Comment: Glucose reference range applies only to samples taken after fasting for at least 8 hours.   Calcium, Ion 0.97 (L) 1.15 - 1.40 mmol/L   TCO2 18 (L) 22 - 32 mmol/L   Hemoglobin 14.3 12.0 - 15.0 g/dL   HCT 42.0 36.0 - 46.0 %  Urinalysis, Routine w reflex microscopic -Urine, Clean Catch     Status: Abnormal   Collection Time: 05/20/22  9:58 PM  Result Value Ref Range   Color, Urine STRAW (A) YELLOW   APPearance CLEAR CLEAR   Specific Gravity, Urine 1.009 1.005  - 1.030   pH 5.0 5.0 -   8.0   Glucose, UA NEGATIVE NEGATIVE mg/dL   Hgb urine dipstick LARGE (A) NEGATIVE   Bilirubin Urine NEGATIVE NEGATIVE   Ketones, ur NEGATIVE NEGATIVE mg/dL   Protein, ur NEGATIVE NEGATIVE mg/dL   Nitrite NEGATIVE NEGATIVE   Leukocytes,Ua NEGATIVE NEGATIVE   RBC / HPF 0-5 0 - 5 RBC/hpf   WBC, UA 0-5 0 - 5 WBC/hpf   Bacteria, UA NONE SEEN NONE SEEN   Squamous Epithelial / HPF 0-5 0 - 5 /HPF   Mucus PRESENT     Comment: Performed at [facility], [address] [place], [place] [address]  CBC     Status: Abnormal   Collection Time: 05/21/22  6:19 AM  Result Value Ref Range   WBC 12.5 (H) 4.0 - 10.5 K/uL   RBC 3.88 3.87 - 5.11 MIL/uL   Hemoglobin 11.8 (L) 12.0 - 15.0 g/dL   HCT 36.1 36.0 - 46.0 %   MCV 93.0 80.0 - 100.0 fL   MCH 30.4 26.0 - 34.0 pg   MCHC 32.7 30.0 - 36.0 g/dL   RDW 13.4 11.5 - 15.5 %   Platelets 285 150 - 400 K/uL   nRBC 0.0 0.0 - 0.2 %    Comment: Performed at [facility], [address] [place], [place] [address]  Basic metabolic panel     Status: Abnormal   Collection Time: 05/21/22  6:19 AM  Result Value Ref Range   Sodium 131 (L) 135 - 145 mmol/L   Potassium 4.7 3.5 - 5.1 mmol/L   Chloride 100 98 - 111 mmol/L   CO2 20 (L) 22 - 32 mmol/L   Glucose, Bld 151 (H) 70 - 99 mg/dL    Comment: Glucose reference range applies only to samples taken after fasting for at least 8 hours.   BUN 12 8 - 23 mg/dL   Creatinine, Ser 0.77 0.44 - 1.00 mg/dL   Calcium 8.2 (L) 8.9 - 10.3 mg/dL   GFR, Estimated >60 >60 mL/min    Comment: (NOTE) Calculated using the CKD-EPI Creatinine Equation (2021)    Anion gap 11 5 - 15    Comment: Performed at [facility], [address] [place], [place] [address]  HIV Antibody (routine testing w rflx)     Status: None   Collection Time: 05/21/22  6:19 AM  Result Value Ref Range   HIV Screen 4th Generation wRfx Non Reactive Non Reactive    Comment: Performed at [facility], [address] [place], [place] [address]    CT ELBOW LEFT WO CONTRAST  Result Date: 05/21/2022 CLINICAL DATA:  Joint effusion. Evaluate elbow for intra-articular gas. Dog bite. EXAM: CT OF THE UPPER LEFT EXTREMITY WITHOUT CONTRAST TECHNIQUE: Multidetector CT imaging of the upper left extremity was performed according to the standard protocol. RADIATION DOSE REDUCTION: This exam was performed according to the departmental dose-optimization program which includes automated exposure control, adjustment of the mA and/or kV according to patient size and/or use of iterative reconstruction technique. COMPARISON:  Radiographs 05/20/2022 FINDINGS: Bones/Joint/Cartilage No acute fracture or dislocation. There is mild loss of definition of the anterior cortex of the distal humeral diaphysis in the area of large soft tissue defect in the distal upper forearm reaching the anterior surface of the humerus (series 8/image 57). Soft tissue edema and gas also extend to the posteromedial humerus epicondyle without definite cortical irregularity. No intra-articular gas or definite elbow joint effusion. Ligaments Suboptimally assessed by CT. Muscles   and Tendons Poorly assessed via CT. There is intramuscular gas within the anterior and posterior compartments of the upper arm and visualized proximal forearm. Soft tissues Extensive soft tissue lacerations about the anterior and posterior arm in the region of the elbow. The lacerations extend to bone at the medial humeral epicondyle and about the anterior distal humeral diaphysis. IMPRESSION: 1. Extensive soft tissue lacerations about the anterior and posterior arm in the region of the elbow. The lacerations extend to bone at the medial humeral epicondyle and about the anterior distal humeral diaphysis. There is mild loss of definition of the anterior cortex of the distal humeral diaphysis in the area of large soft tissue defect without definite fracture. 2. Intramuscular and subcutaneous gas  within the anterior and posterior compartments of the upper arm and visualized proximal forearm. No intra-articular gas or definite elbow joint effusion. Electronically Signed   By: Tyler  Stutzman M.D.   On: 05/21/2022 00:14   DG Wrist Complete Left  Result Date: 05/20/2022 CLINICAL DATA:  Dog bite to the left wrist. EXAM: LEFT WRIST - COMPLETE 3+ VIEW COMPARISON:  Left upper extremity radiograph dated 05/20/2022. FINDINGS: Comminuted and displaced fracture of the distal ulna as seen on the earlier radiograph. No other acute fracture. There is no dislocation. The bones are osteopenic. There is laceration of the skin and superficial soft tissues of the forearm. IMPRESSION: Comminuted and displaced fracture of the distal ulna. Electronically Signed   By: Arash  Radparvar M.D.   On: 05/20/2022 22:31   DG Humerus Left  Result Date: 05/20/2022 CLINICAL DATA:  Trauma, dog bite. EXAM: LEFT HUMERUS - 2+ VIEW COMPARISON:  None Available. FINDINGS: There is no evidence of fracture or other focal bone lesions. Large soft tissue defect about the distal lateral aspect of the upper arm towards the elbow. No radiopaque foreign body. Tourniquet overlies the mid arm. IMPRESSION: 1. Large soft tissue defect about the distal lateral upper arm. No radiopaque foreign body or acute fracture. 2. Tourniquet overlies the mid arm. Electronically Signed   By: Melanie  Sanford M.D.   On: 05/20/2022 22:16   DG Forearm Left  Result Date: 05/20/2022 CLINICAL DATA:  Trauma, dog bite. EXAM: LEFT FOREARM - 2 VIEW COMPARISON:  None Available. FINDINGS: Displaced fracture of the distal ulna. 1/2 shaft with radial displacement of distal fracture fragment. Deformity of the distal radius has the appearance of a remote injury. Elbow alignment is maintained. There is diffuse subcutaneous edema and multifocal soft tissue irregularity. There is a punctate round density within the ulnar soft tissues at is likely a soft tissue phleboliths, although  small foreign body is not entirely excluded. IMPRESSION: 1. Displaced distal ulna fracture. Findings suggestive of remote distal radius fracture. 2. Punctate round density in the ulnar soft tissues is likely a soft tissue phlebolith, although small foreign body is not entirely excluded. 3. Diffuse subcutaneous edema and multifocal soft tissue irregularity. Electronically Signed   By: Melanie  Sanford M.D.   On: 05/20/2022 22:15   DG Forearm Right  Result Date: 05/20/2022 CLINICAL DATA:  Trauma, dog bite. EXAM: RIGHT FOREARM - 2 VIEW COMPARISON:  None Available. FINDINGS: There is no evidence of fracture or other focal bone lesions. Wrist and elbow alignment are maintained. There is soft tissue edema at multiple sites. No radiopaque foreign body. Antecubital IVs. IMPRESSION: Soft tissue edema without radiopaque foreign body or acute fracture. Electronically Signed   By: Melanie  Sanford M.D.   On: 05/20/2022 22:13   DG Knee 2   Views Left  Result Date: 05/20/2022 CLINICAL DATA:  Trauma, dog bite. EXAM: LEFT KNEE - 1-2 VIEW COMPARISON:  None Available. FINDINGS: No fracture or dislocation. Moderate degenerative change with tricompartmental peripheral spurring and medial tibiofemoral joint space narrowing. Suspect sequela of remote injury in the proximal tibia. Tibial ghost track in the metaphysis. No significant joint effusion. Subcutaneous edema medial and laterally. No soft tissue foreign body. IMPRESSION: 1. No acute fracture or dislocation. 2. Moderate knee degenerative change and probable sequela of remote injury in the proximal tibia. 3. Subcutaneous edema. Electronically Signed   By: Melanie  Sanford M.D.   On: 05/20/2022 22:12   DG Tibia/Fibula Right  Result Date: 05/20/2022 CLINICAL DATA:  Trauma, dog bite. EXAM: RIGHT TIBIA AND FIBULA - 2 VIEW COMPARISON:  None Available. FINDINGS: There is no evidence of fracture or other focal bone lesions. The cortical margins of the tibia and fibular intact. Soft  tissue defect about the medial aspect of the lower leg. There is diffuse subcutaneous edema. No radiopaque foreign body. IMPRESSION: 1. Soft tissue defect about the medial lower leg. No radiopaque foreign body or acute osseous abnormality. 2. Diffuse subcutaneous edema. 3. No acute osseous abnormalities. Electronically Signed   By: Melanie  Sanford M.D.   On: 05/20/2022 22:10   DG Pelvis Portable  Result Date: 05/20/2022 CLINICAL DATA:  Trauma, dog bite. EXAM: PORTABLE PELVIS 1-2 VIEWS COMPARISON:  None Available. FINDINGS: Intact bilateral hip arthroplasties. No periprosthetic lucency or fracture. Intact bony pelvis, no rami fractures. There is mild degenerative change of the pubic symphysis which remains congruent. No sacroiliac joint diastasis. Subcutaneous edema about both hips and lateral upper thighs. IMPRESSION: 1. Intact bilateral hip arthroplasties. No acute fracture. 2. Subcutaneous edema about the hips and lateral upper thighs. Electronically Signed   By: Melanie  Sanford M.D.   On: 05/20/2022 22:09   DG Chest Port 1 View  Result Date: 05/20/2022 CLINICAL DATA:  Trauma, dog bite. EXAM: PORTABLE CHEST 1 VIEW COMPARISON:  None Available. FINDINGS: The heart is normal in size. There is aortic tortuosity. No pneumothorax or pleural effusion. No focal airspace disease. On limited assessment, no acute osseous findings. IMPRESSION: 1. No acute traumatic injury to the thorax. 2. Tortuous aorta. Electronically Signed   By: Melanie  Sanford M.D.   On: 05/20/2022 22:09    ROS Blood pressure (!) 156/76, pulse 74, temperature 98.3 F (36.8 C), temperature source Oral, resp. rate 16, height 5' 1" (1.549 m), weight 59 kg, SpO2 99 %. Endorses discomfort throughout all extremities, relatively well-controlled.  Physical Exam Constitutional:      General: She is not in acute distress.    Appearance: Normal appearance.  Eyes:     Extraocular Movements: Extraocular movements intact.     Pupils: Pupils are  equal, round, and reactive to light.  Musculoskeletal:     Cervical back: Normal range of motion.     Comments: LUE: Wound VAC and sugar-tong splint currently in place, will not move.  Secured with Ace wrap.  Neurovascularly intact.  Active flexion of digits intact, but diminished ability to extend.  PROM all intact.  Neurological:     General: No focal deficit present.     Mental Status: She is alert and oriented to person, place, and time.  Psychiatric:        Behavior: Behavior normal.    Assessment/Plan:  LUE soft tissue defect: - Reviewed images obtained in ED as well as the operative report.   - Discussed case with Dr.   Dillingham who also reviewed the images of the soft tissue defect and will make plans to take patient to the operating room next week for placement of tissue matrix and VAC change with possibility for additional debridement.  Will depend on how it looks at time of VAC removal.  - Will meet with patient to discuss details of surgery with tomorrow.    - Will plan to hold Lovenox day of surgery. Continued antibiotics per primary team.    April Bowman 05/21/2022, 1:17 PM      

## 2022-05-21 NOTE — ED Notes (Addendum)
[employer] Lamarr Lulas) presenting to ED requesting pictures of wounds or patient's ID.  This RN explained pt was no longer on unit and that we could not provide any pictures from ED.  This RN further explained that per charge RN, Medical Records would be the ones to release that info with a court order.  Mr. Janet Berlin provided with Medical Records Office number to call in morning.  Mr. Janet Berlin left business cards for patient.  This RN called inpatient unit that pt will transfer to to obtain tube station to send cards.

## 2022-05-21 NOTE — Consult Note (Signed)
Reason for Consult: LUE soft tissue defect Referring Physician: Charma Igo, PA-C  April Bowman is an 67 y.o. female.  HPI: Patient is a pleasant 67 year old female who presented to the ED as a level 2 trauma 05/30/2022 after sustaining puncture wounds to all 4 extremities as well as a degloving injury to the LUE after being attacked by a 60 lb dog.    She was admitted by Dr. Fredricka Bonine and then taken to the operating room at approximately 2 AM this morning by Dr. Kerry Fort for irrigation and debridement of her upper extremity wounds bilaterally.  The large soft tissue defect around the left wound was partially closed posteriorly and defect measured 15 x 13 cm, 2 cm depth.  Adaptic was placed over the wound bed followed by wound VAC.  After conclusion of surgery, Dr .Kerry Fort noted that patient will require soft tissue coverage including possible flap.  Patient was placed in a sugar-tong splint in addition to having wound VAC placement.  Today, patient is resting in bed accompanied by family at bedside.  She states that her pain is relatively well-controlled.    History reviewed. No pertinent past medical history.  Past Surgical History:  Procedure Laterality Date   AUGMENTATION MAMMAPLASTY     hip replacements Bilateral    INCISION AND DRAINAGE OF WOUND Left 05/20/2022   Procedure: IRRIGATION AND DEBRIDEMENT AND WOUND VAC PLACEMENT LEFT ARM;  Surgeon: Ramon Dredge, MD;  Location: [facility] OR;  Service: Orthopedics;  Laterality: Left;   MINOR IRRIGATION AND DEBRIDEMENT OF WOUND Right 05/20/2022   Procedure: IRRIGATION AND DEBRIDEMENT OF WOUND RIGHT ARM;  Surgeon: Ramon Dredge, MD;  Location: [facility] OR;  Service: Orthopedics;  Laterality: Right;    Family History  Problem Relation Age of Onset   Breast cancer Neg Hx     Social History:  reports that she quit smoking about 12 years ago. Her smoking use included cigarettes. She has a 17.50 pack-year smoking history. She has  never used smokeless tobacco. No history on file for alcohol use and drug use.  Allergies: No Known Allergies  Medications: I have reviewed the patient's current medications.  Results for orders placed or performed during the hospital encounter of 05/20/22 (from the past 48 hour(s))  Comprehensive metabolic panel     Status: Abnormal   Collection Time: 05/20/22  9:01 PM  Result Value Ref Range   Sodium 135 135 - 145 mmol/L   Potassium 4.1 3.5 - 5.1 mmol/L   Chloride 100 98 - 111 mmol/L   CO2 18 (L) 22 - 32 mmol/L   Glucose, Bld 135 (H) 70 - 99 mg/dL    Comment: Glucose reference range applies only to samples taken after fasting for at least 8 hours.   BUN 16 8 - 23 mg/dL   Creatinine, Ser 4.54 0.44 - 1.00 mg/dL   Calcium 9.0 8.9 - 09.8 mg/dL   Total Protein 6.6 6.5 - 8.1 g/dL   Albumin 3.6 3.5 - 5.0 g/dL   AST 29 15 - 41 U/L   ALT 19 0 - 44 U/L   Alkaline Phosphatase 87 38 - 126 U/L   Total Bilirubin 0.4 0.3 - 1.2 mg/dL   GFR, Estimated >11 >91 mL/min    Comment: (NOTE) Calculated using the CKD-EPI Creatinine Equation (2021)    Anion gap 17 (H) 5 - 15    Comment: Performed at [facility], [address] [place], [place] [address]  CBC     Status:  Abnormal   Collection Time: 05/20/22  9:01 PM  Result Value Ref Range   WBC 14.8 (H) 4.0 - 10.5 K/uL   RBC 4.49 3.87 - 5.11 MIL/uL   Hemoglobin 14.0 12.0 - 15.0 g/dL   HCT 96.0 45.4 - 09.8 %   MCV 92.0 80.0 - 100.0 fL   MCH 31.2 26.0 - 34.0 pg   MCHC 33.9 30.0 - 36.0 g/dL   RDW 11.9 14.7 - 82.9 %   Platelets 352 150 - 400 K/uL   nRBC 0.0 0.0 - 0.2 %    Comment: Performed at [facility], [address], [place], [place] [address]  Ethanol     Status: None   Collection Time: 05/20/22  9:01 PM  Result Value Ref Range   Alcohol, Ethyl (B) <10 <10 mg/dL    Comment: (NOTE) Lowest detectable limit for serum alcohol is 10 mg/dL.  For medical purposes only. Performed at [facility], [address],  [place], [place] [address]   Lactic acid, plasma     Status: Abnormal   Collection Time: 05/20/22  9:01 PM  Result Value Ref Range   Lactic Acid, Venous 6.1 (HH) 0.5 - 1.9 mmol/L    Comment: CRITICAL RESULT CALLED TO, READ BACK BY AND VERIFIED WITH T. WALSTON,RN. 2144 05/20/22. LPAIT Performed at [facility], [address], [place], [place] [address]   Protime-INR     Status: None   Collection Time: 05/20/22  9:01 PM  Result Value Ref Range   Prothrombin Time 12.5 11.4 - 15.2 seconds   INR 0.9 0.8 - 1.2    Comment: (NOTE) INR goal varies based on device and disease states. Performed at [facility], [address] [place], [place] [address]   Type and screen [facility]     Status: None   Collection Time: 05/20/22  9:01 PM  Result Value Ref Range   ABO/RH(D) O POS    Antibody Screen NEG    Sample Expiration      05/23/2022,2359 Performed at [facility], [address] [place], [place] [address]   ABO/Rh     Status: None   Collection Time: 05/20/22  9:06 PM  Result Value Ref Range   ABO/RH(D)      O POS Performed at [facility], [address], [place], [place] [address]   I-Stat Chem 8, ED     Status: Abnormal   Collection Time: 05/20/22  9:10 PM  Result Value Ref Range   Sodium 135 135 - 145 mmol/L   Potassium 4.1 3.5 - 5.1 mmol/L   Chloride 103 98 - 111 mmol/L   BUN 17 8 - 23 mg/dL   Creatinine, Ser 7.25 0.44 - 1.00 mg/dL   Glucose, Bld 366 (H) 70 - 99 mg/dL    Comment: Glucose reference range applies only to samples taken after fasting for at least 8 hours.   Calcium, Ion 0.97 (L) 1.15 - 1.40 mmol/L   TCO2 18 (L) 22 - 32 mmol/L   Hemoglobin 14.3 12.0 - 15.0 g/dL   HCT 44.0 34.7 - 42.5 %  Urinalysis, Routine w reflex microscopic -Urine, Clean Catch     Status: Abnormal   Collection Time: 05/20/22  9:58 PM  Result Value Ref Range   Color, Urine STRAW (A) YELLOW   APPearance CLEAR CLEAR   Specific Gravity, Urine 1.009 1.005  - 1.030   pH 5.0 5.0 -  8.0   Glucose, UA NEGATIVE NEGATIVE mg/dL   Hgb urine dipstick LARGE (A) NEGATIVE   Bilirubin Urine NEGATIVE NEGATIVE   Ketones, ur NEGATIVE NEGATIVE mg/dL   Protein, ur NEGATIVE NEGATIVE mg/dL   Nitrite NEGATIVE NEGATIVE   Leukocytes,Ua NEGATIVE NEGATIVE   RBC / HPF 0-5 0 - 5 RBC/hpf   WBC, UA 0-5 0 - 5 WBC/hpf   Bacteria, UA NONE SEEN NONE SEEN   Squamous Epithelial / HPF 0-5 0 - 5 /HPF   Mucus PRESENT     Comment: Performed at [facility], [address], [place], [place] [address]  CBC     Status: Abnormal   Collection Time: 05/21/22  6:19 AM  Result Value Ref Range   WBC 12.5 (H) 4.0 - 10.5 K/uL   RBC 3.88 3.87 - 5.11 MIL/uL   Hemoglobin 11.8 (L) 12.0 - 15.0 g/dL   HCT 60.4 54.0 - 98.1 %   MCV 93.0 80.0 - 100.0 fL   MCH 30.4 26.0 - 34.0 pg   MCHC 32.7 30.0 - 36.0 g/dL   RDW 19.1 47.8 - 29.5 %   Platelets 285 150 - 400 K/uL   nRBC 0.0 0.0 - 0.2 %    Comment: Performed at [facility], [address] [place], [place] [address]  Basic metabolic panel     Status: Abnormal   Collection Time: 05/21/22  6:19 AM  Result Value Ref Range   Sodium 131 (L) 135 - 145 mmol/L   Potassium 4.7 3.5 - 5.1 mmol/L   Chloride 100 98 - 111 mmol/L   CO2 20 (L) 22 - 32 mmol/L   Glucose, Bld 151 (H) 70 - 99 mg/dL    Comment: Glucose reference range applies only to samples taken after fasting for at least 8 hours.   BUN 12 8 - 23 mg/dL   Creatinine, Ser 8.65 0.44 - 1.00 mg/dL   Calcium 8.2 (L) 8.9 - 10.3 mg/dL   GFR, Estimated >78 >46 mL/min    Comment: (NOTE) Calculated using the CKD-EPI Creatinine Equation (2021)    Anion gap 11 5 - 15    Comment: Performed at [facility], [address] [place], [place] [address]  HIV Antibody (routine testing w rflx)     Status: None   Collection Time: 05/21/22  6:19 AM  Result Value Ref Range   HIV Screen 4th Generation wRfx Non Reactive Non Reactive    Comment: Performed at [facility], [address], [place], [place] [address]    CT ELBOW LEFT WO CONTRAST  Result Date: 05/21/2022 CLINICAL DATA:  Joint effusion. Evaluate elbow for intra-articular gas. Dog bite. EXAM: CT OF THE UPPER LEFT EXTREMITY WITHOUT CONTRAST TECHNIQUE: Multidetector CT imaging of the upper left extremity was performed according to the standard protocol. RADIATION DOSE REDUCTION: This exam was performed according to the departmental dose-optimization program which includes automated exposure control, adjustment of the mA and/or kV according to patient size and/or use of iterative reconstruction technique. COMPARISON:  Radiographs 05/20/2022 FINDINGS: Bones/Joint/Cartilage No acute fracture or dislocation. There is mild loss of definition of the anterior cortex of the distal humeral diaphysis in the area of large soft tissue defect in the distal upper forearm reaching the anterior surface of the humerus (series 8/image 57). Soft tissue edema and gas also extend to the posteromedial humerus epicondyle without definite cortical irregularity. No intra-articular gas or definite elbow joint effusion. Ligaments Suboptimally assessed by CT. Muscles  and Tendons Poorly assessed via CT. There is intramuscular gas within the anterior and posterior compartments of the upper arm and visualized proximal forearm. Soft tissues Extensive soft tissue lacerations about the anterior and posterior arm in the region of the elbow. The lacerations extend to bone at the medial humeral epicondyle and about the anterior distal humeral diaphysis. IMPRESSION: 1. Extensive soft tissue lacerations about the anterior and posterior arm in the region of the elbow. The lacerations extend to bone at the medial humeral epicondyle and about the anterior distal humeral diaphysis. There is mild loss of definition of the anterior cortex of the distal humeral diaphysis in the area of large soft tissue defect without definite fracture. 2. Intramuscular and subcutaneous gas  within the anterior and posterior compartments of the upper arm and visualized proximal forearm. No intra-articular gas or definite elbow joint effusion. Electronically Signed   By: Minerva Fester M.D.   On: 05/21/2022 00:14   DG Wrist Complete Left  Result Date: 05/20/2022 CLINICAL DATA:  Dog bite to the left wrist. EXAM: LEFT WRIST - COMPLETE 3+ VIEW COMPARISON:  Left upper extremity radiograph dated 05/20/2022. FINDINGS: Comminuted and displaced fracture of the distal ulna as seen on the earlier radiograph. No other acute fracture. There is no dislocation. The bones are osteopenic. There is laceration of the skin and superficial soft tissues of the forearm. IMPRESSION: Comminuted and displaced fracture of the distal ulna. Electronically Signed   By: Elgie Collard M.D.   On: 05/20/2022 22:31   DG Humerus Left  Result Date: 05/20/2022 CLINICAL DATA:  Trauma, dog bite. EXAM: LEFT HUMERUS - 2+ VIEW COMPARISON:  None Available. FINDINGS: There is no evidence of fracture or other focal bone lesions. Large soft tissue defect about the distal lateral aspect of the upper arm towards the elbow. No radiopaque foreign body. Tourniquet overlies the mid arm. IMPRESSION: 1. Large soft tissue defect about the distal lateral upper arm. No radiopaque foreign body or acute fracture. 2. Tourniquet overlies the mid arm. Electronically Signed   By: Narda Rutherford M.D.   On: 05/20/2022 22:16   DG Forearm Left  Result Date: 05/20/2022 CLINICAL DATA:  Trauma, dog bite. EXAM: LEFT FOREARM - 2 VIEW COMPARISON:  None Available. FINDINGS: Displaced fracture of the distal ulna. 1/2 shaft with radial displacement of distal fracture fragment. Deformity of the distal radius has the appearance of a remote injury. Elbow alignment is maintained. There is diffuse subcutaneous edema and multifocal soft tissue irregularity. There is a punctate round density within the ulnar soft tissues at is likely a soft tissue phleboliths, although  small foreign body is not entirely excluded. IMPRESSION: 1. Displaced distal ulna fracture. Findings suggestive of remote distal radius fracture. 2. Punctate round density in the ulnar soft tissues is likely a soft tissue phlebolith, although small foreign body is not entirely excluded. 3. Diffuse subcutaneous edema and multifocal soft tissue irregularity. Electronically Signed   By: Narda Rutherford M.D.   On: 05/20/2022 22:15   DG Forearm Right  Result Date: 05/20/2022 CLINICAL DATA:  Trauma, dog bite. EXAM: RIGHT FOREARM - 2 VIEW COMPARISON:  None Available. FINDINGS: There is no evidence of fracture or other focal bone lesions. Wrist and elbow alignment are maintained. There is soft tissue edema at multiple sites. No radiopaque foreign body. Antecubital IVs. IMPRESSION: Soft tissue edema without radiopaque foreign body or acute fracture. Electronically Signed   By: Narda Rutherford M.D.   On: 05/20/2022 22:13   DG Knee 2  Views Left  Result Date: 05/20/2022 CLINICAL DATA:  Trauma, dog bite. EXAM: LEFT KNEE - 1-2 VIEW COMPARISON:  None Available. FINDINGS: No fracture or dislocation. Moderate degenerative change with tricompartmental peripheral spurring and medial tibiofemoral joint space narrowing. Suspect sequela of remote injury in the proximal tibia. Tibial ghost track in the metaphysis. No significant joint effusion. Subcutaneous edema medial and laterally. No soft tissue foreign body. IMPRESSION: 1. No acute fracture or dislocation. 2. Moderate knee degenerative change and probable sequela of remote injury in the proximal tibia. 3. Subcutaneous edema. Electronically Signed   By: Narda Rutherford M.D.   On: 05/20/2022 22:12   DG Tibia/Fibula Right  Result Date: 05/20/2022 CLINICAL DATA:  Trauma, dog bite. EXAM: RIGHT TIBIA AND FIBULA - 2 VIEW COMPARISON:  None Available. FINDINGS: There is no evidence of fracture or other focal bone lesions. The cortical margins of the tibia and fibular intact. Soft  tissue defect about the medial aspect of the lower leg. There is diffuse subcutaneous edema. No radiopaque foreign body. IMPRESSION: 1. Soft tissue defect about the medial lower leg. No radiopaque foreign body or acute osseous abnormality. 2. Diffuse subcutaneous edema. 3. No acute osseous abnormalities. Electronically Signed   By: Narda Rutherford M.D.   On: 05/20/2022 22:10   DG Pelvis Portable  Result Date: 05/20/2022 CLINICAL DATA:  Trauma, dog bite. EXAM: PORTABLE PELVIS 1-2 VIEWS COMPARISON:  None Available. FINDINGS: Intact bilateral hip arthroplasties. No periprosthetic lucency or fracture. Intact bony pelvis, no rami fractures. There is mild degenerative change of the pubic symphysis which remains congruent. No sacroiliac joint diastasis. Subcutaneous edema about both hips and lateral upper thighs. IMPRESSION: 1. Intact bilateral hip arthroplasties. No acute fracture. 2. Subcutaneous edema about the hips and lateral upper thighs. Electronically Signed   By: Narda Rutherford M.D.   On: 05/20/2022 22:09   DG Chest Port 1 View  Result Date: 05/20/2022 CLINICAL DATA:  Trauma, dog bite. EXAM: PORTABLE CHEST 1 VIEW COMPARISON:  None Available. FINDINGS: The heart is normal in size. There is aortic tortuosity. No pneumothorax or pleural effusion. No focal airspace disease. On limited assessment, no acute osseous findings. IMPRESSION: 1. No acute traumatic injury to the thorax. 2. Tortuous aorta. Electronically Signed   By: Narda Rutherford M.D.   On: 05/20/2022 22:09    ROS Blood pressure (!) 156/76, pulse 74, temperature 98.3 F (36.8 C), temperature source Oral, resp. rate 16, height 5\' 1"  (1.549 m), weight 59 kg, SpO2 99 %. Endorses discomfort throughout all extremities, relatively well-controlled.  Physical Exam Constitutional:      General: She is not in acute distress.    Appearance: Normal appearance.  Eyes:     Extraocular Movements: Extraocular movements intact.     Pupils: Pupils are  equal, round, and reactive to light.  Musculoskeletal:     Cervical back: Normal range of motion.     Comments: LUE: Wound VAC and sugar-tong splint currently in place, will not move.  Secured with Ace wrap.  Neurovascularly intact.  Active flexion of digits intact, but diminished ability to extend.  PROM all intact.  Neurological:     General: No focal deficit present.     Mental Status: She is alert and oriented to person, place, and time.  Psychiatric:        Behavior: Behavior normal.    Assessment/Plan:  LUE soft tissue defect: - Reviewed images obtained in ED as well as the operative report.   - Discussed case with Dr.  Dillingham who also reviewed the images of the soft tissue defect and will make plans to take patient to the operating room next week for placement of tissue matrix and VAC change with possibility for additional debridement.  Will depend on how it looks at time of VAC removal.  - Will meet with patient to discuss details of surgery with tomorrow.    - Will plan to hold Lovenox day of surgery. Continued antibiotics per primary team.    April Bowman 05/21/2022, 1:17 PM

## 2022-05-21 NOTE — Progress Notes (Signed)
PT Cancellation Note  Patient Details Name: April Bowman MRN: [id number] DOB: 10/10/1955   Cancelled Treatment:    Reason Eval/Treat Not Completed: Other (comment).  In with MD's and cannot see currently, retry at another time.   Ivar Drape 05/21/2022, 11:08 AM  Samul Dada, PT PhD Acute Rehab Dept. Number: [facility] [id number] and [facility] [phone]

## 2022-05-21 NOTE — TOC CAGE-AID Note (Signed)
Transition of Care [facility]) - CAGE-AID Screening   Patient Details  Name: April Bowman MRN: [id number] Date of Birth: 1955/04/18  Transition [facility]) CM/SW Contact:    Leota Sauers, RN Phone Number: 05/21/2022, 7:22 PM   Clinical Narrative:  Patient denies alcohol and drug use. Education not offered at this time.   CAGE-AID Screening:    Have You Ever Felt You Ought to Cut Down on Your Drinking or Drug Use?: No Have People Annoyed You By Critizing Your Drinking Or Drug Use?: No Have You Felt Bad Or Guilty About Your Drinking Or Drug Use?: No Have You Ever Had a Drink or Used Drugs First Thing In The Morning to Steady Your Nerves or to Get Rid of a Hangover?: No CAGE-AID Score: 0  Substance Abuse Education Offered: No

## 2022-05-21 NOTE — Transfer of Care (Signed)
Immediate Anesthesia Transfer of Care Note  Patient: April Bowman  Procedure(s) Performed: IRRIGATION AND DEBRIDEMENT AND WOUND VAC PLACEMENT LEFT ARM (Left: Arm Lower) IRRIGATION AND DEBRIDEMENT OF WOUND RIGHT ARM (Right: Arm Lower)  Patient Location: PACU  Anesthesia Type:General  Level of Consciousness: awake and alert   Airway & Oxygen Therapy: Patient Spontanous Breathing and Patient connected to nasal cannula oxygen  Post-op Assessment: Report given to RN and Post -op Vital signs reviewed and stable  Post vital signs: Reviewed and stable  Last Vitals:  Vitals Value Taken Time  BP 128/79 05/21/22 0232  Temp    Pulse 87 05/21/22 0234  Resp 16 05/21/22 0234  SpO2 100 % 05/21/22 0234  Vitals shown include unvalidated device data.  Last Pain:  Vitals:   05/20/22 2103  TempSrc:   PainSc: 10-Worst pain ever         Complications: No notable events documented.

## 2022-05-21 NOTE — TOC Initial Note (Signed)
Transition of Care [facility]) - Initial/Assessment Note    Patient Details  Name: April Bowman MRN: [id number] Date of Birth: 1955-07-10  [facility]) CM/SW Contact:    Glennon Mac, RN Phone Number: 05/21/2022, 3:55 PM  Clinical Narrative:                 Pt is a 67 y/o female presenting after being mauled by a dog resulting in multiple bites with most significant being a degloving injury around L elbow. Underwent I&D of L UE wounds on 5/8 with wound vac placement.  PTA, pt independent and living at home with 18yo and 22yo grandchildren and roommate.  Patient states she will have intermittent assistance at discharge. PT recommending SNF, but patient feels she will not need that; OT states discharge recs are to be determined. Noted plan for further surgery next week with VAC change.  Will follow as patient progresses.   MD: Please notify TOC Case Manager if patient will need wound VAC for home.   Expected Discharge Plan: Home w Home Health Services Barriers to Discharge: Continued Medical Work up   Patient Goals and CMS Choice Patient states their goals for this hospitalization and ongoing recovery are:: to go home          Expected Discharge Plan and Services   Discharge Planning Services: CM Consult   Living arrangements for the past 2 months: Apartment                                      Prior Living Arrangements/Services Living arrangements for the past 2 months: Apartment Lives with:: Relatives, Roommate Patient language and need for interpreter reviewed:: Yes Do you feel safe going back to the place where you live?: Yes      Need for Family Participation in Patient Care: Yes (Comment) Care giver support system in place?: Yes (comment)   Criminal Activity/Legal Involvement Pertinent to Current Situation/Hospitalization: No - Comment as needed  Activities of Daily Living Home Assistive Devices/Equipment: None ADL Screening (condition at time of  admission) Patient's cognitive ability adequate to safely complete daily activities?: Yes Is the patient deaf or have difficulty hearing?: No Does the patient have difficulty seeing, even when wearing glasses/contacts?: No Does the patient have difficulty concentrating, remembering, or making decisions?: No Patient able to express need for assistance with ADLs?: Yes Does the patient have difficulty dressing or bathing?: No Independently performs ADLs?: Yes (appropriate for developmental age) Does the patient have difficulty walking or climbing stairs?: No Weakness of Legs: Both Weakness of Arms/Hands: Both                   Emotional Assessment Appearance:: Appears stated age Attitude/Demeanor/Rapport: Engaged Affect (typically observed): Accepting Orientation: : Oriented to Self, Oriented to Place, Oriented to  Time, Oriented to Situation      Admission diagnosis:  Trauma [T14.90XA] Dog bite, initial encounter [W09.0XXA] Dog bite of left upper extremity [S41.152A, W54.0XXA] Patient Active Problem List   Diagnosis Date Noted   Dog bite of left upper extremity 05/21/2022   Trauma 05/20/2022   Insomnia 06/02/2017   OSA (obstructive sleep apnea) 06/02/2017   PCP:  Pcp, No Pharmacy:   [id number] [place], [place] - [address] [place] [address] Phone: [phone] Fax: [phone]  [id number] [place], [place] - [address] AT [facility] [address] [place] [address] Phone: [phone] Fax: [phone]  [facility] [address] [place] [address] Phone: [phone] Fax: [phone]     Social Determinants of Health (SDOH) Social History: SDOH Screenings   Food Insecurity: No Food Insecurity (05/21/2022)  Housing: Low Risk  (05/21/2022)  Transportation Needs: No Transportation Needs  (05/21/2022)  Utilities: Not At Risk (05/21/2022)  Tobacco Use: Medium Risk (05/21/2022)   SDOH Interventions:     Readmission Risk Interventions     No data to display         Quintella Baton, RN, BSN  Trauma/Neuro ICU Case Manager [phone]

## 2022-05-21 NOTE — Evaluation (Signed)
Occupational Therapy Evaluation Patient Details Name: April Bowman MRN: [id number] DOB: May 01, 1955 Today's Date: 05/21/2022   History of Present Illness Pt is a 67 y/o female presenting after being mauled by a dog resulting in multiple bites with most significant being a degloving injury around L elbow. Underwent I&D of L UE wounds on 5/8 with wound vac placement. PMH: B hip replacements, insomnia   Clinical Impression   PTA, pt lives with daughter and grandchildren, reports typically Modified Independent with ADLs/mobility though unclear if pt using walker consistently for mobility. Limited OT evaluation completed this AM due to pt pain and anxiety regarding pain. Pt requesting bathroom assist though declined OOB transfer to [facility] with preference for bedpan use despite education on need for OOB. Pt able to move well in the bed though due to impaired L UE use, requires Mod-Max A for UB/LB ADLs bed level. Pt reports she does not have access to assist during the day while daughter at work - will need to be fairly independent to DC home. Will finalize DC recs when pt agreeable for OOB ADL assessment.      Recommendations for follow up therapy are one component of a multi-disciplinary discharge planning process, led by the attending physician.  Recommendations may be updated based on patient status, additional functional criteria and insurance authorization.   Assistance Recommended at Discharge Frequent or constant Supervision/Assistance  Patient can return home with the following A lot of help with walking and/or transfers;A lot of help with bathing/dressing/bathroom    Functional Status Assessment  Patient has had a recent decline in their functional status and demonstrates the ability to make significant improvements in function in a reasonable and predictable amount of time.  Equipment Recommendations  Other (comment) (TBD pending OOB ADL assessment)    Recommendations for Other Services        Precautions / Restrictions Precautions Precautions: Fall;Other (comment) Precaution Comments: L UE wound vac Required Braces or Orthoses: Sling Restrictions Weight Bearing Restrictions: Yes LUE Weight Bearing: Non weight bearing      Mobility Bed Mobility Overal bed mobility: Needs Assistance             General bed mobility comments: able to bridge in bed without assist. adamantly declined EOB attempts.    Transfers                   General transfer comment: declined      Balance Overall balance assessment:  (to be further assessed)                                         ADL either performed or assessed with clinical judgement   ADL Overall ADL's : Needs assistance/impaired Eating/Feeding: Minimal assistance;Bed level Eating/Feeding Details (indicate cue type and reason): requesting assist to pour water into cup. RN assisting w/ cup to mouth for meds Grooming: Minimal assistance;Bed level   Upper Body Bathing: Maximal assistance;Bed level   Lower Body Bathing: Moderate assistance;Bed level   Upper Body Dressing : Maximal assistance;Bed level Upper Body Dressing Details (indicate cue type and reason): assist for gown/sling. pt noted to have wet gown though declined OT assist to change gown - reported "why change it now when they are planning to give me a bath later?" Lower Body Dressing: Maximal assistance;Bed level     Toilet Transfer Details (indicate cue type and reason): On entry, pt  reports calling out for bathroom assist 3x. Inquired if pt would like to use toilet or BSC with pt reporting commode. Placed BSC at bedside though pt adamantly declined that she could not get OOB despite OT education on assist and importance of mobility attempts. Pt requesting bedpan. Pt able to easily bridge in bed to assist for bedpan placement. Reinforced need for alternative bathroom strategies Toileting- Clothing Manipulation and Hygiene:  Moderate assistance;Bed level Toileting - Clothing Manipulation Details (indicate cue type and reason): pt able to use toilet paper to complete peri care bed level after bedpan use/urination. Pt declined additional washcloth, etc       General ADL Comments: Limited to bed level ADL assessment due to pt anxiety w/ pain anticipation and potential dizziness     Vision Ability to See in Adequate Light: 0 Adequate Patient Visual Report: No change from baseline Vision Assessment?: No apparent visual deficits     Perception     Praxis      Pertinent Vitals/Pain Pain Assessment Pain Assessment: Faces Faces Pain Scale: Hurts little more Pain Location: L UE w/ movement Pain Descriptors / Indicators: Grimacing Pain Intervention(s): Monitored during session, Patient requesting pain meds-RN notified, RN gave pain meds during session, Repositioned     Hand Dominance Right   Extremity/Trunk Assessment Upper Extremity Assessment Upper Extremity Assessment: LUE deficits/detail LUE Deficits / Details: ace wrap from midhand to mid upper arm, splinted and in sling. Adjusted sling, educated on elevation for swelling and better positioning w UE. able to wiggle first 1-2 digits, passive assist needed 3-5 digits but WFL. Pt inquiring about using R hand to stretch L digits w/ OT encouragement for pt to do within tolerance. LUE Coordination: decreased fine motor;decreased gross motor   Lower Extremity Assessment Lower Extremity Assessment: Defer to PT evaluation   Cervical / Trunk Assessment Cervical / Trunk Assessment: Normal   Communication Communication Communication: No difficulties   Cognition Arousal/Alertness: Awake/alert Behavior During Therapy: WFL for tasks assessed/performed, Anxious Overall Cognitive Status: No family/caregiver present to determine baseline cognitive functioning                                 General Comments: able to follow directions, express needs  though some decreased insight into need to move and that pain will likely be an ongoing issue to address     General Comments       Exercises     Shoulder Instructions      Home Living Family/patient expects to be discharged to:: Private residence Living Arrangements: Children;Other relatives (grandchildren) Available Help at Discharge: Family;Available PRN/intermittently Type of Home: House Home Access: Level entry     Home Layout: One level     Bathroom Shower/Tub: Chief Strategy Officer: Standard     Home Equipment: Agricultural consultant (2 wheels);BSC/3in1   Additional Comments: daughter works during the day. pt reports no other assist available during the day      Prior Functioning/Environment Prior Level of Function : Independent/Modified Independent             Mobility Comments: pt reports getting RW from prior hip sx 4 years ago - unclear if pt still using RW consistently in the home. when asked for clarification, pt reports "i try to be careful" ADLs Comments: MOD I for ADLs        OT Problem List: Decreased range of motion;Decreased strength;Decreased activity tolerance;Decreased coordination;Decreased knowledge  of use of DME or AE;Decreased knowledge of precautions;Impaired UE functional use;Pain      OT Treatment/Interventions: Self-care/ADL training;Therapeutic exercise;Energy conservation;DME and/or AE instruction;Therapeutic activities;Patient/family education    OT Goals(Current goals can be found in the care plan section) Acute Rehab OT Goals Patient Stated Goal: not move to avoid pain increase OT Goal Formulation: With patient Time For Goal Achievement: 06/04/22 Potential to Achieve Goals: Good  OT Frequency: Min 2X/week    Co-evaluation              AM-PAC OT "6 Clicks" Daily Activity     Outcome Measure Help from another person eating meals?: A Little Help from another person taking care of personal grooming?: A Little Help  from another person toileting, which includes using toliet, bedpan, or urinal?: A Lot Help from another person bathing (including washing, rinsing, drying)?: A Lot Help from another person to put on and taking off regular upper body clothing?: A Lot Help from another person to put on and taking off regular lower body clothing?: A Lot 6 Click Score: 14   End of Session Equipment Utilized During Treatment: Oxygen Nurse Communication: Mobility status;Patient requests pain meds;Weight bearing status;Precautions  Activity Tolerance: Patient limited by pain;Other (comment) (limited by anxiety) Patient left: in bed;with call bell/phone within reach  OT Visit Diagnosis: Other abnormalities of gait and mobility (R26.89);Muscle weakness (generalized) (M62.81);Pain Pain - Right/Left: Left Pain - part of body: Arm                Time: 1610-9604 OT Time Calculation (min): 21 min Charges:  OT General Charges $OT Visit: 1 Visit OT Evaluation $OT Eval Moderate Complexity: 1 Mod  Bradd Canary, OTR/L Acute Rehab Services Office: [phone]   Lorre Munroe 05/21/2022, 8:59 AM

## 2022-05-21 NOTE — Plan of Care (Signed)
  Problem: Pain Managment: Goal: General experience of comfort will improve Outcome: Progressing   Problem: Skin Integrity: Goal: Risk for impaired skin integrity will decrease Outcome: Progressing   

## 2022-05-21 NOTE — ED Provider Notes (Signed)
April Bowman EMERGENCY DEPARTMENT AT [facility] Provider Note   CSN: [id number] Arrival date & time: 05/20/22  2107     History  Chief Complaint  Patient presents with   Animal Bite    April Bowman is a 67 y.o. female.  67 year old female with prior medical history as detailed below presents for evaluation.  Patient reports that her neighbors pitbull dog bit her.  Patient with multiple dog bites to all extremities.  Significant soft tissue defect of the left upper extremity noted by EMS.  Per EMS, there was some degree of arterial bleeding.  Tourniquet was placed to the left proximal upper arm prior to arrival.  Patient with additional puncture wounds from dog bite to the left distal forearm, right forearm, left lateral knee, right lower extremity.  Patient without injury to the thorax.  She denies chest pain or shortness of breath.  Dog is reportedly up-to-date on vaccinations.  Law enforcement /animal control is involved.    The history is provided by the patient and medical records.       Home Medications Prior to Admission medications   Medication Sig Start Date End Date Taking? Authorizing Provider  amoxicillin-clavulanate (AUGMENTIN) 875-125 MG tablet Take 1 tablet by mouth every 12 (twelve) hours. 09/03/21   Vanetta Mulders, MD  COVID-19 mRNA bivalent vaccine, Pfizer, (PFIZER COVID-19 VAC BIVALENT) injection Inject into the muscle. 02/20/21   Judyann Munson, MD  [employer] D3 1000 units capsule Take 1,000 Units by mouth daily. 05/12/17   [provider]  estradiol (ESTRACE VAGINAL) 0.1 MG/GM vaginal cream INSERT 1 GRAM VAGINALLY 3 TIMES PER WEEK 05/02/16   [provider]  ibuprofen (ADVIL,MOTRIN) 800 MG tablet Take 800 mg by mouth 3 (three) times daily as needed. 04/20/17   [provider]  MYRBETRIQ 50 MG TB24 tablet Take 50 mg by mouth daily. 05/08/17   [provider]  nortriptyline (PAMELOR) 75 MG capsule Take 75 mg by mouth at  bedtime. 05/08/17   [provider]      Allergies    Patient has no known allergies.    Review of Systems   Review of Systems  All other systems reviewed and are negative.   Physical Exam Updated Vital Signs BP (!) 148/83   Pulse 86   Temp 98.6 F (37 C) (Oral)   Resp 18   Ht 5\' 1"  (1.549 m)   Wt 59 kg   SpO2 96%   BMI 24.58 kg/m  Physical Exam Vitals and nursing note reviewed.  Constitutional:      General: She is not in acute distress.    Appearance: She is well-developed.     Comments: Alert, uncomfortable, visible wounds to all 4 extremities  HENT:     Head: Normocephalic and atraumatic.  Eyes:     Conjunctiva/sclera: Conjunctivae normal.     Pupils: Pupils are equal, round, and reactive to light.  Cardiovascular:     Rate and Rhythm: Normal rate and regular rhythm.     Heart sounds: Normal heart sounds.  Pulmonary:     Effort: Pulmonary effort is normal. No respiratory distress.     Breath sounds: Normal breath sounds.  Abdominal:     General: There is no distension.     Palpations: Abdomen is soft.     Tenderness: There is no abdominal tenderness.  Musculoskeletal:        General: No deformity.     Cervical back: Normal range of motion and neck supple.  Comments: Multiple dog bite injuries to all 4 extremities.  See images below  Large gaping soft tissue defect to the left upper arm crossing over the left elbow.  Multiple puncture wounds to the distal left forearm.  No active arterial bleeding with tourniquet released.  Puncture wounds to the right forearm.  Puncture wounds to the left lateral knee.  Lacerations to the right medial lower extremity.  All injuries appear to be consistent with dog bite.  Skin:    General: Skin is warm and dry.  Neurological:     General: No focal deficit present.     Mental Status: She is oriented to person, place, and time.                        ED Results / Procedures / Treatments    Labs (all labs ordered are listed, but only abnormal results are displayed) Labs Reviewed  COMPREHENSIVE METABOLIC PANEL - Abnormal; Notable for the following components:      Result Value   CO2 18 (*)    Glucose, Bld 135 (*)    Anion gap 17 (*)    All other components within normal limits  CBC - Abnormal; Notable for the following components:   WBC 14.8 (*)    All other components within normal limits  URINALYSIS, ROUTINE W REFLEX MICROSCOPIC - Abnormal; Notable for the following components:   Color, Urine STRAW (*)    Hgb urine dipstick LARGE (*)    All other components within normal limits  LACTIC ACID, PLASMA - Abnormal; Notable for the following components:   Lactic Acid, Venous 6.1 (*)    All other components within normal limits  I-STAT CHEM 8, ED - Abnormal; Notable for the following components:   Glucose, Bld 132 (*)    Calcium, Ion 0.97 (*)    TCO2 18 (*)    All other components within normal limits  ETHANOL  PROTIME-INR  HIV ANTIBODY (ROUTINE TESTING W REFLEX)  CBC  CBC  BASIC METABOLIC PANEL  TYPE AND SCREEN  ABO/RH    EKG None  Radiology DG Humerus Left  Result Date: 05/20/2022 CLINICAL DATA:  Trauma, dog bite. EXAM: LEFT HUMERUS - 2+ VIEW COMPARISON:  None Available. FINDINGS: There is no evidence of fracture or other focal bone lesions. Large soft tissue defect about the distal lateral aspect of the upper arm towards the elbow. No radiopaque foreign body. Tourniquet overlies the mid arm. IMPRESSION: 1. Large soft tissue defect about the distal lateral upper arm. No radiopaque foreign body or acute fracture. 2. Tourniquet overlies the mid arm. Electronically Signed   By: Narda Rutherford M.D.   On: 05/20/2022 22:16   DG Forearm Left  Result Date: 05/20/2022 CLINICAL DATA:  Trauma, dog bite. EXAM: LEFT FOREARM - 2 VIEW COMPARISON:  None Available. FINDINGS: Displaced fracture of the distal ulna. 1/2 shaft with radial displacement of distal fracture fragment.  Deformity of the distal radius has the appearance of a remote injury. Elbow alignment is maintained. There is diffuse subcutaneous edema and multifocal soft tissue irregularity. There is a punctate round density within the ulnar soft tissues at is likely a soft tissue phleboliths, although small foreign body is not entirely excluded. IMPRESSION: 1. Displaced distal ulna fracture. Findings suggestive of remote distal radius fracture. 2. Punctate round density in the ulnar soft tissues is likely a soft tissue phlebolith, although small foreign body is not entirely excluded. 3. Diffuse subcutaneous edema and multifocal  soft tissue irregularity. Electronically Signed   By: Narda Rutherford M.D.   On: 05/20/2022 22:15   DG Forearm Right  Result Date: 05/20/2022 CLINICAL DATA:  Trauma, dog bite. EXAM: RIGHT FOREARM - 2 VIEW COMPARISON:  None Available. FINDINGS: There is no evidence of fracture or other focal bone lesions. Wrist and elbow alignment are maintained. There is soft tissue edema at multiple sites. No radiopaque foreign body. Antecubital IVs. IMPRESSION: Soft tissue edema without radiopaque foreign body or acute fracture. Electronically Signed   By: Narda Rutherford M.D.   On: 05/20/2022 22:13   DG Knee 2 Views Left  Result Date: 05/20/2022 CLINICAL DATA:  Trauma, dog bite. EXAM: LEFT KNEE - 1-2 VIEW COMPARISON:  None Available. FINDINGS: No fracture or dislocation. Moderate degenerative change with tricompartmental peripheral spurring and medial tibiofemoral joint space narrowing. Suspect sequela of remote injury in the proximal tibia. Tibial ghost track in the metaphysis. No significant joint effusion. Subcutaneous edema medial and laterally. No soft tissue foreign body. IMPRESSION: 1. No acute fracture or dislocation. 2. Moderate knee degenerative change and probable sequela of remote injury in the proximal tibia. 3. Subcutaneous edema. Electronically Signed   By: Narda Rutherford M.D.   On: 05/20/2022  22:12   DG Tibia/Fibula Right  Result Date: 05/20/2022 CLINICAL DATA:  Trauma, dog bite. EXAM: RIGHT TIBIA AND FIBULA - 2 VIEW COMPARISON:  None Available. FINDINGS: There is no evidence of fracture or other focal bone lesions. The cortical margins of the tibia and fibular intact. Soft tissue defect about the medial aspect of the lower leg. There is diffuse subcutaneous edema. No radiopaque foreign body. IMPRESSION: 1. Soft tissue defect about the medial lower leg. No radiopaque foreign body or acute osseous abnormality. 2. Diffuse subcutaneous edema. 3. No acute osseous abnormalities. Electronically Signed   By: Narda Rutherford M.D.   On: 05/20/2022 22:10   DG Pelvis Portable  Result Date: 05/20/2022 CLINICAL DATA:  Trauma, dog bite. EXAM: PORTABLE PELVIS 1-2 VIEWS COMPARISON:  None Available. FINDINGS: Intact bilateral hip arthroplasties. No periprosthetic lucency or fracture. Intact bony pelvis, no rami fractures. There is mild degenerative change of the pubic symphysis which remains congruent. No sacroiliac joint diastasis. Subcutaneous edema about both hips and lateral upper thighs. IMPRESSION: 1. Intact bilateral hip arthroplasties. No acute fracture. 2. Subcutaneous edema about the hips and lateral upper thighs. Electronically Signed   By: Narda Rutherford M.D.   On: 05/20/2022 22:09   DG Chest Port 1 View  Result Date: 05/20/2022 CLINICAL DATA:  Trauma, dog bite. EXAM: PORTABLE CHEST 1 VIEW COMPARISON:  None Available. FINDINGS: The heart is normal in size. There is aortic tortuosity. No pneumothorax or pleural effusion. No focal airspace disease. On limited assessment, no acute osseous findings. IMPRESSION: 1. No acute traumatic injury to the thorax. 2. Tortuous aorta. Electronically Signed   By: Narda Rutherford M.D.   On: 05/20/2022 22:09    Procedures Procedures    Medications Ordered in ED Medications  enoxaparin (LOVENOX) injection 30 mg (has no administration in time range)  0.9 %   sodium chloride infusion (has no administration in time range)  metoprolol tartrate (LOPRESSOR) injection 5 mg (has no administration in time range)  hydrALAZINE (APRESOLINE) injection 10 mg (has no administration in time range)  ondansetron (ZOFRAN-ODT) disintegrating tablet 4 mg (has no administration in time range)    Or  ondansetron (ZOFRAN) injection 4 mg (has no administration in time range)  docusate sodium (COLACE) capsule 100 mg (has  no administration in time range)  bisacodyl (DULCOLAX) suppository 10 mg (has no administration in time range)  acetaminophen (TYLENOL) tablet 1,000 mg (has no administration in time range)  HYDROmorphone (DILAUDID) injection 0.5-1 mg (has no administration in time range)  oxyCODONE (Oxy IR/ROXICODONE) immediate release tablet 10 mg (has no administration in time range)  oxyCODONE (Oxy IR/ROXICODONE) immediate release tablet 5 mg (has no administration in time range)  traMADol (ULTRAM) tablet 50 mg (has no administration in time range)  gabapentin (NEURONTIN) capsule 300 mg (has no administration in time range)  methocarbamol (ROBAXIN) 500 mg in dextrose 5 % 50 mL IVPB (has no administration in time range)  Ampicillin-Sulbactam (UNASYN) 3 g in sodium chloride 0.9 % 100 mL IVPB (has no administration in time range)  acetaminophen (OFIRMEV) 10 MG/ML IV (has no administration in time range)  piperacillin-tazobactam (ZOSYN) IVPB 3.375 g (0 g Intravenous Stopped 05/20/22 2145)  fentaNYL (SUBLIMAZE) injection 100 mcg (100 mcg Intravenous Given 05/20/22 2113)  sodium chloride 0.9 % bolus 1,000 mL (0 mLs Intravenous Stopped 05/20/22 2157)  Tdap (BOOSTRIX) injection 0.5 mL (0.5 mLs Intramuscular Given 05/20/22 2116)  morphine (PF) 4 MG/ML injection 4 mg (4 mg Intravenous Given 05/20/22 2154)  morphine (PF) 4 MG/ML injection 4 mg (4 mg Intravenous Given 05/20/22 2236)    ED Course/ Medical Decision Making/ A&P                             Medical Decision Making Amount  and/or Complexity of Data Reviewed Labs: ordered. Radiology: ordered.  Risk Prescription drug management. Decision regarding hospitalization.    Medical Screen Complete  This patient presented to the ED with complaint of dog bite.  This complaint involves an extensive number of treatment options. The initial differential diagnosis includes, but is not limited to, trauma related to dog bite  This presentation is: Acute, Self-Limited, Previously Undiagnosed, Uncertain Prognosis, Complicated, Systemic Symptoms, and Threat to Life/Bodily Function  Patient reports being bitten by her neighbors pitbull dog.  Patient with injuries to all 4 extremities.  Dog is reportedly up-to-date.  Law enforcement is involved.  Patient with significant soft tissue defect to the left upper arm crossing over the left elbow.  Multiple soft tissue injuries noted to other extremities as well.  Distal left ulna with fracture.  Patient's case discussed with both the trauma service, Dr. Glena Norfolk (hand), and Dr. Christell Constant (ortho).  Wound on the patient's left arm will require operative washout emergently.  Patient will require admission for further treatment.  Additional history obtained:  Additional history obtained from EMS External records from outside sources obtained and reviewed including prior ED visits and prior Inpatient records.    Lab Tests:  I ordered and personally interpreted labs.  The pertinent results include: CBC, CMP, lactic acid, UA, i-STAT Chem-8, type and screen   Imaging Studies ordered:  I ordered imaging studies including plain films of chest, pelvis, right tib-fib, left knee, right forearm, left forearm, left humerus, left wrist I independently visualized and interpreted obtained imaging which showed multiple soft tissue defects, left distal ulna fracture I agree with the radiologist interpretation.   Cardiac Monitoring:  The patient was maintained on a cardiac  monitor.  I personally viewed and interpreted the cardiac monitor which showed an underlying rhythm of: NSR   Medicines ordered:  I ordered medication including tetanus prophylaxis, antibiotics, narcotics for dog bite Reevaluation of the patient after these medicines showed that the  patient: improved Problem List / ED Course:  Dog bite   Reevaluation:  After the interventions noted above, I reevaluated the patient and found that they have: improved   Disposition:  After consideration of the diagnostic results and the patients response to treatment, I feel that the patent would benefit from admission.   CRITICAL CARE Performed by: Wynetta Fines   Total critical care time: 30 minutes  Critical care time was exclusive of separately billable procedures and treating other patients.  Critical care was necessary to treat or prevent imminent or life-threatening deterioration.  Critical care was time spent personally by me on the following activities: development of treatment plan with patient and/or surrogate as well as nursing, discussions with consultants, evaluation of patient's response to treatment, examination of patient, obtaining history from patient or surrogate, ordering and performing treatments and interventions, ordering and review of laboratory studies, ordering and review of radiographic studies, pulse oximetry and re-evaluation of patient's condition.          Final Clinical Impression(s) / ED Diagnoses Final diagnoses:  Dog bite, initial encounter    Rx / DC Orders ED Discharge Orders     None         Wynetta Fines, MD 05/21/22 0030

## 2022-05-21 NOTE — Progress Notes (Signed)
Patient ID: April Bowman, female   DOB: 04-11-1955, 67 y.o.   MRN: [id number]   LOS: 0 days   Subjective: Pain controlled, doing well   Objective: Vital signs in last 24 hours: Temp:  [97.8 F (36.6 C)-98.7 F (37.1 C)] 98.3 F (36.8 C) (05/08 0704) Pulse Rate:  [74-88] 74 (05/08 0704) Resp:  [10-21] 16 (05/08 0704) BP: (120-173)/(70-119) 156/76 (05/08 0704) SpO2:  [85 %-99 %] 99 % (05/08 0704) Weight:  [59 kg] 59 kg (05/07 2138)     Laboratory  CBC Recent Labs    05/20/22 2101 05/20/22 2110 05/21/22 0619  WBC 14.8*  --  12.5*  HGB 14.0 14.3 11.8*  HCT 41.3 42.0 36.1  PLT 352  --  285   BMET Recent Labs    05/20/22 2101 05/20/22 2110 05/21/22 0619  NA 135 135 131*  K 4.1 4.1 4.7  CL 100 103 100  CO2 18*  --  20*  GLUCOSE 135* 132* 151*  BUN 16 17 12   CREATININE 0.82 0.70 0.77  CALCIUM 9.0  --  8.2*     Physical Exam General appearance: alert and no distress LUE: Long arm splint in place. A/R/M/U intact. Motor grossly intact. Fingers perfused.   Assessment/Plan: Dog bites BUE -- Will have plastics consult to see if they can provide soft tissue coverage.    Freeman Caldron, PA-C Orthopedic Surgery [phone] 05/21/2022

## 2022-05-21 NOTE — Anesthesia Postprocedure Evaluation (Signed)
Anesthesia Post Note  Patient: [name] Performed: IRRIGATION AND DEBRIDEMENT AND WOUND VAC PLACEMENT LEFT ARM (Left: Arm Lower) IRRIGATION AND DEBRIDEMENT OF WOUND RIGHT ARM (Right: Arm Lower)     Patient location during evaluation: PACU Anesthesia Type: General Level of consciousness: awake and alert Pain management: pain level controlled Vital Signs Assessment: post-procedure vital signs reviewed and stable Respiratory status: spontaneous breathing, nonlabored ventilation, respiratory function stable and patient connected to nasal cannula oxygen Cardiovascular status: blood pressure returned to baseline and stable Postop Assessment: no apparent nausea or vomiting Anesthetic complications: no   No notable events documented.  Last Vitals:  Vitals:   05/21/22 0300 05/21/22 0326  BP: (!) 153/72 (!) 158/82  Pulse:  82  Resp: 10   Temp: 36.6 C 37.1 C  SpO2: 97% 99%    Last Pain:  Vitals:   05/21/22 0326  TempSrc: Oral  PainSc:                  Nelle Don Fran Mcree

## 2022-05-21 NOTE — Progress Notes (Signed)
Progress Note  1 Day Post-Op  Subjective: Pt reports pain in LUE but overall improved with medications. She is having a lot of anxiety and waking up with stomach cramping and fast HR.   Objective: Vital signs in last 24 hours: Temp:  [97.8 F (36.6 C)-98.7 F (37.1 C)] 98.3 F (36.8 C) (05/08 0704) Pulse Rate:  [74-88] 74 (05/08 0704) Resp:  [10-21] 16 (05/08 0704) BP: (120-173)/(70-119) 156/76 (05/08 0704) SpO2:  [85 %-99 %] 99 % (05/08 0704) Weight:  [59 kg] 59 kg (05/07 2138)    Intake/Output from previous day: 05/07 0701 - 05/08 0700 In: 2350 [I.V.:1200; IV Piggyback:1150] Out: 5 [Blood:5] Intake/Output this shift: No intake/output data recorded.  PE: General: pleasant, WD, WN female who is laying in bed in NAD Heart: regular, rate, and rhythm.  Normal s1,s2. No obvious murmurs, gallops, or rubs noted.  Palpable pedal pulses bilaterally Lungs: CTAB, no wheezes, rhonchi, or rales noted.  Respiratory effort nonlabored Abd: soft, NT, ND, +BS, no masses, hernias, or organomegaly MS: splint and sling to LUE, L fingers NVI; avulsion wound to RUE, avulsion wounds to BLE Psych: A&Ox3 with an appropriate affect.    Lab Results:  Recent Labs    05/20/22 2101 05/20/22 2110 05/21/22 0619  WBC 14.8*  --  12.5*  HGB 14.0 14.3 11.8*  HCT 41.3 42.0 36.1  PLT 352  --  285   BMET Recent Labs    05/20/22 2101 05/20/22 2110 05/21/22 0619  NA 135 135 131*  K 4.1 4.1 4.7  CL 100 103 100  CO2 18*  --  20*  GLUCOSE 135* 132* 151*  BUN 16 17 12   CREATININE 0.82 0.70 0.77  CALCIUM 9.0  --  8.2*   PT/INR Recent Labs    05/20/22 2101  LABPROT 12.5  INR 0.9   CMP     Component Value Date/Time   NA 131 (L) 05/21/2022 0619   K 4.7 05/21/2022 0619   CL 100 05/21/2022 0619   CO2 20 (L) 05/21/2022 0619   GLUCOSE 151 (H) 05/21/2022 0619   BUN 12 05/21/2022 0619   CREATININE 0.77 05/21/2022 0619   CALCIUM 8.2 (L) 05/21/2022 0619   PROT 6.6 05/20/2022 2101   ALBUMIN  3.6 05/20/2022 2101   AST 29 05/20/2022 2101   ALT 19 05/20/2022 2101   ALKPHOS 87 05/20/2022 2101   BILITOT 0.4 05/20/2022 2101   GFRNONAA >60 05/21/2022 0619   Lipase     Component Value Date/Time   LIPASE 26 09/03/2021 0754       Studies/Results: CT ELBOW LEFT WO CONTRAST  Result Date: 05/21/2022 CLINICAL DATA:  Joint effusion. Evaluate elbow for intra-articular gas. Dog bite. EXAM: CT OF THE UPPER LEFT EXTREMITY WITHOUT CONTRAST TECHNIQUE: Multidetector CT imaging of the upper left extremity was performed according to the standard protocol. RADIATION DOSE REDUCTION: This exam was performed according to the departmental dose-optimization program which includes automated exposure control, adjustment of the mA and/or kV according to patient size and/or use of iterative reconstruction technique. COMPARISON:  Radiographs 05/20/2022 FINDINGS: Bones/Joint/Cartilage No acute fracture or dislocation. There is mild loss of definition of the anterior cortex of the distal humeral diaphysis in the area of large soft tissue defect in the distal upper forearm reaching the anterior surface of the humerus (series 8/image 57). Soft tissue edema and gas also extend to the posteromedial humerus epicondyle without definite cortical irregularity. No intra-articular gas or definite elbow joint effusion. Ligaments Suboptimally assessed by  CT. Muscles and Tendons Poorly assessed via CT. There is intramuscular gas within the anterior and posterior compartments of the upper arm and visualized proximal forearm. Soft tissues Extensive soft tissue lacerations about the anterior and posterior arm in the region of the elbow. The lacerations extend to bone at the medial humeral epicondyle and about the anterior distal humeral diaphysis. IMPRESSION: 1. Extensive soft tissue lacerations about the anterior and posterior arm in the region of the elbow. The lacerations extend to bone at the medial humeral epicondyle and about the  anterior distal humeral diaphysis. There is mild loss of definition of the anterior cortex of the distal humeral diaphysis in the area of large soft tissue defect without definite fracture. 2. Intramuscular and subcutaneous gas within the anterior and posterior compartments of the upper arm and visualized proximal forearm. No intra-articular gas or definite elbow joint effusion. Electronically Signed   By: Minerva Fester M.D.   On: 05/21/2022 00:14   DG Wrist Complete Left  Result Date: 05/20/2022 CLINICAL DATA:  Dog bite to the left wrist. EXAM: LEFT WRIST - COMPLETE 3+ VIEW COMPARISON:  Left upper extremity radiograph dated 05/20/2022. FINDINGS: Comminuted and displaced fracture of the distal ulna as seen on the earlier radiograph. No other acute fracture. There is no dislocation. The bones are osteopenic. There is laceration of the skin and superficial soft tissues of the forearm. IMPRESSION: Comminuted and displaced fracture of the distal ulna. Electronically Signed   By: Elgie Collard M.D.   On: 05/20/2022 22:31   DG Humerus Left  Result Date: 05/20/2022 CLINICAL DATA:  Trauma, dog bite. EXAM: LEFT HUMERUS - 2+ VIEW COMPARISON:  None Available. FINDINGS: There is no evidence of fracture or other focal bone lesions. Large soft tissue defect about the distal lateral aspect of the upper arm towards the elbow. No radiopaque foreign body. Tourniquet overlies the mid arm. IMPRESSION: 1. Large soft tissue defect about the distal lateral upper arm. No radiopaque foreign body or acute fracture. 2. Tourniquet overlies the mid arm. Electronically Signed   By: Narda Rutherford M.D.   On: 05/20/2022 22:16   DG Forearm Left  Result Date: 05/20/2022 CLINICAL DATA:  Trauma, dog bite. EXAM: LEFT FOREARM - 2 VIEW COMPARISON:  None Available. FINDINGS: Displaced fracture of the distal ulna. 1/2 shaft with radial displacement of distal fracture fragment. Deformity of the distal radius has the appearance of a remote  injury. Elbow alignment is maintained. There is diffuse subcutaneous edema and multifocal soft tissue irregularity. There is a punctate round density within the ulnar soft tissues at is likely a soft tissue phleboliths, although small foreign body is not entirely excluded. IMPRESSION: 1. Displaced distal ulna fracture. Findings suggestive of remote distal radius fracture. 2. Punctate round density in the ulnar soft tissues is likely a soft tissue phlebolith, although small foreign body is not entirely excluded. 3. Diffuse subcutaneous edema and multifocal soft tissue irregularity. Electronically Signed   By: Narda Rutherford M.D.   On: 05/20/2022 22:15   DG Forearm Right  Result Date: 05/20/2022 CLINICAL DATA:  Trauma, dog bite. EXAM: RIGHT FOREARM - 2 VIEW COMPARISON:  None Available. FINDINGS: There is no evidence of fracture or other focal bone lesions. Wrist and elbow alignment are maintained. There is soft tissue edema at multiple sites. No radiopaque foreign body. Antecubital IVs. IMPRESSION: Soft tissue edema without radiopaque foreign body or acute fracture. Electronically Signed   By: Narda Rutherford M.D.   On: 05/20/2022 22:13   DG  Knee 2 Views Left  Result Date: 05/20/2022 CLINICAL DATA:  Trauma, dog bite. EXAM: LEFT KNEE - 1-2 VIEW COMPARISON:  None Available. FINDINGS: No fracture or dislocation. Moderate degenerative change with tricompartmental peripheral spurring and medial tibiofemoral joint space narrowing. Suspect sequela of remote injury in the proximal tibia. Tibial ghost track in the metaphysis. No significant joint effusion. Subcutaneous edema medial and laterally. No soft tissue foreign body. IMPRESSION: 1. No acute fracture or dislocation. 2. Moderate knee degenerative change and probable sequela of remote injury in the proximal tibia. 3. Subcutaneous edema. Electronically Signed   By: Narda Rutherford M.D.   On: 05/20/2022 22:12   DG Tibia/Fibula Right  Result Date:  05/20/2022 CLINICAL DATA:  Trauma, dog bite. EXAM: RIGHT TIBIA AND FIBULA - 2 VIEW COMPARISON:  None Available. FINDINGS: There is no evidence of fracture or other focal bone lesions. The cortical margins of the tibia and fibular intact. Soft tissue defect about the medial aspect of the lower leg. There is diffuse subcutaneous edema. No radiopaque foreign body. IMPRESSION: 1. Soft tissue defect about the medial lower leg. No radiopaque foreign body or acute osseous abnormality. 2. Diffuse subcutaneous edema. 3. No acute osseous abnormalities. Electronically Signed   By: Narda Rutherford M.D.   On: 05/20/2022 22:10   DG Pelvis Portable  Result Date: 05/20/2022 CLINICAL DATA:  Trauma, dog bite. EXAM: PORTABLE PELVIS 1-2 VIEWS COMPARISON:  None Available. FINDINGS: Intact bilateral hip arthroplasties. No periprosthetic lucency or fracture. Intact bony pelvis, no rami fractures. There is mild degenerative change of the pubic symphysis which remains congruent. No sacroiliac joint diastasis. Subcutaneous edema about both hips and lateral upper thighs. IMPRESSION: 1. Intact bilateral hip arthroplasties. No acute fracture. 2. Subcutaneous edema about the hips and lateral upper thighs. Electronically Signed   By: Narda Rutherford M.D.   On: 05/20/2022 22:09   DG Chest Port 1 View  Result Date: 05/20/2022 CLINICAL DATA:  Trauma, dog bite. EXAM: PORTABLE CHEST 1 VIEW COMPARISON:  None Available. FINDINGS: The heart is normal in size. There is aortic tortuosity. No pneumothorax or pleural effusion. No focal airspace disease. On limited assessment, no acute osseous findings. IMPRESSION: 1. No acute traumatic injury to the thorax. 2. Tortuous aorta. Electronically Signed   By: Narda Rutherford M.D.   On: 05/20/2022 22:09    Anti-infectives: Anti-infectives (From admission, onward)    Start     Dose/Rate Route Frequency Ordered Stop   05/21/22 0600  Ampicillin-Sulbactam (UNASYN) 3 g in sodium chloride 0.9 % 100 mL IVPB         3 g 200 mL/hr over 30 Minutes Intravenous Every 6 hours 05/20/22 2254     05/20/22 2115  piperacillin-tazobactam (ZOSYN) IVPB 3.375 g        3.375 g 100 mL/hr over 30 Minutes Intravenous  Once 05/20/22 2110 05/20/22 2145        Assessment/Plan  Dog attack  L Ulnar fracture - per ortho, s/p I&D early this AM, splinted, awaiting definitive plan LUE degloving injury RUE avulsion injury  BLE avulsion injuries Plastics consult for soft tissue injuries pending  Acute stress reaction - consult psych, hydroxyzine TID prn   Need to clarify if dog was UTD on vaccines.   FEN: Reg diet, decrease IVF to 50cc/h VTE: LMWH ID: unasyy 5/8>>   LOS: 0 days   I reviewed ED provider notes, Consultant ortho notes, last 24 h vitals and pain scores, last 48 h intake and output, last 24 h labs  and trends, and last 24 h imaging results.   Juliet Rude, [facility] Surgery 05/21/2022, 10:58 AM Please see Amion for pager number during day hours 7:00am-4:30pm

## 2022-05-22 DIAGNOSIS — S81851A Open bite, right lower leg, initial encounter: Secondary | ICD-10-CM | POA: Diagnosis not present

## 2022-05-22 DIAGNOSIS — W540XXA Bitten by dog, initial encounter: Secondary | ICD-10-CM

## 2022-05-22 DIAGNOSIS — S81852A Open bite, left lower leg, initial encounter: Secondary | ICD-10-CM | POA: Diagnosis not present

## 2022-05-22 DIAGNOSIS — S51852A Open bite of left forearm, initial encounter: Secondary | ICD-10-CM | POA: Diagnosis not present

## 2022-05-22 DIAGNOSIS — S51851A Open bite of right forearm, initial encounter: Secondary | ICD-10-CM | POA: Diagnosis not present

## 2022-05-22 LAB — CBC
HCT: 27.7 % — ABNORMAL LOW (ref 36.0–46.0)
HCT: 32.6 % — ABNORMAL LOW (ref 36.0–46.0)
Hemoglobin: 9.1 g/dL — ABNORMAL LOW (ref 12.0–15.0)
Hemoglobin: 10.4 g/dL — ABNORMAL LOW (ref 12.0–15.0)
MCH: 30.7 pg (ref 26.0–34.0)
MCH: 30.7 pg (ref 26.0–34.0)
MCHC: 32.9 g/dL (ref 30.0–36.0)
MCHC: 31.9 g/dL (ref 30.0–36.0)
MCV: 93.6 fL (ref 80.0–100.0)
MCV: 96.2 fL (ref 80.0–100.0)
Platelets: 218 10*3/uL (ref 150–400)
Platelets: 266 10*3/uL (ref 150–400)
RBC: 2.96 MIL/uL — ABNORMAL LOW (ref 3.87–5.11)
RBC: 3.39 MIL/uL — ABNORMAL LOW (ref 3.87–5.11)
RDW: 13.5 % (ref 11.5–15.5)
RDW: 13.9 % (ref 11.5–15.5)
WBC: 8.3 10*3/uL (ref 4.0–10.5)
WBC: 10.7 10*3/uL — ABNORMAL HIGH (ref 4.0–10.5)
nRBC: 0 % (ref 0.0–0.2)
nRBC: 0 % (ref 0.0–0.2)

## 2022-05-22 LAB — BASIC METABOLIC PANEL
Anion gap: 6 (ref 5–15)
BUN: 13 mg/dL (ref 8–23)
CO2: 25 mmol/L (ref 22–32)
Calcium: 7.7 mg/dL — ABNORMAL LOW (ref 8.9–10.3)
Chloride: 104 mmol/L (ref 98–111)
Creatinine, Ser: 0.91 mg/dL (ref 0.44–1.00)
GFR, Estimated: >60 mL/min (ref >60)
Glucose, Bld: 115 mg/dL — ABNORMAL HIGH (ref 70–99)
Potassium: 4.1 mmol/L (ref 3.5–5.1)
Sodium: 135 mmol/L (ref 135–145)

## 2022-05-22 MED ORDER — METHOCARBAMOL 500 MG PO TABS
1000.0000 mg | ORAL_TABLET | Freq: Three times a day (TID) | ORAL | Status: DC
  Administered 2022-05-22 – 2022-05-28 (×20): 1000 mg via ORAL
  Filled 2022-05-22 (×20): qty 2

## 2022-05-22 NOTE — Progress Notes (Signed)
2 Days Post-Op  Subjective: Patient is a 67 year old female who presented to the [facility], ER yesterday after being attacked by a dog.  She sustained several dog bites to her extremities, including a fairly large defect to her left upper extremity.  She was taken to the OR yesterday with orthopedics for irrigation and debridement of soft tissue defects of the left arm and distal ulna and irrigation and debridement of right forearm wounds.  Wound VAC was placed over the left upper extremity wound.  Plastic surgery was consulted and management of the wound as patient may require soft tissue coverage including a possible flap.  Upon entering the room today patient is sitting upright in no acute distress in her chair.  She reports she is doing okay.  Objective: Vital signs in last 24 hours: Temp:  [97.3 F (36.3 C)-98 F (36.7 C)] 97.3 F (36.3 C) (05/09 0823) Pulse Rate:  [66-79] 66 (05/09 0823) Resp:  [16-18] 18 (05/09 0823) BP: (95-138)/(44-64) 138/64 (05/09 0823) SpO2:  [98 %-100 %] 98 % (05/09 0823) Last BM Date : 05/20/22  Intake/Output from previous day: No intake/output data recorded. Intake/Output this shift: No intake/output data recorded.  General appearance: alert, cooperative, and no distress Resp: Unlabored breathing, no respiratory distress Extremities: Wound VAC in place and functioning to left upper extremity.  Dressings appear CDI.  Sling in place.  Lab Results:     Latest Ref Rng & Units 05/22/2022    1:35 AM 05/21/2022    6:19 AM 05/20/2022    9:10 PM  CBC  WBC 4.0 - 10.5 K/uL 8.3  12.5    Hemoglobin 12.0 - 15.0 g/dL 9.1  40.9  81.1   Hematocrit 36.0 - 46.0 % 27.7  36.1  42.0   Platelets 150 - 400 K/uL 218  285      BMET Recent Labs    05/21/22 0619 05/22/22 0135  NA 131* 135  K 4.7 4.1  CL 100 104  CO2 20* 25  GLUCOSE 151* 115*  BUN 12 13  CREATININE 0.77 0.91  CALCIUM 8.2* 7.7*   PT/INR Recent Labs    05/20/22 2101  LABPROT 12.5  INR 0.9    ABG No results for input(s): "PHART", "HCO3" in the last 72 hours.  Invalid input(s): "PCO2", "PO2"  Studies/Results: CT ELBOW LEFT WO CONTRAST  Result Date: 05/21/2022 CLINICAL DATA:  Joint effusion. Evaluate elbow for intra-articular gas. Dog bite. EXAM: CT OF THE UPPER LEFT EXTREMITY WITHOUT CONTRAST TECHNIQUE: Multidetector CT imaging of the upper left extremity was performed according to the standard protocol. RADIATION DOSE REDUCTION: This exam was performed according to the departmental dose-optimization program which includes automated exposure control, adjustment of the mA and/or kV according to patient size and/or use of iterative reconstruction technique. COMPARISON:  Radiographs 05/20/2022 FINDINGS: Bones/Joint/Cartilage No acute fracture or dislocation. There is mild loss of definition of the anterior cortex of the distal humeral diaphysis in the area of large soft tissue defect in the distal upper forearm reaching the anterior surface of the humerus (series 8/image 57). Soft tissue edema and gas also extend to the posteromedial humerus epicondyle without definite cortical irregularity. No intra-articular gas or definite elbow joint effusion. Ligaments Suboptimally assessed by CT. Muscles and Tendons Poorly assessed via CT. There is intramuscular gas within the anterior and posterior compartments of the upper arm and visualized proximal forearm. Soft tissues Extensive soft tissue lacerations about the anterior and posterior arm in the region of the elbow. The lacerations  extend to bone at the medial humeral epicondyle and about the anterior distal humeral diaphysis. IMPRESSION: 1. Extensive soft tissue lacerations about the anterior and posterior arm in the region of the elbow. The lacerations extend to bone at the medial humeral epicondyle and about the anterior distal humeral diaphysis. There is mild loss of definition of the anterior cortex of the distal humeral diaphysis in the area of  large soft tissue defect without definite fracture. 2. Intramuscular and subcutaneous gas within the anterior and posterior compartments of the upper arm and visualized proximal forearm. No intra-articular gas or definite elbow joint effusion. Electronically Signed   By: Minerva Fester M.D.   On: 05/21/2022 00:14   DG Wrist Complete Left  Result Date: 05/20/2022 CLINICAL DATA:  Dog bite to the left wrist. EXAM: LEFT WRIST - COMPLETE 3+ VIEW COMPARISON:  Left upper extremity radiograph dated 05/20/2022. FINDINGS: Comminuted and displaced fracture of the distal ulna as seen on the earlier radiograph. No other acute fracture. There is no dislocation. The bones are osteopenic. There is laceration of the skin and superficial soft tissues of the forearm. IMPRESSION: Comminuted and displaced fracture of the distal ulna. Electronically Signed   By: Elgie Collard M.D.   On: 05/20/2022 22:31   DG Humerus Left  Result Date: 05/20/2022 CLINICAL DATA:  Trauma, dog bite. EXAM: LEFT HUMERUS - 2+ VIEW COMPARISON:  None Available. FINDINGS: There is no evidence of fracture or other focal bone lesions. Large soft tissue defect about the distal lateral aspect of the upper arm towards the elbow. No radiopaque foreign body. Tourniquet overlies the mid arm. IMPRESSION: 1. Large soft tissue defect about the distal lateral upper arm. No radiopaque foreign body or acute fracture. 2. Tourniquet overlies the mid arm. Electronically Signed   By: Narda Rutherford M.D.   On: 05/20/2022 22:16   DG Forearm Left  Result Date: 05/20/2022 CLINICAL DATA:  Trauma, dog bite. EXAM: LEFT FOREARM - 2 VIEW COMPARISON:  None Available. FINDINGS: Displaced fracture of the distal ulna. 1/2 shaft with radial displacement of distal fracture fragment. Deformity of the distal radius has the appearance of a remote injury. Elbow alignment is maintained. There is diffuse subcutaneous edema and multifocal soft tissue irregularity. There is a punctate  round density within the ulnar soft tissues at is likely a soft tissue phleboliths, although small foreign body is not entirely excluded. IMPRESSION: 1. Displaced distal ulna fracture. Findings suggestive of remote distal radius fracture. 2. Punctate round density in the ulnar soft tissues is likely a soft tissue phlebolith, although small foreign body is not entirely excluded. 3. Diffuse subcutaneous edema and multifocal soft tissue irregularity. Electronically Signed   By: Narda Rutherford M.D.   On: 05/20/2022 22:15   DG Forearm Right  Result Date: 05/20/2022 CLINICAL DATA:  Trauma, dog bite. EXAM: RIGHT FOREARM - 2 VIEW COMPARISON:  None Available. FINDINGS: There is no evidence of fracture or other focal bone lesions. Wrist and elbow alignment are maintained. There is soft tissue edema at multiple sites. No radiopaque foreign body. Antecubital IVs. IMPRESSION: Soft tissue edema without radiopaque foreign body or acute fracture. Electronically Signed   By: Narda Rutherford M.D.   On: 05/20/2022 22:13   DG Knee 2 Views Left  Result Date: 05/20/2022 CLINICAL DATA:  Trauma, dog bite. EXAM: LEFT KNEE - 1-2 VIEW COMPARISON:  None Available. FINDINGS: No fracture or dislocation. Moderate degenerative change with tricompartmental peripheral spurring and medial tibiofemoral joint space narrowing. Suspect sequela of remote  injury in the proximal tibia. Tibial ghost track in the metaphysis. No significant joint effusion. Subcutaneous edema medial and laterally. No soft tissue foreign body. IMPRESSION: 1. No acute fracture or dislocation. 2. Moderate knee degenerative change and probable sequela of remote injury in the proximal tibia. 3. Subcutaneous edema. Electronically Signed   By: Narda Rutherford M.D.   On: 05/20/2022 22:12   DG Tibia/Fibula Right  Result Date: 05/20/2022 CLINICAL DATA:  Trauma, dog bite. EXAM: RIGHT TIBIA AND FIBULA - 2 VIEW COMPARISON:  None Available. FINDINGS: There is no evidence of  fracture or other focal bone lesions. The cortical margins of the tibia and fibular intact. Soft tissue defect about the medial aspect of the lower leg. There is diffuse subcutaneous edema. No radiopaque foreign body. IMPRESSION: 1. Soft tissue defect about the medial lower leg. No radiopaque foreign body or acute osseous abnormality. 2. Diffuse subcutaneous edema. 3. No acute osseous abnormalities. Electronically Signed   By: Narda Rutherford M.D.   On: 05/20/2022 22:10   DG Pelvis Portable  Result Date: 05/20/2022 CLINICAL DATA:  Trauma, dog bite. EXAM: PORTABLE PELVIS 1-2 VIEWS COMPARISON:  None Available. FINDINGS: Intact bilateral hip arthroplasties. No periprosthetic lucency or fracture. Intact bony pelvis, no rami fractures. There is mild degenerative change of the pubic symphysis which remains congruent. No sacroiliac joint diastasis. Subcutaneous edema about both hips and lateral upper thighs. IMPRESSION: 1. Intact bilateral hip arthroplasties. No acute fracture. 2. Subcutaneous edema about the hips and lateral upper thighs. Electronically Signed   By: Narda Rutherford M.D.   On: 05/20/2022 22:09   DG Chest Port 1 View  Result Date: 05/20/2022 CLINICAL DATA:  Trauma, dog bite. EXAM: PORTABLE CHEST 1 VIEW COMPARISON:  None Available. FINDINGS: The heart is normal in size. There is aortic tortuosity. No pneumothorax or pleural effusion. No focal airspace disease. On limited assessment, no acute osseous findings. IMPRESSION: 1. No acute traumatic injury to the thorax. 2. Tortuous aorta. Electronically Signed   By: Narda Rutherford M.D.   On: 05/20/2022 22:09    Anti-infectives: Anti-infectives (From admission, onward)    Start     Dose/Rate Route Frequency Ordered Stop   05/21/22 0600  Ampicillin-Sulbactam (UNASYN) 3 g in sodium chloride 0.9 % 100 mL IVPB        3 g 200 mL/hr over 30 Minutes Intravenous Every 6 hours 05/20/22 2254     05/20/22 2115  piperacillin-tazobactam (ZOSYN) IVPB 3.375 g         3.375 g 100 mL/hr over 30 Minutes Intravenous  Once 05/20/22 2110 05/20/22 2145       Assessment/Plan: s/p Procedure(s): IRRIGATION AND DEBRIDEMENT AND WOUND VAC PLACEMENT LEFT ARM IRRIGATION AND DEBRIDEMENT OF WOUND RIGHT ARM Plan: -Case was discussed with Dr. Ulice Bold and she will plan to take the patient to the OR next week for placement of myriad and wound VAC. -I discussed the plan for surgery with the patient and she was in agreement with the plan. -Will continue to follow. -Medical management and pain control per primary  LOS: 1 day    Laurena Spies, PA-C 05/22/2022

## 2022-05-22 NOTE — Plan of Care (Signed)

## 2022-05-22 NOTE — Progress Notes (Signed)
Progress Note  2 Days Post-Op  Subjective: Pt reports pain in LUE but overall well controlled. Denies abdominal pain or n/v.   Objective: Vital signs in last 24 hours: Temp:  [97.3 F (36.3 C)-98.2 F (36.8 C)] 97.3 F (36.3 C) (05/09 0823) Pulse Rate:  [66-79] 66 (05/09 0823) Resp:  [16-18] 18 (05/09 0823) BP: (95-138)/(44-64) 138/64 (05/09 0823) SpO2:  [98 %-100 %] 98 % (05/09 0823)    Intake/Output from previous day: No intake/output data recorded. Intake/Output this shift: No intake/output data recorded.  PE: General: pleasant, WD, WN female who is laying in bed in NAD Heart: regular, rate, and rhythm.  Normal s1,s2. No obvious murmurs, gallops, or rubs noted.  Palpable pedal pulses bilaterally Lungs: CTAB, no wheezes, rhonchi, or rales noted.  Respiratory effort nonlabored Abd: soft, NT, ND, +BS, no masses, hernias, or organomegaly MS: splint and sling to LUE, L fingers NVI; avulsion wound to RUE, avulsion wounds to BLE Psych: A&Ox3 with an appropriate affect.    Lab Results:  Recent Labs    05/21/22 0619 05/22/22 0135  WBC 12.5* 8.3  HGB 11.8* 9.1*  HCT 36.1 27.7*  PLT 285 218    BMET Recent Labs    05/21/22 0619 05/22/22 0135  NA 131* 135  K 4.7 4.1  CL 100 104  CO2 20* 25  GLUCOSE 151* 115*  BUN 12 13  CREATININE 0.77 0.91  CALCIUM 8.2* 7.7*    PT/INR Recent Labs    05/20/22 2101  LABPROT 12.5  INR 0.9    CMP     Component Value Date/Time   NA 135 05/22/2022 0135   K 4.1 05/22/2022 0135   CL 104 05/22/2022 0135   CO2 25 05/22/2022 0135   GLUCOSE 115 (H) 05/22/2022 0135   BUN 13 05/22/2022 0135   CREATININE 0.91 05/22/2022 0135   CALCIUM 7.7 (L) 05/22/2022 0135   PROT 6.6 05/20/2022 2101   ALBUMIN 3.6 05/20/2022 2101   AST 29 05/20/2022 2101   ALT 19 05/20/2022 2101   ALKPHOS 87 05/20/2022 2101   BILITOT 0.4 05/20/2022 2101   GFRNONAA >60 05/22/2022 0135   Lipase     Component Value Date/Time   LIPASE 26 09/03/2021  0754       Studies/Results: CT ELBOW LEFT WO CONTRAST  Result Date: 05/21/2022 CLINICAL DATA:  Joint effusion. Evaluate elbow for intra-articular gas. Dog bite. EXAM: CT OF THE UPPER LEFT EXTREMITY WITHOUT CONTRAST TECHNIQUE: Multidetector CT imaging of the upper left extremity was performed according to the standard protocol. RADIATION DOSE REDUCTION: This exam was performed according to the departmental dose-optimization program which includes automated exposure control, adjustment of the mA and/or kV according to patient size and/or use of iterative reconstruction technique. COMPARISON:  Radiographs 05/20/2022 FINDINGS: Bones/Joint/Cartilage No acute fracture or dislocation. There is mild loss of definition of the anterior cortex of the distal humeral diaphysis in the area of large soft tissue defect in the distal upper forearm reaching the anterior surface of the humerus (series 8/image 57). Soft tissue edema and gas also extend to the posteromedial humerus epicondyle without definite cortical irregularity. No intra-articular gas or definite elbow joint effusion. Ligaments Suboptimally assessed by CT. Muscles and Tendons Poorly assessed via CT. There is intramuscular gas within the anterior and posterior compartments of the upper arm and visualized proximal forearm. Soft tissues Extensive soft tissue lacerations about the anterior and posterior arm in the region of the elbow. The lacerations extend to bone at the medial  humeral epicondyle and about the anterior distal humeral diaphysis. IMPRESSION: 1. Extensive soft tissue lacerations about the anterior and posterior arm in the region of the elbow. The lacerations extend to bone at the medial humeral epicondyle and about the anterior distal humeral diaphysis. There is mild loss of definition of the anterior cortex of the distal humeral diaphysis in the area of large soft tissue defect without definite fracture. 2. Intramuscular and subcutaneous gas  within the anterior and posterior compartments of the upper arm and visualized proximal forearm. No intra-articular gas or definite elbow joint effusion. Electronically Signed   By: Minerva Fester M.D.   On: 05/21/2022 00:14   DG Wrist Complete Left  Result Date: 05/20/2022 CLINICAL DATA:  Dog bite to the left wrist. EXAM: LEFT WRIST - COMPLETE 3+ VIEW COMPARISON:  Left upper extremity radiograph dated 05/20/2022. FINDINGS: Comminuted and displaced fracture of the distal ulna as seen on the earlier radiograph. No other acute fracture. There is no dislocation. The bones are osteopenic. There is laceration of the skin and superficial soft tissues of the forearm. IMPRESSION: Comminuted and displaced fracture of the distal ulna. Electronically Signed   By: Elgie Collard M.D.   On: 05/20/2022 22:31   DG Humerus Left  Result Date: 05/20/2022 CLINICAL DATA:  Trauma, dog bite. EXAM: LEFT HUMERUS - 2+ VIEW COMPARISON:  None Available. FINDINGS: There is no evidence of fracture or other focal bone lesions. Large soft tissue defect about the distal lateral aspect of the upper arm towards the elbow. No radiopaque foreign body. Tourniquet overlies the mid arm. IMPRESSION: 1. Large soft tissue defect about the distal lateral upper arm. No radiopaque foreign body or acute fracture. 2. Tourniquet overlies the mid arm. Electronically Signed   By: Narda Rutherford M.D.   On: 05/20/2022 22:16   DG Forearm Left  Result Date: 05/20/2022 CLINICAL DATA:  Trauma, dog bite. EXAM: LEFT FOREARM - 2 VIEW COMPARISON:  None Available. FINDINGS: Displaced fracture of the distal ulna. 1/2 shaft with radial displacement of distal fracture fragment. Deformity of the distal radius has the appearance of a remote injury. Elbow alignment is maintained. There is diffuse subcutaneous edema and multifocal soft tissue irregularity. There is a punctate round density within the ulnar soft tissues at is likely a soft tissue phleboliths, although  small foreign body is not entirely excluded. IMPRESSION: 1. Displaced distal ulna fracture. Findings suggestive of remote distal radius fracture. 2. Punctate round density in the ulnar soft tissues is likely a soft tissue phlebolith, although small foreign body is not entirely excluded. 3. Diffuse subcutaneous edema and multifocal soft tissue irregularity. Electronically Signed   By: Narda Rutherford M.D.   On: 05/20/2022 22:15   DG Forearm Right  Result Date: 05/20/2022 CLINICAL DATA:  Trauma, dog bite. EXAM: RIGHT FOREARM - 2 VIEW COMPARISON:  None Available. FINDINGS: There is no evidence of fracture or other focal bone lesions. Wrist and elbow alignment are maintained. There is soft tissue edema at multiple sites. No radiopaque foreign body. Antecubital IVs. IMPRESSION: Soft tissue edema without radiopaque foreign body or acute fracture. Electronically Signed   By: Narda Rutherford M.D.   On: 05/20/2022 22:13   DG Knee 2 Views Left  Result Date: 05/20/2022 CLINICAL DATA:  Trauma, dog bite. EXAM: LEFT KNEE - 1-2 VIEW COMPARISON:  None Available. FINDINGS: No fracture or dislocation. Moderate degenerative change with tricompartmental peripheral spurring and medial tibiofemoral joint space narrowing. Suspect sequela of remote injury in the proximal tibia. Tibial  ghost track in the metaphysis. No significant joint effusion. Subcutaneous edema medial and laterally. No soft tissue foreign body. IMPRESSION: 1. No acute fracture or dislocation. 2. Moderate knee degenerative change and probable sequela of remote injury in the proximal tibia. 3. Subcutaneous edema. Electronically Signed   By: Narda Rutherford M.D.   On: 05/20/2022 22:12   DG Tibia/Fibula Right  Result Date: 05/20/2022 CLINICAL DATA:  Trauma, dog bite. EXAM: RIGHT TIBIA AND FIBULA - 2 VIEW COMPARISON:  None Available. FINDINGS: There is no evidence of fracture or other focal bone lesions. The cortical margins of the tibia and fibular intact. Soft  tissue defect about the medial aspect of the lower leg. There is diffuse subcutaneous edema. No radiopaque foreign body. IMPRESSION: 1. Soft tissue defect about the medial lower leg. No radiopaque foreign body or acute osseous abnormality. 2. Diffuse subcutaneous edema. 3. No acute osseous abnormalities. Electronically Signed   By: Narda Rutherford M.D.   On: 05/20/2022 22:10   DG Pelvis Portable  Result Date: 05/20/2022 CLINICAL DATA:  Trauma, dog bite. EXAM: PORTABLE PELVIS 1-2 VIEWS COMPARISON:  None Available. FINDINGS: Intact bilateral hip arthroplasties. No periprosthetic lucency or fracture. Intact bony pelvis, no rami fractures. There is mild degenerative change of the pubic symphysis which remains congruent. No sacroiliac joint diastasis. Subcutaneous edema about both hips and lateral upper thighs. IMPRESSION: 1. Intact bilateral hip arthroplasties. No acute fracture. 2. Subcutaneous edema about the hips and lateral upper thighs. Electronically Signed   By: Narda Rutherford M.D.   On: 05/20/2022 22:09   DG Chest Port 1 View  Result Date: 05/20/2022 CLINICAL DATA:  Trauma, dog bite. EXAM: PORTABLE CHEST 1 VIEW COMPARISON:  None Available. FINDINGS: The heart is normal in size. There is aortic tortuosity. No pneumothorax or pleural effusion. No focal airspace disease. On limited assessment, no acute osseous findings. IMPRESSION: 1. No acute traumatic injury to the thorax. 2. Tortuous aorta. Electronically Signed   By: Narda Rutherford M.D.   On: 05/20/2022 22:09    Anti-infectives: Anti-infectives (From admission, onward)    Start     Dose/Rate Route Frequency Ordered Stop   05/21/22 0600  Ampicillin-Sulbactam (UNASYN) 3 g in sodium chloride 0.9 % 100 mL IVPB        3 g 200 mL/hr over 30 Minutes Intravenous Every 6 hours 05/20/22 2254     05/20/22 2115  piperacillin-tazobactam (ZOSYN) IVPB 3.375 g        3.375 g 100 mL/hr over 30 Minutes Intravenous  Once 05/20/22 2110 05/20/22 2145         Assessment/Plan  Dog attack  L Ulnar fracture - per ortho, s/p I&D 5/8, splinted, awaiting definitive plan but will need fixation  LUE degloving injury RUE avulsion injury  BLE avulsion injuries Plastics consulted and planning OR next week Acute stress reaction - appreciate psych consult, seroquel BID, hydroxyzine TID prn  ABL anemia - hgb 9.1 from 11.8, repeat cbc 1200   Spoke with Animal [employer] 5/8 and confirmed that the dog that bit her was UTD on vaccines.    FEN: Reg diet, IVF @ 50cc/h VTE: LMWH ID: [email or web address] 5/8>>   LOS: 1 day   I reviewed Consultant ortho and plastics, psych notes, last 24 h vitals and pain scores, last 48 h intake and output, and last 24 h labs and trends.   Juliet Rude, [facility] Surgery 05/22/2022, 8:40 AM Please see Amion for pager number during day hours 7:00am-4:30pm

## 2022-05-22 NOTE — Progress Notes (Signed)
Occupational Therapy Treatment Patient Details Name: April Bowman MRN: [id number] DOB: 04-Mar-1955 Today's Date: 05/22/2022   History of present illness Pt is a 67 y/o female presenting after being mauled by a dog resulting in multiple bites with most significant being a degloving injury around L elbow. Underwent I&D of L UE wounds on 5/8 with wound vac placement. PMH: B hip replacements, insomnia   OT comments  Pt with excellent progress towards OT goals this session w/ pain premedication. Pt able to mobilize to/from bathroom w/ min guard assist - intermitted dizziness reported though no LOB. Pt continues to require Min-Mod A for ADLs assessed due to impaired use of L UE. Educated re: compensatory strategies for ADLs, having family assist w/ dressing in AM before they leave for work/school and bathing in the evening when family present. Pt reports friends planning to bring meals over and pt feels she can manage at home well. Recommend HHOT follow up to maximize safety and independence at home.   Recommendations for follow up therapy are one component of a multi-disciplinary discharge planning process, led by the attending physician.  Recommendations may be updated based on patient status, additional functional criteria and insurance authorization.    Assistance Recommended at Discharge Intermittent Supervision/Assistance  Patient can return home with the following  A lot of help with bathing/dressing/bathroom;Assistance with cooking/housework;Assist for transportation   Equipment Recommendations  None recommended by OT    Recommendations for Other Services      Precautions / Restrictions Precautions Precautions: Fall;Other (comment) Precaution Comments: L UE wound vac Required Braces or Orthoses: Sling Restrictions Weight Bearing Restrictions: Yes LUE Weight Bearing: Non weight bearing       Mobility Bed Mobility Overal bed mobility: Needs Assistance Bed Mobility: Supine to Sit      Supine to sit: Min assist     General bed mobility comments: light assist to scoot hips to EOB    Transfers Overall transfer level: Needs assistance Equipment used: 1 person hand held assist Transfers: Sit to/from Stand Sit to Stand: Min guard           General transfer comment: able to stand without assist from toilet and bed though dizziness noted     Balance Overall balance assessment: Needs assistance Sitting-balance support: Feet supported Sitting balance-Leahy Scale: Good     Standing balance support: Single extremity supported Standing balance-Leahy Scale: Fair                             ADL either performed or assessed with clinical judgement   ADL Overall ADL's : Needs assistance/impaired     Grooming: Supervision/safety;Standing;Oral care Grooming Details (indicate cue type and reason): granddaughter initially assisting with task w OT prompting to allow pt to attempt herself             Lower Body Dressing: Moderate assistance;Sitting/lateral leans;Sit to/from stand Lower Body Dressing Details (indicate cue type and reason): incresed time to don R sock but able to complete with one UE. discussed wearing slip on shoes, easier clothing to manage at home vs family assisting pt to get dressed before they leave for work/school Toilet Transfer: Min guard;Ambulation;Regular Toilet   Toileting- Clothing Manipulation and Hygiene: Minimal assistance;Sitting/lateral lean;Sit to/from stand Toileting - Clothing Manipulation Details (indicate cue type and reason): light assist with clothing mgmt initially but then pt able to demo wiping with R nondominant UE to complete task in standing     Functional  mobility during ADLs: Min guard General ADL Comments: Pt able to carry cup out ofbathroom and place on table. much improved mobility w/ minor reports of dizziness. discussed wound vac mgmt w/ dressing tasks    Extremity/Trunk Assessment Upper Extremity  Assessment Upper Extremity Assessment: LUE deficits/detail LUE Deficits / Details: ace wrap from midhand to mid upper arm, splinted and in sling. Adjusted sling, educated on elevation for swelling and better positioning w UE. able to wiggle first 1-2 digits, passive assist needed 3-5 digits but WFL. LUE Coordination: decreased fine motor;decreased gross motor   Lower Extremity Assessment Lower Extremity Assessment: Defer to PT evaluation        Vision   Vision Assessment?: No apparent visual deficits   Perception     Praxis      Cognition Arousal/Alertness: Awake/alert Behavior During Therapy: WFL for tasks assessed/performed, Anxious Overall Cognitive Status: Within Functional Limits for tasks assessed                                          Exercises      Shoulder Instructions       General Comments Granddaughter in room and friend. News station in to interview pt at end of OT session    Pertinent Vitals/ Pain       Pain Assessment Pain Assessment: No/denies pain  Home Living                                          Prior Functioning/Environment              Frequency  Min 2X/week        Progress Toward Goals  OT Goals(current goals can now be found in the care plan section)  Progress towards OT goals: Progressing toward goals  Acute Rehab OT Goals Patient Stated Goal: home soon OT Goal Formulation: With patient Time For Goal Achievement: 06/04/22 Potential to Achieve Goals: Good ADL Goals Pt Will Perform Upper Body Dressing: with supervision;sitting Pt Will Perform Lower Body Dressing: with supervision;sit to/from stand Pt Will Transfer to Toilet: with supervision;ambulating  Plan Discharge plan needs to be updated    Co-evaluation                 AM-PAC OT "6 Clicks" Daily Activity     Outcome Measure   Help from another person eating meals?: A Little Help from another person taking care of  personal grooming?: A Little Help from another person toileting, which includes using toliet, bedpan, or urinal?: A Little Help from another person bathing (including washing, rinsing, drying)?: A Lot Help from another person to put on and taking off regular upper body clothing?: A Lot Help from another person to put on and taking off regular lower body clothing?: A Lot 6 Click Score: 15    End of Session Equipment Utilized During Treatment: Gait belt  OT Visit Diagnosis: Other abnormalities of gait and mobility (R26.89);Muscle weakness (generalized) (M62.81);Pain Pain - Right/Left: Left Pain - part of body: Arm   Activity Tolerance Patient tolerated treatment well   Patient Left in chair;with call bell/phone within reach;with chair alarm set;with family/visitor present;Other (comment) (news station)   Nurse Communication Mobility status        Time: [phone] OT Time Calculation (min): 34 min  Charges: OT General Charges $OT Visit: 1 Visit OT Treatments $Self Care/Home Management : 23-37 mins  Bradd Canary, OTR/L Acute Rehab Services Office: [phone]   Lorre Munroe 05/22/2022, 1:26 PM

## 2022-05-23 ENCOUNTER — Telehealth: Payer: Self-pay

## 2022-05-23 LAB — CBC
HCT: 28.6 % — ABNORMAL LOW (ref 36.0–46.0)
Hemoglobin: 9.6 g/dL — ABNORMAL LOW (ref 12.0–15.0)
MCH: 31.2 pg (ref 26.0–34.0)
MCHC: 33.6 g/dL (ref 30.0–36.0)
MCV: 92.9 fL (ref 80.0–100.0)
Platelets: 224 10*3/uL (ref 150–400)
RBC: 3.08 MIL/uL — ABNORMAL LOW (ref 3.87–5.11)
RDW: 13.8 % (ref 11.5–15.5)
WBC: 8.5 10*3/uL (ref 4.0–10.5)
nRBC: 0 % (ref 0.0–0.2)

## 2022-05-23 LAB — BASIC METABOLIC PANEL
Anion gap: 7 (ref 5–15)
BUN: 13 mg/dL (ref 8–23)
CO2: 26 mmol/L (ref 22–32)
Calcium: 7.9 mg/dL — ABNORMAL LOW (ref 8.9–10.3)
Chloride: 106 mmol/L (ref 98–111)
Creatinine, Ser: 0.75 mg/dL (ref 0.44–1.00)
GFR, Estimated: >60 mL/min (ref >60)
Glucose, Bld: 98 mg/dL (ref 70–99)
Potassium: 4.2 mmol/L (ref 3.5–5.1)
Sodium: 139 mmol/L (ref 135–145)

## 2022-05-23 MED ORDER — CHOLECALCIFEROL 10 MCG (400 UNIT) PO TABS
400.0000 [IU] | ORAL_TABLET | Freq: Every day | ORAL | Status: DC
  Administered 2022-05-23 – 2022-05-28 (×6): 400 [IU] via ORAL
  Filled 2022-05-23 (×6): qty 1

## 2022-05-23 MED ORDER — POLYETHYLENE GLYCOL 3350 17 G PO PACK
17.0000 g | PACK | Freq: Two times a day (BID) | ORAL | Status: DC
  Administered 2022-05-23 – 2022-05-28 (×11): 17 g via ORAL
  Filled 2022-05-23 (×11): qty 1

## 2022-05-23 NOTE — Progress Notes (Signed)
Progress Note  3 Days Post-Op  Subjective: Pt reports pain in LUE but overall well controlled. Also reports global muscle pain. Denies abdominal pain or n/v. Denies BM since admission  Objective: Vital signs in last 24 hours: Temp:  [98.3 F (36.8 C)-98.6 F (37 C)] 98.3 F (36.8 C) (05/10 0827) Pulse Rate:  [67-73] 67 (05/10 0827) Resp:  [17-18] 17 (05/10 0827) BP: (133-140)/(62-70) 140/62 (05/10 0827) SpO2:  [96 %-99 %] 96 % (05/10 0827) Last BM Date : 05/20/22  Intake/Output from previous day: 05/09 0701 - 05/10 0700 In: 3232.7 [P.O.:720; I.V.:2412.7; IV Piggyback:100] Out: 850 [Urine:800; Drains:50] Intake/Output this shift: No intake/output data recorded.  PE: General: pleasant, WD, WN female who is laying in bed in NAD Heart: regular, rate, and rhythm.   Palpable pedal pulses bilaterally, no lower extremity edema  Lungs: CTAB, no wheezes, rhonchi, or rales noted.  Respiratory effort nonlabored Abd: soft, NT, ND, +BS, no masses, hernias, or organomegaly MS: splint and sling to LUE, L fingers NVI; avulsion wound to RUE, avulsion wounds to BLE Psych: A&Ox3 with an appropriate affect.    Lab Results:  Recent Labs    05/22/22 1520 05/23/22 0337  WBC 10.7* 8.5  HGB 10.4* 9.6*  HCT 32.6* 28.6*  PLT 266 224   BMET Recent Labs    05/22/22 0135 05/23/22 0337  NA 135 139  K 4.1 4.2  CL 104 106  CO2 25 26  GLUCOSE 115* 98  BUN 13 13  CREATININE 0.91 0.75  CALCIUM 7.7* 7.9*   PT/INR Recent Labs    05/20/22 2101  LABPROT 12.5  INR 0.9   CMP     Component Value Date/Time   NA 139 05/23/2022 0337   K 4.2 05/23/2022 0337   CL 106 05/23/2022 0337   CO2 26 05/23/2022 0337   GLUCOSE 98 05/23/2022 0337   BUN 13 05/23/2022 0337   CREATININE 0.75 05/23/2022 0337   CALCIUM 7.9 (L) 05/23/2022 0337   PROT 6.6 05/20/2022 2101   ALBUMIN 3.6 05/20/2022 2101   AST 29 05/20/2022 2101   ALT 19 05/20/2022 2101   ALKPHOS 87 05/20/2022 2101   BILITOT 0.4  05/20/2022 2101   GFRNONAA >60 05/23/2022 0337   Lipase     Component Value Date/Time   LIPASE 26 09/03/2021 0754       Studies/Results: No results found.  Anti-infectives: Anti-infectives (From admission, onward)    Start     Dose/Rate Route Frequency Ordered Stop   05/21/22 0600  Ampicillin-Sulbactam (UNASYN) 3 g in sodium chloride 0.9 % 100 mL IVPB        3 g 200 mL/hr over 30 Minutes Intravenous Every 6 hours 05/20/22 2254     05/20/22 2115  piperacillin-tazobactam (ZOSYN) IVPB 3.375 g        3.375 g 100 mL/hr over 30 Minutes Intravenous  Once 05/20/22 2110 05/20/22 2145        Assessment/Plan  Dog attack  L Ulnar fracture - per ortho, s/p I&D 5/8, splinted, awaiting definitive plan but will need fixation  LUE degloving injury RUE avulsion injury  BLE avulsion injuries - Plastics consulted and planning OR next week for myrad/VAC Acute stress reaction - appreciate psych consult, seroquel BID, hydroxyzine TID prn  ABL anemia - hgb stable 9.6  Spoke with [employer] 5/8 and confirmed that the dog that bit her was UTD on vaccines.    FEN: Reg diet, SLIV, on colace, add BID miralax VTE: LMWH ID: [email or web address]  5/8>>    LOS: 2 days   I reviewed Consultant ortho and plastics, psych notes, last 24 h vitals and pain scores, last 48 h intake and output, and last 24 h labs and trends.   Adam Phenix, [facility] Surgery 05/23/2022, 9:14 AM Please see Amion for pager number during day hours 7:00am-4:30pm

## 2022-05-23 NOTE — Telephone Encounter (Signed)
05/10 Uploaded notes and photos  [phone]

## 2022-05-23 NOTE — Progress Notes (Signed)
Occupational Therapy Treatment Patient Details Name: April Bowman MRN: [id number] DOB: 25-Dec-1955 Today's Date: 05/23/2022   History of present illness Pt is a 67 y/o female presenting after being mauled by a dog resulting in multiple bites with most significant being a degloving injury around L elbow. L distal ulna fx.  Underwent I&D of L UE wounds on 5/8 with wound vac placement. PMH: B hip replacements, insomnia   OT comments  Focus of session on assessment of LUE. Received an order to fabricate a removable splint to allow for wound vac changes. Will plan to fabricate tomorrow.    Recommendations for follow up therapy are one component of a multi-disciplinary discharge planning process, led by the attending physician.  Recommendations may be updated based on patient status, additional functional criteria and insurance authorization.    Assistance Recommended at Discharge    Patient can return home with the following  A lot of help with bathing/dressing/bathroom;Assistance with cooking/housework;Assist for transportation   Equipment Recommendations  None recommended by OT    Recommendations for Other Services      Precautions / Restrictions Precautions Precautions: Fall;Other (comment) Precaution Comments: L UE wound vac Required Braces or Orthoses: Sling Restrictions Weight Bearing Restrictions: Yes LUE Weight Bearing: Non weight bearing       Mobility Bed Mobility                    Transfers                         Balance                                           ADL either performed or assessed with clinical judgement   ADL                                              Extremity/Trunk Assessment Upper Extremity Assessment Upper Extremity Assessment: RUE deficits/detail;LUE deficits/detail RUE Deficits / Details: using functionally without difficulty LUE Deficits / Details: pt withpost op splint.  edematous hand help in dependent position. Able to make gross grasp with funcitonal strength however unablet o extend 4th and 5th digits actively. PROM WFL LUE Coordination: decreased fine motor;decreased gross motor            Vision       Perception     Praxis      Cognition                                                Exercises      Shoulder Instructions       General Comments Pt was at BP 172/112 sitting and then 136/74 post transition to chair    Pertinent Vitals/ Pain       Pain Assessment Pain Assessment: No/denies pain (medicine is "working well")  Home Living  Prior Functioning/Environment              Frequency  Min 3X/week        Progress Toward Goals  OT Goals(current goals can now be found in the care plan section)  Progress towards OT goals:  (focus of session on LUE assessment)  Acute Rehab OT Goals Patient Stated Goal: to be able to use her arm OT Goal Formulation: With patient Time For Goal Achievement: 06/04/22 Potential to Achieve Goals: Good ADL Goals Pt Will Perform Upper Body Dressing: with supervision;sitting Pt Will Perform Lower Body Dressing: with supervision;sit to/from stand Pt Will Transfer to Toilet: with supervision;ambulating  Plan Other (comment);Frequency needs to be updated (will further assess)    Co-evaluation                 AM-PAC OT "6 Clicks" Daily Activity     Outcome Measure   Help from another person eating meals?: A Little Help from another person taking care of personal grooming?: A Little Help from another person toileting, which includes using toliet, bedpan, or urinal?: A Little Help from another person bathing (including washing, rinsing, drying)?: A Lot Help from another person to put on and taking off regular upper body clothing?: A Lot Help from another person to put on and taking off regular lower body  clothing?: A Lot 6 Click Score: 15    End of Session        Activity Tolerance Patient tolerated treatment well   Patient Left in bed;with call bell/phone within reach   Nurse Communication Other (comment) (plan for splint fabrication)        Time: 1610-9604 OT Time Calculation (min): 17 min  Charges: OT General Charges $OT Visit: 1 Visit OT Treatments $Therapeutic Activity: 8-22 mins  Luisa Dago, OT/L   Acute OT Clinical Specialist Acute Rehabilitation Services Pager [phone] Office [phone]   [facility] 05/23/2022, 6:27 PM

## 2022-05-23 NOTE — Progress Notes (Addendum)
Ortho Progress Note  S: patient is s/p Right arm I&D and wound vac placement  and left forearm wound I&D and loose closure on 5/8.  Her pain is well controlled.  Plastic surgery was consulted and planning on coverage of Right arm soft tissue defect.  O: Splint in acceptable state of repair.  Able to wiggle fingers.  Only able to extend index if MCP flexed, unable to extend EPL.  AIN and ulnar motor nerves intact.  Sensation intact in median, radial, and ulnar distributions.  A/P: s/p right arm I&D and wound vac placement for large soft tissue defect and left forearm wound I&D and loose closure from dog bite.   -The radial nerve appeared intact during surgery and she does have radial distribution sensation so it is likely that her inability to extend the index finger is due to loss of EDC muscle as part of the soft tissue defect or may be a neuropraxia only affecting the motor fibers but this is less likely given the intact sensation. -Consult wound vac team for wound vac changes -Consult OT to make a removable short arm brace that immobilizes wrist and ulnar forearm to facilitate wound vac changes -recommend continued IV abx to prevent infection -Will plan on discussing with Plastics combined OR trip for fixation of right distal ulna fracture to decrease sedation events given her age.  Shaune Pollack, MD Hand & Upper Extremity Surgery [facility] [phone]

## 2022-05-23 NOTE — Consult Note (Signed)
WOC consulted for first post op NPWT dressing change. Reached out to operating surgeon Dr. Kerry Fort via phone call to his office and by [facility].  Not able to verify change schedule. Noted orders in EMR are entered to have first post op change 6/13; will update team and plan for first post op change Monday. Supplies ordered to the bedside.    April Bowman [facility], CNS, [employer] [phone]

## 2022-05-23 NOTE — Progress Notes (Signed)
Physical Therapy Treatment Patient Details Name: April Bowman MRN: [id number] DOB: [birth year] Today's Date: 05/23/2022   History of Present Illness Pt is a 67 y/o female presenting after being mauled by a dog resulting in multiple bites with most significant being a degloving injury around L elbow. Underwent I&D of L UE wounds on 5/8 with wound vac placement. PMH: B hip replacements, insomnia    PT Comments    Pt was seen for transition to chair with elevation of BP to 172/112 sitting and once in chair 136/74.  Reported to team, pt is light headed with the elevation.  However, she is getting up with help to chair and trying to be active.  Pt is motivated and will continue to recommend her to SNF care for assistance with strength and balance, endurance and quality of gait along with medical monitoring.  Follow acutely for goals of PT as outlined on POC.   Recommendations for follow up therapy are one component of a multi-disciplinary discharge planning process, led by the attending physician.  Recommendations may be updated based on patient status, additional functional criteria and insurance authorization.  Follow Up Recommendations  Can patient physically be transported by private vehicle: No    Assistance Recommended at Discharge Frequent or constant Supervision/Assistance  Patient can return home with the following A little help with walking and/or transfers;A little help with bathing/dressing/bathroom;Assistance with cooking/housework;Help with stairs or ramp for entrance;Assist for transportation   Equipment Recommendations  None recommended by PT    Recommendations for Other Services       Precautions / Restrictions Precautions Precautions: Fall;Other (comment) Precaution Comments: L UE wound vac Required Braces or Orthoses: Sling Restrictions Weight Bearing Restrictions: Yes LUE Weight Bearing: Non weight bearing     Mobility  Bed Mobility Overal bed mobility: Needs  Assistance Bed Mobility: Supine to Sit     Supine to sit: Min assist     General bed mobility comments: requires help mainly to scoot hips    Transfers Overall transfer level: Needs assistance Equipment used: 1 person hand held assist Transfers: Sit to/from Stand Sit to Stand: Min guard           General transfer comment: light headed upon sitting up but BP was very elevated    Ambulation/Gait Ambulation/Gait assistance: Min assist Gait Distance (Feet): 6 Feet Assistive device: 1 person hand held assist Gait Pattern/deviations: Step-to pattern, Decreased stride length Gait velocity: reduced Gait velocity interpretation: <1.31 ft/sec, indicative of household ambulator Pre-gait activities: standing balance ck General Gait Details: could not walk far due to her dizziness   Stairs             Wheelchair Mobility    Modified Rankin (Stroke Patients Only)       Balance Overall balance assessment: Needs assistance Sitting-balance support: Feet supported Sitting balance-Leahy Scale: Good     Standing balance support: Single extremity supported Standing balance-Leahy Scale: Fair Standing balance comment: light headed with elevation of BP pregait                            Cognition Arousal/Alertness: Awake/alert Behavior During Therapy: WFL for tasks assessed/performed, Anxious Overall Cognitive Status: Within Functional Limits for tasks assessed                                 General Comments: pt is motivated to get OOB  Exercises      General Comments General comments (skin integrity, edema, etc.): Pt was at BP 172/112 sitting and then 136/74 post transition to chair      Pertinent Vitals/Pain Pain Assessment Pain Assessment: Faces Faces Pain Scale: Hurts a little bit Pain Location: LUE Pain Descriptors / Indicators: Guarding, Grimacing Pain Intervention(s): Limited activity within patient's tolerance,  Monitored during session, Premedicated before session, Repositioned    Home Living                          Prior Function            PT Goals (current goals can now be found in the care plan section) Acute Rehab PT Goals Patient Stated Goal: to get moving again Progress towards PT goals: Progressing toward goals    Frequency    Min 3X/week      PT Plan Current plan remains appropriate    Co-evaluation              AM-PAC PT "6 Clicks" Mobility   Outcome Measure  Help needed turning from your back to your side while in a flat bed without using bedrails?: A Little Help needed moving from lying on your back to sitting on the side of a flat bed without using bedrails?: A Little Help needed moving to and from a bed to a chair (including a wheelchair)?: A Little Help needed standing up from a chair using your arms (e.g., wheelchair or bedside chair)?: A Little Help needed to walk in hospital room?: A Little Help needed climbing 3-5 steps with a railing? : A Lot 6 Click Score: 17    End of Session Equipment Utilized During Treatment: Gait belt Activity Tolerance: Patient limited by fatigue;Patient limited by pain Patient left: in chair;with call bell/phone within reach;with chair alarm set Nurse Communication: Mobility status PT Visit Diagnosis: Unsteadiness on feet (R26.81);Pain Pain - Right/Left: Left Pain - part of body: Arm     Time: 1421-1447 PT Time Calculation (min) (ACUTE ONLY): 26 min  Charges:  $Therapeutic Activity: 23-37 mins       Ivar Drape 05/23/2022, 4:14 PM  Samul Dada, PT PhD Acute Rehab Dept. Number: [facility] [id number] and [facility] [phone]

## 2022-05-23 NOTE — Progress Notes (Signed)
Pharmacy Antibiotic Note  April Bowman is a 67 y.o. female admitted on 05/20/2022 with  dog bite wound .  Pharmacy has been consulted for Unasyn dosing.  Patient presented with extensive open wounds from dog bite. Patient to undergo surgery with ortho and plastics, hopefully scheduled together. Of note: Per animal control, dog that bit her was UTD on vaccines.  Plan: Continue Unasyn 3g every 6 hours Monitor clinical progress, cultures/sensitivities, renal function, abx plan & duration  Height: 5\' 1"  (154.9 cm) Weight: 59 kg (130 lb 1.1 oz) IBW/kg (Calculated) : 47.8  Temp (24hrs), Avg:98.5 F (36.9 C), Min:98.3 F (36.8 C), Max:98.6 F (37 C)  Recent Labs  Lab 05/20/22 2101 05/20/22 2110 05/21/22 0619 05/22/22 0135 05/22/22 1520 05/23/22 0337  WBC 14.8*  --  12.5* 8.3 10.7* 8.5  CREATININE 0.82 0.70 0.77 0.91  --  0.75  LATICACIDVEN 6.1*  --   --   --   --   --      Estimated Creatinine Clearance: 57.1 mL/min (by C-G formula based on SCr of 0.75 mg/dL).    No Known Allergies  Antimicrobials this admission: Zosyn x 1 5/7 Unasyn 5/8 >>    Thank you for allowing pharmacy to be a part of this patient's care.   Signe Colt, PharmD 05/23/2022 11:00 AM  **Pharmacist phone directory can be found on [email or web address] listed under [facility]**

## 2022-05-24 MED ORDER — HYDROMORPHONE HCL 1 MG/ML IJ SOLN
0.5000 mg | INTRAMUSCULAR | Status: DC | PRN
  Administered 2022-05-25 – 2022-05-28 (×5): 1 mg via INTRAVENOUS
  Filled 2022-05-24 (×5): qty 1

## 2022-05-24 MED ORDER — OXYCODONE HCL 5 MG PO TABS
5.0000 mg | ORAL_TABLET | ORAL | Status: DC | PRN
  Administered 2022-05-24 – 2022-05-25 (×4): 10 mg via ORAL
  Administered 2022-05-25: 5 mg via ORAL
  Administered 2022-05-25 – 2022-05-28 (×10): 10 mg via ORAL
  Administered 2022-05-28: 5 mg via ORAL
  Filled 2022-05-24 (×15): qty 2

## 2022-05-24 NOTE — Progress Notes (Signed)
Progress Note  4 Days Post-Op  Subjective: Pt reports pain in LUE but overall well controlled. No other areas of pain today. Denies abdominal pain or n/v. Denies BM since admission. Voiding.   Objective: Vital signs in last 24 hours: Temp:  [97.7 F (36.5 C)-98.4 F (36.9 C)] 97.7 F (36.5 C) (05/11 0500) Pulse Rate:  [67-72] 69 (05/11 0500) Resp:  [14-18] 16 (05/11 0500) BP: (132-140)/(62-71) 132/71 (05/11 0500) SpO2:  [96 %-99 %] 96 % (05/11 0500) Last BM Date : 05/20/22  Intake/Output from previous day: 05/10 0701 - 05/11 0700 In: 0  Out: 4975 [Urine:4600; Drains:375] Intake/Output this shift: No intake/output data recorded.  PE: General: pleasant, WD, WN female who is laying in bed in NAD Heart: Reg. No LE edema  Lungs: CTA b/l. Respiratory effort nonlabored Abd: Soft, NT, ND MS: splint and sling to LUE - fingers wwp w/ SILT. LUE wound vac w/ SS drainage in cannister. RUE wound dressing cdi. LE wound dressing cdi.  Psych: A&Ox3 with an appropriate affect.    Lab Results:  Recent Labs    05/22/22 1520 05/23/22 0337  WBC 10.7* 8.5  HGB 10.4* 9.6*  HCT 32.6* 28.6*  PLT 266 224    BMET Recent Labs    05/22/22 0135 05/23/22 0337  NA 135 139  K 4.1 4.2  CL 104 106  CO2 25 26  GLUCOSE 115* 98  BUN 13 13  CREATININE 0.91 0.75  CALCIUM 7.7* 7.9*    PT/INR No results for input(s): "LABPROT", "INR" in the last 72 hours.  CMP     Component Value Date/Time   NA 139 05/23/2022 0337   K 4.2 05/23/2022 0337   CL 106 05/23/2022 0337   CO2 26 05/23/2022 0337   GLUCOSE 98 05/23/2022 0337   BUN 13 05/23/2022 0337   CREATININE 0.75 05/23/2022 0337   CALCIUM 7.9 (L) 05/23/2022 0337   PROT 6.6 05/20/2022 2101   ALBUMIN 3.6 05/20/2022 2101   AST 29 05/20/2022 2101   ALT 19 05/20/2022 2101   ALKPHOS 87 05/20/2022 2101   BILITOT 0.4 05/20/2022 2101   GFRNONAA >60 05/23/2022 0337   Lipase     Component Value Date/Time   LIPASE 26 09/03/2021 0754        Studies/Results: No results found.  Anti-infectives: Anti-infectives (From admission, onward)    Start     Dose/Rate Route Frequency Ordered Stop   05/21/22 0600  Ampicillin-Sulbactam (UNASYN) 3 g in sodium chloride 0.9 % 100 mL IVPB        3 g 200 mL/hr over 30 Minutes Intravenous Every 6 hours 05/20/22 2254     05/20/22 2115  piperacillin-tazobactam (ZOSYN) IVPB 3.375 g        3.375 g 100 mL/hr over 30 Minutes Intravenous  Once 05/20/22 2110 05/20/22 2145        Assessment/Plan Dog attack  LUE degloving injury w/ L Ulnar fracture - per ortho, s/p I&D 5/8 by Dr. Kerry Fort .  Wound vac per their team. They consulted WOCN for changes. They think "likely that her inability to extend the index finger is due to loss of EDC muscle as part of the soft tissue defect or may be a neuropraxia only affecting the motor fibers but this is less likely given the intact sensation". OT for "removable short arm brace that immobilizes wrist and ulnar forearm to facilitate wound vac changes". Ortho plans to coordinate with plastics to fix right distal ulna fx when Plastics takes  wounds to the OR for myrad/VAC.  RUE avulsion injury - Per Ortho, s/p I&D by Dr. Kerry Fort. Plastics consulted and planning OR next week for myrad/VAC BLE avulsion injuries - Plastics consulted and planning OR next week for myrad/VAC Acute stress reaction - appreciate psych consult, seroquel BID, hydroxyzine TID prn  ABL anemia - hgb stable 9.6 yesterday.   Per PA Trixie Deis note, Animal Control contacted 5/8 and confirmed that the dog that bit patient was UTD on vaccines.    FEN: Reg diet, SLIV, bowel regimen VTE: LMWH ID: [email or web address] 5/8>> (per ortho)   LOS: 3 days   I reviewed Consultant ortho and plastics  notes, last 24 h vitals and pain scores, last 48 h intake and output, and last 24 h labs and trends.   Jacinto Halim, [facility] 05/24/2022, 7:57 AM Please see Amion for pager  number during day hours 7:00am-4:30pm

## 2022-05-24 NOTE — Progress Notes (Signed)
Occupational Therapy Treatment Patient Details Name: April Bowman MRN: [id number] DOB: 12-Jan-1956 Today's Date: 05/24/2022   History of present illness Pt is a 67 y/o female presenting after being mauled by a dog resulting in multiple bites with most significant being a degloving injury around L elbow. L distal ulna fx.  Underwent I&D of L UE wounds on 5/8 with wound vac placement. PMH: B hip replacements, insomnia   OT comments  Splint fit, position, and comfort evaluated, and pt denies any discomfort reporting it feels supportive. Sling repositioned; education provided regarding positioning of arm both in bed and OOB. Pt ambulatory to restroom with min A and reporting intermittent dizziness. Discharge recommendation updated to inpatient (<3hours/day) due to pt reports family with limited amount of availability to provide assist and pt currently requiring assist with OOB at this time. Educated regarding Importance of mobility during stay.   Recommendations for follow up therapy are one component of a multi-disciplinary discharge planning process, led by the attending physician.  Recommendations may be updated based on patient status, additional functional criteria and insurance authorization.    Assistance Recommended at Discharge Intermittent Supervision/Assistance  Patient can return home with the following  A lot of help with bathing/dressing/bathroom;Assistance with cooking/housework;Assist for transportation   Equipment Recommendations  None recommended by OT    Recommendations for Other Services      Precautions / Restrictions Precautions Precautions: Fall;Other (comment) Precaution Comments: L UE wound vac Required Braces or Orthoses: Sling Restrictions Weight Bearing Restrictions: Yes LUE Weight Bearing: Non weight bearing       Mobility Bed Mobility Overal bed mobility: Needs Assistance Bed Mobility: Supine to Sit     Supine to sit: Min assist     General bed  mobility comments: To elevate trunk. Increased time    Transfers Overall transfer level: Needs assistance Equipment used: 1 person hand held assist Transfers: Sit to/from Stand Sit to Stand: Min assist           General transfer comment: to rise.     Balance Overall balance assessment: Needs assistance Sitting-balance support: Feet supported Sitting balance-Leahy Scale: Good     Standing balance support: Single extremity supported Standing balance-Leahy Scale: Fair Standing balance comment: min light headedness at EOB                           ADL either performed or assessed with clinical judgement   ADL Overall ADL's : Needs assistance/impaired                     Lower Body Dressing: Moderate assistance;Sit to/from stand   Toilet Transfer: Minimal assistance;Ambulation Toilet Transfer Details (indicate cue type and reason): HHA         Functional mobility during ADLs: Minimal assistance General ADL Comments: Return for splint check. Splint in good position throughout session; pt denies any discomfort    Extremity/Trunk Assessment Upper Extremity Assessment Upper Extremity Assessment: LUE deficits/detail;RUE deficits/detail RUE Deficits / Details: using functionally during session without any dififculty LUE Deficits / Details: Continues with edema in hand, however, significantly improved since last session (this OT present with treating OT 5/10). Wounds to dorsal and volar aspects of forearm with minimal drainiage (nursing also present and examining). Pt reporting NEW thumb numbness this session but able to detect light touch; RN notified. Functional abilities remain the same; able to extend PIP and DIP J with MCP flexed at 90 degrees easily 2nd and  3rd digits; with more difficulty to engage 4th and 5th digits. PROM WFL. Shoulder ROM <90 degrees with pt reporting shoulder issues prior to arrival. LUE Coordination: decreased fine motor;decreased  gross motor            Vision       Perception     Praxis      Cognition Arousal/Alertness: Awake/alert Behavior During Therapy: WFL for tasks assessed/performed, Anxious Overall Cognitive Status: Within Functional Limits for tasks assessed                                 General Comments: Following all commands and receptive to education provided        Exercises Exercises: Other exercises Other Exercises Other Exercises: Removal of plaster splint; dressing change discussed with and assisted by nursing. Curl ex wrap removed. Wounds observed with minimal drainage and cleaned with saline. RN present to observe wounds; wound vac remained in position for duration of session.Dressed with non-adhesive dressing, gauze, and curlex. Stockinette applied to improve barrier between skin and promote comfort. Wrist stabilized/immobile throughout Other Exercises: Fabricated ulnar gutter splint with stockinette as barrier between both skin and curlex. Careful consideration given for pt comfort and pt reporting no discomfort after donning for final attempt. Will plan to return later for skin/confort check.    Shoulder Instructions       General Comments RN present    Pertinent Vitals/ Pain       Pain Assessment Pain Assessment: Faces Faces Pain Scale: Hurts little more Pain Location: LUE with mobility Pain Descriptors / Indicators: Guarding, Grimacing Pain Intervention(s): Limited activity within patient's tolerance  Home Living                                          Prior Functioning/Environment              Frequency  Min 3X/week        Progress Toward Goals  OT Goals(current goals can now be found in the care plan section)  Progress towards OT goals: Progressing toward goals  Acute Rehab OT Goals Patient Stated Goal: go to restroom OT Goal Formulation: With patient Time For Goal Achievement: 06/04/22 Potential to Achieve Goals:  Good ADL Goals Pt Will Perform Upper Body Dressing: with supervision;sitting Pt Will Perform Lower Body Dressing: with supervision;sit to/from stand Pt Will Transfer to Toilet: with supervision;ambulating  Plan Discharge plan needs to be updated;Frequency remains appropriate    Co-evaluation                 AM-PAC OT "6 Clicks" Daily Activity     Outcome Measure   Help from another person eating meals?: A Little Help from another person taking care of personal grooming?: A Little Help from another person toileting, which includes using toliet, bedpan, or urinal?: A Little Help from another person bathing (including washing, rinsing, drying)?: A Lot Help from another person to put on and taking off regular upper body clothing?: A Lot Help from another person to put on and taking off regular lower body clothing?: A Lot 6 Click Score: 15    End of Session Equipment Utilized During Treatment: Gait belt  OT Visit Diagnosis: Other abnormalities of gait and mobility (R26.89);Muscle weakness (generalized) (M62.81);Pain Pain - Right/Left: Left Pain - part of body: Arm  Activity Tolerance Patient tolerated treatment well   Patient Left in bed;with call bell/phone within reach   Nurse Communication Other (comment) (RN present for bed change during session)        Time: 1530-1550 OT Time Calculation (min): 20 min  Charges: OT General Charges $OT Visit: 1 Visit OT Treatments $Self Care/Home Management : 8-22 mins $Therapeutic Activity: 23-37 mins $Therapeutic Exercise: 8-22 mins $Orthotics Fit/Training: 38-52 mins $ Splint materials basic: 1 Supply  Tyler Deis, OTR/L [facility] Office: [phone]   Myrla Halsted 05/24/2022, 5:56 PM

## 2022-05-24 NOTE — Progress Notes (Signed)
Occupational Therapy Treatment Patient Details Name: April Bowman MRN: [id number] DOB: 27-Jul-1955 Today's Date: 05/24/2022   History of present illness Pt is a 67 y/o female presenting after being mauled by a dog resulting in multiple bites with most significant being a degloving injury around L elbow. L distal ulna fx.  Underwent I&D of L UE wounds on 5/8 with wound vac placement. PMH: B hip replacements, insomnia   OT comments  Focus session on removal of LUE plaster splint, dressing change, and fabrication of ulnar gutter splint to be worn at all times (see media attached below). Splint fabricated per physician orders. Minimal drainage observed through pre-existing dressing. RN present and assisting during dressing change. Pt monitored for pain and tolerance of therapeutic intervention throughout session. Education on splint wear, arm positioning, digit ROM, and need for follow up rehabilitation. Pt reports limited family assist at home. Plan to return to monitor pt tolerance of splint this afternoon. .    Recommendations for follow up therapy are one component of a multi-disciplinary discharge planning process, led by the attending physician.  Recommendations may be updated based on patient status, additional functional criteria and insurance authorization.    Assistance Recommended at Discharge Intermittent Supervision/Assistance  Patient can return home with the following  A lot of help with bathing/dressing/bathroom;Assistance with cooking/housework;Assist for transportation   Equipment Recommendations  None recommended by OT    Recommendations for Other Services      Precautions / Restrictions Precautions Precautions: Fall;Other (comment) Precaution Comments: L UE wound vac Required Braces or Orthoses: Sling Restrictions Weight Bearing Restrictions: Yes LUE Weight Bearing: Non weight bearing       Mobility Bed Mobility                    Transfers                          Balance                                           ADL either performed or assessed with clinical judgement   ADL                                         General ADL Comments: Focus session on UE splinting per MD order, therapeutic exercise, education. See other exercises    Extremity/Trunk Assessment Upper Extremity Assessment Upper Extremity Assessment: LUE deficits/detail;RUE deficits/detail RUE Deficits / Details: using functionally during session without any dififculty LUE Deficits / Details: Continues with edema in hand, however, significantly improved since last session (this OT present with treating OT 5/10). Wounds to dorsal and volar aspects of forearm with minimal drainiage (nursing also present and examining). Pt reporting NEW thumb numbness this session but able to detect light touch; RN notified. Functional abilities remain the same; able to extend PIP and DIP J with MCP flexed at 90 degrees easily 2nd and 3rd digits; with more difficulty to engage 4th and 5th digits. PROM WFL. Shoulder ROM <90 degrees with pt reporting shoulder issues prior to arrival. LUE Coordination: decreased fine motor;decreased gross motor            Vision       Perception     Praxis  Cognition Arousal/Alertness: Awake/alert Behavior During Therapy: WFL for tasks assessed/performed, Anxious Overall Cognitive Status: Within Functional Limits for tasks assessed                                 General Comments: Following all commands and receptive to education provided        Exercises Exercises: Other exercises Other Exercises Other Exercises: Removal of plaster splint; dressing change discussed with and assisted by nursing. Curl ex wrap removed. Wounds observed with minimal drainage and cleaned with saline. RN present to observe wounds; wound vac remained in position for duration of session.Dressed with  non-adhesive dressing, gauze, and curlex. Stockinette applied to improve barrier between skin and promote comfort. Wrist stabilized/immobile throughout Other Exercises: Fabricated ulnar gutter splint with stockinette as barrier between both skin and curlex. Careful consideration given for pt comfort and pt reporting no discomfort after donning for final attempt. Will plan to return later for skin/confort check.    Shoulder Instructions       General Comments RN present during dressing change; pain monitored throughout session    Pertinent Vitals/ Pain       Pain Assessment Pain Assessment: Faces Faces Pain Scale: Hurts little more Pain Location: LUE with dressing change; L shoulder Pain Descriptors / Indicators: Guarding, Grimacing Pain Intervention(s): Limited activity within patient's tolerance, Monitored during session, Repositioned, Premedicated before session, Relaxation  Home Living                                          Prior Functioning/Environment              Frequency  Min 3X/week        Progress Toward Goals  OT Goals(current goals can now be found in the care plan section)  Progress towards OT goals:  (focus session on splint fabrication)  Acute Rehab OT Goals Patient Stated Goal: be able to take care of myself at home or go to rehab OT Goal Formulation: With patient Time For Goal Achievement: 06/04/22 Potential to Achieve Goals: Good ADL Goals Pt Will Perform Upper Body Dressing: with supervision;sitting Pt Will Perform Lower Body Dressing: with supervision;sit to/from stand Pt Will Transfer to Toilet: with supervision;ambulating  Plan Other (comment);Frequency remains appropriate (will further assess)    Co-evaluation                 AM-PAC OT "6 Clicks" Daily Activity     Outcome Measure   Help from another person eating meals?: A Little Help from another person taking care of personal grooming?: A Little Help from  another person toileting, which includes using toliet, bedpan, or urinal?: A Little Help from another person bathing (including washing, rinsing, drying)?: A Lot Help from another person to put on and taking off regular upper body clothing?: A Lot Help from another person to put on and taking off regular lower body clothing?: A Lot 6 Click Score: 15    End of Session    OT Visit Diagnosis: Other abnormalities of gait and mobility (R26.89);Muscle weakness (generalized) (M62.81);Pain Pain - Right/Left: Left Pain - part of body: Arm   Activity Tolerance Patient tolerated treatment well   Patient Left in bed;with call bell/phone within reach   Nurse Communication Other (comment) (Splint to be worn at all times to immobbilize wrist. Message  OT with amy issues.)        Time: 1200-1340 OT Time Calculation (min): 100 min  Charges: OT General Charges $OT Visit: 1 Visit OT Treatments $Therapeutic Activity: 23-37 mins $Therapeutic Exercise: 8-22 mins $Orthotics Fit/Training: 38-52 mins $ Splint materials basic: 1 Supply   Media Information  Document Information  Photos    05/24/2022 13:42  Attached To:  Hospital Encounter on 05/20/22  Source Information  Ward, Lorinda Creed, [place]  [facility]   Tyler Deis, OTR/L [facility] Acute Rehabilitation Office: [phone]   Myrla Halsted 05/24/2022, 4:04 PM

## 2022-05-25 MED ORDER — MAGNESIUM HYDROXIDE 400 MG/5ML PO SUSP
15.0000 mL | Freq: Once | ORAL | Status: DC
  Filled 2022-05-25: qty 30

## 2022-05-25 NOTE — Anesthesia Preprocedure Evaluation (Signed)
Anesthesia Evaluation  Patient identified by MRN, date of birth, ID band Patient awake    Reviewed: Allergy & Precautions, NPO status , Patient's Chart, lab work & pertinent test results  History of Anesthesia Complications Negative for: history of anesthetic complications  Airway Mallampati: I  TM Distance: >3 FB Neck ROM: Full    Dental  (+) Dental Advisory Given   Pulmonary sleep apnea and Continuous Positive Airway Pressure Ventilation , former smoker   breath sounds clear to auscultation       Cardiovascular negative cardio ROS  Rhythm:Regular Rate:Normal     Neuro/Psych negative neurological ROS     GI/Hepatic negative GI ROS, Neg liver ROS,,,  Endo/Other  negative endocrine ROS    Renal/GU negative Renal ROS     Musculoskeletal   Abdominal   Peds  Hematology  (+) Blood dyscrasia (Hb 9.6, plt 224k), anemia   Anesthesia Other Findings   Reproductive/Obstetrics                             Anesthesia Physical Anesthesia Plan  ASA: 2  Anesthesia Plan: General   Post-op Pain Management: Tylenol PO (pre-op)*   Induction: Intravenous  PONV Risk Score and Plan: 3 and Ondansetron, Dexamethasone and Scopolamine patch - Pre-op  Airway Management Planned: LMA  Additional Equipment: None  Intra-op Plan:   Post-operative Plan:   Informed Consent: I have reviewed the patients History and Physical, chart, labs and discussed the procedure including the risks, benefits and alternatives for the proposed anesthesia with the patient or authorized representative who has indicated his/her understanding and acceptance.     Dental advisory given  Plan Discussed with: CRNA and Surgeon  Anesthesia Plan Comments:         Anesthesia Quick Evaluation

## 2022-05-25 NOTE — Progress Notes (Signed)
Occupational Therapy Treatment Patient Details Name: April Bowman MRN: [id number] DOB: 01-10-1956 Today's Date: 05/25/2022   History of present illness Pt is a 67 y/o female presenting after being mauled by a dog resulting in multiple bites with most significant being a degloving injury around L elbow. L distal ulna fx.  Underwent I&D of L UE wounds on 5/8 with wound vac placement. PMH: B hip replacements, insomnia   OT comments  Pt progressing toward established OT goals. Focus session on education for positioning of LUE as well as LUE ROM. Pt continues with limitations in MCP J extension of the digits and abduction of 1st digit. See below. Will continue to follow.     Recommendations for follow up therapy are one component of a multi-disciplinary discharge planning process, led by the attending physician.  Recommendations may be updated based on patient status, additional functional criteria and insurance authorization.    Assistance Recommended at Discharge Intermittent Supervision/Assistance  Patient can return home with the following  A lot of help with bathing/dressing/bathroom;Assistance with cooking/housework;Assist for transportation   Equipment Recommendations  None recommended by OT    Recommendations for Other Services      Precautions / Restrictions Precautions Precautions: Fall;Other (comment) Precaution Comments: L UE wound vac Required Braces or Orthoses: Sling Restrictions Weight Bearing Restrictions: Yes LUE Weight Bearing: Non weight bearing       Mobility Bed Mobility                    Transfers                         Balance                                           ADL either performed or assessed with clinical judgement   ADL                                         General ADL Comments: Focus session on LUE ROM    Extremity/Trunk Assessment Upper Extremity Assessment Upper Extremity  Assessment: LUE deficits/detail LUE Deficits / Details: improved edema in hand; Greater ability to extend 4th ad 5th digits at PIP J, however, continus with dififculty to both extend digits at MCP and abduct thumb. Performing passively during sesion. Denying 1st digit numbness today, reporting it has improved. Splint straps out of place on arrival and splint slid slightly more distal than optimal (closer to MCP J). Doffed and reposition. Pt continues to deny any issues with splint   Lower Extremity Assessment Lower Extremity Assessment: Defer to PT evaluation        Vision       Perception     Praxis      Cognition   Behavior During Therapy: [facility] for tasks assessed/performed, Anxious Overall Cognitive Status: Within Functional Limits for tasks assessed                                          Exercises Exercises: Hand exercises Hand Exercises Digit Composite Flexion: AROM, Left, 20 reps Composite Extension: AAROM, Left, 20 reps (see LUE comments) Thumb Abduction: PROM, Left,  20 reps Thumb Adduction: Left, 20 reps, AAROM Other Exercises Other Exercises: repositioning on UE on pillows with hand above elbow. Continued education on importance.    Shoulder Instructions       General Comments      Pertinent Vitals/ Pain       Pain Assessment Pain Assessment: Faces Faces Pain Scale: Hurts a little bit Pain Location: LUE Pain Descriptors / Indicators: Guarding, Grimacing Pain Intervention(s): Limited activity within patient's tolerance  Home Living                                          Prior Functioning/Environment              Frequency  Min 3X/week        Progress Toward Goals  OT Goals(current goals can now be found in the care plan section)  Progress towards OT goals: Progressing toward goals  Acute Rehab OT Goals Patient Stated Goal: get my wrist surgery OT Goal Formulation: With patient Time For Goal  Achievement: 06/04/22 Potential to Achieve Goals: Good ADL Goals Pt Will Perform Upper Body Dressing: with supervision;sitting Pt Will Perform Lower Body Dressing: with supervision;sit to/from stand Pt Will Transfer to Toilet: with supervision;ambulating  Plan Discharge plan needs to be updated;Frequency remains appropriate    Co-evaluation                 AM-PAC OT "6 Clicks" Daily Activity     Outcome Measure   Help from another person eating meals?: A Little Help from another person taking care of personal grooming?: A Little Help from another person toileting, which includes using toliet, bedpan, or urinal?: A Little Help from another person bathing (including washing, rinsing, drying)?: A Lot Help from another person to put on and taking off regular upper body clothing?: A Lot Help from another person to put on and taking off regular lower body clothing?: A Lot 6 Click Score: 15    End of Session    OT Visit Diagnosis: Other abnormalities of gait and mobility (R26.89);Muscle weakness (generalized) (M62.81);Pain Pain - Right/Left: Left Pain - part of body: Arm   Activity Tolerance Patient tolerated treatment well   Patient Left in bed;with call bell/phone within reach   Nurse Communication Other (comment) (Ok to see pt; potision of splint)        Time: 1610-9604 OT Time Calculation (min): 18 min  Charges: OT General Charges $OT Visit: 1 Visit OT Treatments $Therapeutic Exercise: 8-22 mins  April Bowman, OTR/L [facility] Acute Rehabilitation Office: [phone]   Myrla Halsted 05/25/2022, 6:05 PM

## 2022-05-25 NOTE — Progress Notes (Signed)
Progress Note  5 Days Post-Op  Subjective: Pt reports pain in LUE but overall well controlled with po medications. No other areas of pain today. Denies abdominal pain or n/v. Denies BM since admission. Voiding. OT fabricated ulnar gutter splint yesterday.   Objective: Vital signs in last 24 hours: Temp:  [98 F (36.7 C)-98.7 F (37.1 C)] 98.2 F (36.8 C) (05/12 0720) Pulse Rate:  [61-75] 61 (05/12 0720) Resp:  [16-17] 16 (05/12 0720) BP: (130-142)/(58-71) 130/58 (05/12 0720) SpO2:  [94 %-99 %] 97 % (05/12 0720) Last BM Date : 05/20/22  Intake/Output from previous day: 05/11 0701 - 05/12 0700 In: 600 [P.O.:600] Out: 2500 [Urine:2400; Drains:100] Intake/Output this shift: No intake/output data recorded.  PE: General: pleasant, WD, WN female who is laying in bed in NAD Heart: Reg. No LE edema  Lungs: CTA b/l. Respiratory effort nonlabored Abd: Soft, NT, ND MS: splint and sling to LUE - fingers wwp w/ SILT. LUE wound vac w/ SS drainage in cannister. RUE wound dressing cdi. BLE wound dressing cdi.  Psych: A&Ox3 with an appropriate affect.    Lab Results:  Recent Labs    05/22/22 1520 05/23/22 0337  WBC 10.7* 8.5  HGB 10.4* 9.6*  HCT 32.6* 28.6*  PLT 266 224    BMET Recent Labs    05/23/22 0337  NA 139  K 4.2  CL 106  CO2 26  GLUCOSE 98  BUN 13  CREATININE 0.75  CALCIUM 7.9*    PT/INR No results for input(s): "LABPROT", "INR" in the last 72 hours.  CMP     Component Value Date/Time   NA 139 05/23/2022 0337   K 4.2 05/23/2022 0337   CL 106 05/23/2022 0337   CO2 26 05/23/2022 0337   GLUCOSE 98 05/23/2022 0337   BUN 13 05/23/2022 0337   CREATININE 0.75 05/23/2022 0337   CALCIUM 7.9 (L) 05/23/2022 0337   PROT 6.6 05/20/2022 2101   ALBUMIN 3.6 05/20/2022 2101   AST 29 05/20/2022 2101   ALT 19 05/20/2022 2101   ALKPHOS 87 05/20/2022 2101   BILITOT 0.4 05/20/2022 2101   GFRNONAA >60 05/23/2022 0337   Lipase     Component Value Date/Time    LIPASE 26 09/03/2021 0754       Studies/Results: No results found.  Anti-infectives: Anti-infectives (From admission, onward)    Start     Dose/Rate Route Frequency Ordered Stop   05/21/22 0600  Ampicillin-Sulbactam (UNASYN) 3 g in sodium chloride 0.9 % 100 mL IVPB        3 g 200 mL/hr over 30 Minutes Intravenous Every 6 hours 05/20/22 2254     05/20/22 2115  piperacillin-tazobactam (ZOSYN) IVPB 3.375 g        3.375 g 100 mL/hr over 30 Minutes Intravenous  Once 05/20/22 2110 05/20/22 2145        Assessment/Plan Dog attack  LUE degloving injury w/ L Ulnar fracture - per ortho, s/p I&D 5/8 by Dr. Kerry Fort .  Wound vac per their team. They consulted WOCN for changes. They think "likely that her inability to extend the index finger is due to loss of EDC muscle as part of the soft tissue defect or may be a neuropraxia only affecting the motor fibers but this is less likely given the intact sensation". OT for "removable short arm brace that immobilizes wrist and ulnar forearm to facilitate wound vac changes". Ortho plans to coordinate with plastics to fix right distal ulna fx when Plastics takes wounds  to the OR for myrad/VAC.  RUE avulsion injury - Per Ortho, s/p I&D by Dr. Kerry Fort. Plastics consulted and planning OR next week for myrad/VAC BLE avulsion injuries - Plastics consulted and planning OR next week for myrad/VAC Acute stress reaction - appreciate psych consult, seroquel BID, hydroxyzine TID prn  ABL anemia - hgb stable 9.6 5/10.   Per PA Trixie Deis note, Animal Control contacted 5/8 and confirmed that the dog that bit patient was UTD on vaccines.    FEN: Reg diet, SLIV, BID colace and Miralax. Milk of Mag today.  VTE: LMWH ID: [email or web address] 5/8>> (per ortho)   LOS: 4 days   I reviewed therapy notes, last 24 h vitals and pain scores, last 48 h intake and output, and last 24 h labs and trends.   Jacinto Halim, [facility] Surgery 05/25/2022, 7:42  AM Please see Amion for pager number during day hours 7:00am-4:30pm

## 2022-05-26 ENCOUNTER — Encounter (HOSPITAL_COMMUNITY): Admission: EM | Disposition: A | Discharge status: Home Health | Payer: Self-pay | Source: Home / Self Care

## 2022-05-26 ENCOUNTER — Other Ambulatory Visit: Payer: Self-pay

## 2022-05-26 ENCOUNTER — Telehealth: Payer: Self-pay

## 2022-05-26 ENCOUNTER — Inpatient Hospital Stay (HOSPITAL_COMMUNITY): Payer: Medicare HMO | Admitting: Anesthesiology

## 2022-05-26 ENCOUNTER — Telehealth: Payer: Self-pay | Admitting: *Deleted

## 2022-05-26 ENCOUNTER — Encounter (HOSPITAL_COMMUNITY): Payer: Self-pay

## 2022-05-26 DIAGNOSIS — G4733 Obstructive sleep apnea (adult) (pediatric): Secondary | ICD-10-CM

## 2022-05-26 DIAGNOSIS — W540XXA Bitten by dog, initial encounter: Secondary | ICD-10-CM | POA: Diagnosis not present

## 2022-05-26 DIAGNOSIS — S41152A Open bite of left upper arm, initial encounter: Secondary | ICD-10-CM

## 2022-05-26 DIAGNOSIS — Z9989 Dependence on other enabling machines and devices: Secondary | ICD-10-CM

## 2022-05-26 DIAGNOSIS — S51852A Open bite of left forearm, initial encounter: Secondary | ICD-10-CM | POA: Diagnosis not present

## 2022-05-26 DIAGNOSIS — D649 Anemia, unspecified: Secondary | ICD-10-CM

## 2022-05-26 HISTORY — PX: DEBRIDEMENT AND CLOSURE WOUND: SHX5614

## 2022-05-26 SURGERY — DEBRIDEMENT, WOUND, WITH CLOSURE
Anesthesia: General | Site: Arm Upper | Laterality: Left

## 2022-05-26 MED ORDER — BACITRACIN ZINC 500 UNIT/GM EX OINT
TOPICAL_OINTMENT | CUTANEOUS | Status: AC
  Filled 2022-05-26: qty 28.35

## 2022-05-26 MED ORDER — VASHE WOUND IRRIGATION OPTIME
TOPICAL | Status: DC | PRN
  Administered 2022-05-26: 34 [oz_av]

## 2022-05-26 MED ORDER — OXYCODONE HCL 5 MG PO TABS: 5.0000 mg | ORAL_TABLET | Freq: Once | ORAL | Status: DC | PRN

## 2022-05-26 MED ORDER — LIDOCAINE 2% (20 MG/ML) 5 ML SYRINGE
INTRAMUSCULAR | Status: AC
  Filled 2022-05-26: qty 5

## 2022-05-26 MED ORDER — CEFAZOLIN SODIUM-DEXTROSE 1-4 GM/50ML-% IV SOLN
INTRAVENOUS | Status: DC | PRN
  Administered 2022-05-26: 2 g via INTRAVENOUS

## 2022-05-26 MED ORDER — FENTANYL CITRATE (PF) 250 MCG/5ML IJ SOLN
INTRAMUSCULAR | Status: DC | PRN
  Administered 2022-05-26: 100 ug via INTRAVENOUS

## 2022-05-26 MED ORDER — ONDANSETRON HCL 4 MG/2ML IJ SOLN
INTRAMUSCULAR | Status: DC | PRN
  Administered 2022-05-26: 4 mg via INTRAVENOUS

## 2022-05-26 MED ORDER — CEFAZOLIN SODIUM-DEXTROSE 2-4 GM/100ML-% IV SOLN
2.0000 g | INTRAVENOUS | Status: DC
  Filled 2022-05-26: qty 100

## 2022-05-26 MED ORDER — DEXAMETHASONE SODIUM PHOSPHATE 10 MG/ML IJ SOLN
INTRAMUSCULAR | Status: DC | PRN
  Administered 2022-05-26: 10 mg via INTRAVENOUS

## 2022-05-26 MED ORDER — DEXAMETHASONE SODIUM PHOSPHATE 10 MG/ML IJ SOLN
INTRAMUSCULAR | Status: AC
  Filled 2022-05-26: qty 1

## 2022-05-26 MED ORDER — HYDROMORPHONE HCL 1 MG/ML IJ SOLN
INTRAMUSCULAR | Status: AC
  Filled 2022-05-26: qty 1

## 2022-05-26 MED ORDER — MIDAZOLAM HCL 2 MG/2ML IJ SOLN
INTRAMUSCULAR | Status: AC
  Filled 2022-05-26: qty 2

## 2022-05-26 MED ORDER — EPHEDRINE SULFATE-NACL 50-0.9 MG/10ML-% IV SOSY
PREFILLED_SYRINGE | INTRAVENOUS | Status: DC | PRN
  Administered 2022-05-26: 5 mg via INTRAVENOUS

## 2022-05-26 MED ORDER — PROMETHAZINE HCL 25 MG/ML IJ SOLN: 6.2500 mg | INTRAMUSCULAR | Status: DC | PRN

## 2022-05-26 MED ORDER — EPHEDRINE 5 MG/ML INJ
INTRAVENOUS | Status: AC
  Filled 2022-05-26: qty 5

## 2022-05-26 MED ORDER — ACETAMINOPHEN 10 MG/ML IV SOLN
INTRAVENOUS | Status: AC
  Filled 2022-05-26: qty 100

## 2022-05-26 MED ORDER — LIDOCAINE-EPINEPHRINE 1 %-1:100000 IJ SOLN
INTRAMUSCULAR | Status: AC
  Filled 2022-05-26: qty 1

## 2022-05-26 MED ORDER — PHENYLEPHRINE 80 MCG/ML (10ML) SYRINGE FOR IV PUSH (FOR BLOOD PRESSURE SUPPORT)
PREFILLED_SYRINGE | INTRAVENOUS | Status: DC | PRN
  Administered 2022-05-26: 160 ug via INTRAVENOUS

## 2022-05-26 MED ORDER — MEPERIDINE HCL 25 MG/ML IJ SOLN: 6.2500 mg | INTRAMUSCULAR | Status: DC | PRN

## 2022-05-26 MED ORDER — SCOPOLAMINE 1 MG/3DAYS TD PT72
1.0000 | MEDICATED_PATCH | TRANSDERMAL | Status: DC
  Filled 2022-05-26: qty 1

## 2022-05-26 MED ORDER — MIDAZOLAM HCL 2 MG/2ML IJ SOLN: 0.5000 mg | Freq: Once | INTRAMUSCULAR | Status: DC | PRN

## 2022-05-26 MED ORDER — PROPOFOL 10 MG/ML IV BOLUS
INTRAVENOUS | Status: DC | PRN
  Administered 2022-05-26: 150 mg via INTRAVENOUS

## 2022-05-26 MED ORDER — CHLORHEXIDINE GLUCONATE CLOTH 2 % EX PADS: 6.0000 | MEDICATED_PAD | Freq: Once | CUTANEOUS | Status: DC

## 2022-05-26 MED ORDER — BUPIVACAINE-EPINEPHRINE (PF) 0.5% -1:200000 IJ SOLN
INTRAMUSCULAR | Status: AC
  Filled 2022-05-26: qty 30

## 2022-05-26 MED ORDER — MIDAZOLAM HCL 2 MG/2ML IJ SOLN
INTRAMUSCULAR | Status: DC | PRN
  Administered 2022-05-26: 2 mg via INTRAVENOUS

## 2022-05-26 MED ORDER — PHENYLEPHRINE 80 MCG/ML (10ML) SYRINGE FOR IV PUSH (FOR BLOOD PRESSURE SUPPORT)
PREFILLED_SYRINGE | INTRAVENOUS | Status: AC
  Filled 2022-05-26: qty 10

## 2022-05-26 MED ORDER — HYDROMORPHONE HCL 1 MG/ML IJ SOLN
0.2500 mg | INTRAMUSCULAR | Status: DC | PRN
  Administered 2022-05-26: 0.5 mg via INTRAVENOUS

## 2022-05-26 MED ORDER — LIDOCAINE 2% (20 MG/ML) 5 ML SYRINGE
INTRAMUSCULAR | Status: DC | PRN
  Administered 2022-05-26: 30 mg via INTRAVENOUS

## 2022-05-26 MED ORDER — ONDANSETRON HCL 4 MG/2ML IJ SOLN
INTRAMUSCULAR | Status: AC
  Filled 2022-05-26: qty 2

## 2022-05-26 MED ORDER — CEFAZOLIN SODIUM-DEXTROSE 2-4 GM/100ML-% IV SOLN: 2.0000 g | INTRAVENOUS | Status: DC

## 2022-05-26 MED ORDER — PROPOFOL 10 MG/ML IV BOLUS
INTRAVENOUS | Status: AC
  Filled 2022-05-26: qty 20

## 2022-05-26 MED ORDER — 0.9 % SODIUM CHLORIDE (POUR BTL) OPTIME
TOPICAL | Status: DC | PRN
  Administered 2022-05-26: 1000 mL

## 2022-05-26 MED ORDER — ACETAMINOPHEN 10 MG/ML IV SOLN
INTRAVENOUS | Status: DC | PRN
  Administered 2022-05-26: 1000 mg via INTRAVENOUS

## 2022-05-26 MED ORDER — PHENYLEPHRINE HCL-NACL 20-0.9 MG/250ML-% IV SOLN
INTRAVENOUS | Status: DC | PRN
  Administered 2022-05-26: 25 ug/min via INTRAVENOUS

## 2022-05-26 MED ORDER — OXYCODONE HCL 5 MG/5ML PO SOLN: 5.0000 mg | Freq: Once | ORAL | Status: DC | PRN

## 2022-05-26 MED ORDER — FENTANYL CITRATE (PF) 250 MCG/5ML IJ SOLN
INTRAMUSCULAR | Status: AC
  Filled 2022-05-26: qty 5

## 2022-05-26 MED ORDER — LACTATED RINGERS IV SOLN: INTRAVENOUS | Status: DC | PRN

## 2022-05-26 SURGICAL SUPPLY — 60 items
BINDER BREAST MEDIUM (GAUZE/BANDAGES/DRESSINGS) IMPLANT
BINDER BREAST XLRG (GAUZE/BANDAGES/DRESSINGS) IMPLANT
BINDER BREAST XXLRG (GAUZE/BANDAGES/DRESSINGS) IMPLANT
BLADE CLIPPER SURG (BLADE) IMPLANT
BLADE SURG 10 STRL SS (BLADE) IMPLANT
BLADE SURG 15 STRL LF DISP TIS (BLADE) ×1 IMPLANT
BLADE SURG 15 STRL SS (BLADE) ×1
BNDG GAUZE DERMACEA FLUFF 4 (GAUZE/BANDAGES/DRESSINGS) IMPLANT
CANISTER WOUNDNEG PRESSURE 500 (CANNISTER) IMPLANT
CLEANSER WND VASHE 34 (WOUND CARE) IMPLANT
COVER BACK TABLE 60X90IN (DRAPES) ×1 IMPLANT
COVER MAYO STAND STRL (DRAPES) ×1 IMPLANT
DERMABOND ADVANCED .7 DNX12 (GAUZE/BANDAGES/DRESSINGS) IMPLANT
DRAIN CHANNEL 19F RND (DRAIN) IMPLANT
DRAPE CHEST BREAST 15X10 FENES (DRAPES) IMPLANT
DRAPE INCISE IOBAN 66X45 STRL (DRAPES) IMPLANT
DRAPE LAPAROTOMY 100X72 PEDS (DRAPES) IMPLANT
DRAPE SURG 17X23 STRL (DRAPES) IMPLANT
DRAPE U-SHAPE 76X120 STRL (DRAPES) IMPLANT
DRSG ADAPTIC 3X8 NADH LF (GAUZE/BANDAGES/DRESSINGS) IMPLANT
DRSG CUTIMED SORBACT 7X9 (GAUZE/BANDAGES/DRESSINGS) IMPLANT
DRSG EMULSION OIL 3X3 NADH (GAUZE/BANDAGES/DRESSINGS) IMPLANT
DRSG HYDROCOLLOID 4X4 (GAUZE/BANDAGES/DRESSINGS) IMPLANT
DRSG TEGADERM 4X4.75 (GAUZE/BANDAGES/DRESSINGS) IMPLANT
DRSG TELFA 3X8 NADH STRL (GAUZE/BANDAGES/DRESSINGS) IMPLANT
DRSG VAC GRANUFOAM MED (GAUZE/BANDAGES/DRESSINGS) IMPLANT
ELECT REM PT RETURN 9FT ADLT (ELECTROSURGICAL) ×1
ELECTRODE REM PT RTRN 9FT ADLT (ELECTROSURGICAL) ×1 IMPLANT
EVACUATOR SILICONE 100CC (DRAIN) IMPLANT
GAUZE PAD ABD 8X10 STRL (GAUZE/BANDAGES/DRESSINGS) IMPLANT
GAUZE SPONGE 4X4 12PLY STRL (GAUZE/BANDAGES/DRESSINGS) IMPLANT
GAUZE XEROFORM 1X8 LF (GAUZE/BANDAGES/DRESSINGS) IMPLANT
GAUZE XEROFORM 5X9 LF (GAUZE/BANDAGES/DRESSINGS) IMPLANT
GLOVE BIO SURGEON STRL SZ 6.5 (GLOVE) ×2 IMPLANT
GOWN STRL REUS W/ TWL LRG LVL3 (GOWN DISPOSABLE) ×2 IMPLANT
GOWN STRL REUS W/TWL LRG LVL3 (GOWN DISPOSABLE) ×2
GRAFT MYRIAD 3 LAYER 10X10 (Graft) IMPLANT
KIT BASIN OR (CUSTOM PROCEDURE TRAY) ×1 IMPLANT
NDL HYPO 25X1 1.5 SAFETY (NEEDLE) ×1 IMPLANT
NEEDLE HYPO 25X1 1.5 SAFETY (NEEDLE) ×1 IMPLANT
NS IRRIG 1000ML POUR BTL (IV SOLUTION) ×1 IMPLANT
PACK GENERAL/GYN (CUSTOM PROCEDURE TRAY) ×1 IMPLANT
PAD ABD 8X10 STRL (GAUZE/BANDAGES/DRESSINGS) IMPLANT
PENCIL SMOKE EVACUATOR (MISCELLANEOUS) ×1 IMPLANT
POWDER MORCELSS FINE 2000MG (Miscellaneous) IMPLANT
PWDR MORCELSS FINE 2000MG (Miscellaneous) ×1 IMPLANT
SHEET MEDIUM DRAPE 40X70 STRL (DRAPES) IMPLANT
SPIKE FLUID TRANSFER (MISCELLANEOUS) IMPLANT
SPONGE T-LAP 18X18 ~~LOC~~+RFID (SPONGE) ×1 IMPLANT
STAPLER VISISTAT 35W (STAPLE) IMPLANT
SUT MON AB 5-0 PS2 18 (SUTURE) IMPLANT
SUT SILK 3 0 PS 1 (SUTURE) IMPLANT
SWAB COLLECTION DEVICE MRSA (MISCELLANEOUS) IMPLANT
SWAB CULTURE ESWAB REG 1ML (MISCELLANEOUS) IMPLANT
SYR BULB IRRIG 60ML STRL (SYRINGE) IMPLANT
SYR CONTROL 10ML LL (SYRINGE) ×1 IMPLANT
TOWEL GREEN STERILE FF (TOWEL DISPOSABLE) ×2 IMPLANT
TUBE CONNECTING 20X1/4 (TUBING) ×1 IMPLANT
UNDERPAD 30X36 HEAVY ABSORB (UNDERPADS AND DIAPERS) ×1 IMPLANT
YANKAUER SUCT BULB TIP NO VENT (SUCTIONS) ×1 IMPLANT

## 2022-05-26 NOTE — Progress Notes (Signed)
Chaplain responded to [facility] consult for support. Chaplain asked open ended questions to facilitate emotional expression and story telling. April Bowman shared the story about being attacked by her neighbor's do and the previous attack the same dog had made to a family friend and her small dog. She shared about her significant injuries and her worries about managing her care needs when she is back home. She is beginning to notice the impacts the traumatic experience had on her as she is feeling increased anxiety particularly in quiet moments. Chaplain normalized the variety of responses after a traumatic experience and the benefits of support. April Bowman is open to continued support.   Maryanna Shape. Carley Hammed, M.Div. [facility] Chaplain Pager [phone] Office [phone]

## 2022-05-26 NOTE — Transfer of Care (Signed)
Immediate Anesthesia Transfer of Care Note  Patient: [name]  Procedure(s) Performed: ARM DEBRIDEMENT WITH MYRIAD PLACEMENT AND CHANGE WOUND VAC (Left: Arm Upper)  Patient Location: PACU  Anesthesia Type:General  Level of Consciousness: drowsy  Airway & Oxygen Therapy: Patient Spontanous Breathing and Patient connected to face mask oxygen  Post-op Assessment: Report given to RN  Post vital signs: Reviewed and stable  Last Vitals:  Vitals Value Taken Time  BP 144/68 05/26/22 0830  Temp 97.8   Pulse 68   Resp 19 05/26/22 0834  SpO2 100   Vitals shown include unvalidated device data.  Last Pain:  Vitals:   05/26/22 0618  TempSrc: Oral  PainSc:       Patients Stated Pain Goal: 0 (05/26/22 0301)  Complications: No notable events documented.

## 2022-05-26 NOTE — Anesthesia Procedure Notes (Addendum)
Procedure Name: LMA Insertion Date/Time: 05/26/2022 7:40 AM  Performed by: Alvera Novel, CRNAPre-anesthesia Checklist: Patient identified, Emergency Drugs available, Suction available and Patient being monitored Patient Re-evaluated:Patient Re-evaluated prior to induction Oxygen Delivery Method: Circle System Utilized Preoxygenation: Pre-oxygenation with 100% oxygen Induction Type: IV induction Ventilation: Mask ventilation without difficulty LMA: LMA inserted LMA Size: 3.0 Number of attempts: 1 Placement Confirmation: positive ETCO2 Tube secured with: Tape Dental Injury: Teeth and Oropharynx as per pre-operative assessment  Comments: Inserted by Terri Piedra

## 2022-05-26 NOTE — Discharge Instructions (Signed)
Wound Care: Gently cleanse wounds to right forearm, right ankle, and left knee with warm sterile saline.  Apply Xeroform gauze followed by 4 x 4's and loosely wrapped Kerlix.

## 2022-05-26 NOTE — Op Note (Signed)
DATE OF OPERATION: 05/26/2022  LOCATION: [facility] Operating Room  PREOPERATIVE DIAGNOSIS: left arm wound/dog bite  POSTOPERATIVE DIAGNOSIS: Same  PROCEDURE: Preparation of Left arm wound 9 x 13 cm for placement of Myriad powder 2 gm and sheet 10 x 10 cm, placement of VAC.  SURGEON: Remona Boom Sanger Debroh Sieloff, DO  ASSISTANT: Caroline More, PA  EBL: none  CONDITION: Stable  COMPLICATIONS: None  INDICATION: The patient, April Bowman, is a 67 y.o. female born on 1955-03-05, is here for treatment of a severe left arm dog bite/wound.   PROCEDURE DETAILS:  The patient was seen prior to surgery and marked.  The IV antibiotics were given. The patient was taken to the operating room and given a general anesthetic. A standard time out was performed and all information was confirmed by those in the room.  The left arm was irrigated with Vashe.  All of the Myriad powder and sheet was applied and secured to the 9 x 13 cm skin with 5-0 Vicryl.   The sorbact was placed and secured to the skin.  The vac was applied and had excellent pressure. The patient was allowed to wake up and taken to recovery room in stable condition at the end of the case. The family was notified at the end of the case.   The advanced practice practitioner (APP) assisted throughout the case.  The APP was essential in retraction and counter traction when needed to make the case progress smoothly.  This retraction and assistance made it possible to see the tissue plans for the procedure.  The assistance was needed for blood control, tissue re-approximation and assisted with closure of the incision site.

## 2022-05-26 NOTE — Progress Notes (Addendum)
Progress Note  Day of Surgery  Subjective: Pt taken to the OR with plastics this AM for LUE soft tissue injury. She is overall doing well. She lives at home with her 2 grandchildren usually. Her grandson works and is gone from around 8 AM to 9 PM daily. Her granddaughter is in school from 8 AM to 4 PM daily and she and her mother will be working after she gets done with school to cover the patient's work. She has a boyfriend who works from about 7 AM to mid afternoon also but no one who can be home with her during the days.   Objective: Vital signs in last 24 hours: Temp:  [97.7 F (36.5 C)-98.3 F (36.8 C)] 97.7 F (36.5 C) (05/13 0923) Pulse Rate:  [63-80] 63 (05/13 0923) Resp:  [12-20] 16 (05/13 0923) BP: (121-155)/(58-77) 141/68 (05/13 0923) SpO2:  [94 %-100 %] 100 % (05/13 0923) Last BM Date : 05/25/22  Intake/Output from previous day: 05/12 0701 - 05/13 0700 In: 480 [P.O.:480] Out: 6280 [Urine:6200; Drains:80] Intake/Output this shift: Total I/O In: 450 [I.V.:450] Out: 50 [Blood:50]  PE: General: pleasant, WD, WN female who is sitting bed in NAD Heart: Reg. No LE edema  Lungs: CTA b/l. Respiratory effort nonlabored Abd: Soft, NT, ND MS: splint and sling to LUE - fingers wwp w/ SILT. LUE wound vac w/ SS drainage in cannister. RUE wound dressing cdi. BLE wound dressing cdi.  Psych: A&Ox3 with an appropriate affect.    Lab Results:  No results for input(s): "WBC", "HGB", "HCT", "PLT" in the last 72 hours.  BMET No results for input(s): "NA", "K", "CL", "CO2", "GLUCOSE", "BUN", "CREATININE", "CALCIUM" in the last 72 hours.  PT/INR No results for input(s): "LABPROT", "INR" in the last 72 hours.  CMP     Component Value Date/Time   NA 139 05/23/2022 0337   K 4.2 05/23/2022 0337   CL 106 05/23/2022 0337   CO2 26 05/23/2022 0337   GLUCOSE 98 05/23/2022 0337   BUN 13 05/23/2022 0337   CREATININE 0.75 05/23/2022 0337   CALCIUM 7.9 (L) 05/23/2022 0337   PROT 6.6  05/20/2022 2101   ALBUMIN 3.6 05/20/2022 2101   AST 29 05/20/2022 2101   ALT 19 05/20/2022 2101   ALKPHOS 87 05/20/2022 2101   BILITOT 0.4 05/20/2022 2101   GFRNONAA >60 05/23/2022 0337   Lipase     Component Value Date/Time   LIPASE 26 09/03/2021 0754       Studies/Results: No results found.  Anti-infectives: Anti-infectives (From admission, onward)    Start     Dose/Rate Route Frequency Ordered Stop   05/26/22 0715  ceFAZolin (ANCEF) IVPB 2g/100 mL premix  Status:  Discontinued        2 g 200 mL/hr over 30 Minutes Intravenous On call to O.R. 05/26/22 4098 05/26/22 0915   05/26/22 0600  ceFAZolin (ANCEF) IVPB 2g/100 mL premix  Status:  Discontinued        2 g 200 mL/hr over 30 Minutes Intravenous On call to O.R. 05/26/22 0246 05/26/22 0915   05/21/22 0600  Ampicillin-Sulbactam (UNASYN) 3 g in sodium chloride 0.9 % 100 mL IVPB        3 g 200 mL/hr over 30 Minutes Intravenous Every 6 hours 05/20/22 2254     05/20/22 2115  piperacillin-tazobactam (ZOSYN) IVPB 3.375 g        3.375 g 100 mL/hr over 30 Minutes Intravenous  Once 05/20/22 2110 05/20/22 2145  Assessment/Plan Dog attack  LUE degloving injury w/ L Ulnar fracture - per ortho, s/p I&D 5/8 by Dr. Kerry Fort .  Wound vac per their team. They consulted WOCN for changes. They think "likely that her inability to extend the index finger is due to loss of EDC muscle as part of the soft tissue defect or may be a neuropraxia only affecting the motor fibers but this is less likely given the intact sensation". OT for "removable short arm brace that immobilizes wrist and ulnar forearm to facilitate wound vac changes". Ortho ok with outpatient follow up at this time.  RUE avulsion injury - Per Ortho, s/p I&D by Dr. Kerry Fort. Wound care per ortho/plastics  BLE avulsion injuries - Plastics consulted and took patient to OR for myriad/VAC this AM, will plan to take back next week but ok to DC in the interim Acute stress  reaction - appreciate psych consult, seroquel BID, hydroxyzine TID prn  ABL anemia - hgb stable 9.6 5/10.   Animal Control contacted 5/8 and confirmed that the dog that bit patient was UTD on vaccines.    FEN: Reg diet, SLIV, BID colace and Miralax VTE: LMWH ID: [email or web address] 5/8>> (per ortho)  Dispo: PT/OT, suspect patient will likely need SNF given lack of support for long periods during the day.    LOS: 5 days   I reviewed Consultant ortho and plastics notes, last 24 h vitals and pain scores, last 48 h intake and output.Juliet Rude, [facility] Surgery 05/26/2022, 10:52 AM Please see Amion for pager number during day hours 7:00am-4:30pm

## 2022-05-26 NOTE — TOC Progression Note (Signed)
Transition of Care [facility]) - Progression Note    Patient Details  Name: April Bowman MRN: [id number] Date of Birth: Nov 18, 1955  [facility] of [facility] CM/SW Contact  Glennon Mac, RN Phone Number: 05/26/2022, 3:45pm  Clinical Narrative:    PT/OT continuing to recommend SNF placement; discussed with patient, and she adamantly refuses.  She states she will be able to manage on her own, and states she has 'friends" who will be able to bring her lunch and check on her at intervals.  Patient will need wound VAC for home, and is anticipating further surgery on left arm next week.  Patient prefers discharge home with home health services and family/friends support.  Will initiate insurance authorization with [employer] for wound VAC; notified Blossom Hoops with [employer] of need for [facility] discharge tomorrow, per PA. Will need explicit wound care orders for home health agency please.  Referral to [facility] for [facility] for wound care, HHPT, and HHOT; plan start of care for Wed, 5/15, if patient discharges tomorrow.  Patient states she has a friend who can drive her to follow up appts as needed.   Will follow progress.      Expected Discharge Plan: Home w Home Health Services Barriers to Discharge: Continued Medical Work up  Expected Discharge Plan and Services   Discharge Planning Services: CM Consult   Living arrangements for the past 2 months: Apartment                                       Social Determinants of Health (SDOH) Interventions SDOH Screenings   Food Insecurity: No Food Insecurity (05/21/2022)  Housing: Low Risk  (05/21/2022)  Transportation Needs: No Transportation Needs (05/21/2022)  Utilities: Not At Risk (05/21/2022)  Tobacco Use: Medium Risk (05/26/2022)    Readmission Risk Interventions     No data to display        Quintella Baton, RN, BSN  Trauma/Neuro ICU Case Manager [phone]

## 2022-05-26 NOTE — Telephone Encounter (Addendum)
Called [facility], spoke with Judeth Cornfield to remove patient off the surgery board on Weds 05/15-Dr. Dillingham moved up surgery for today 05/13.  Also called [facility], spoke with Meda Klinefelter ZOX#[id number], case is still pending. Requested to amend effecftive date from 05/15 to 05/13 due to Dr. Ulice Bold went ahead and moved surgery to today.  Requested case review to be expedited as well.

## 2022-05-26 NOTE — Anesthesia Postprocedure Evaluation (Signed)
Anesthesia Post Note  Patient: [name] Performed: ARM DEBRIDEMENT WITH MYRIAD PLACEMENT AND CHANGE WOUND VAC (Left: Arm Upper)     Patient location during evaluation: PACU Anesthesia Type: General Level of consciousness: awake and alert, patient cooperative and oriented Pain management: pain level controlled Vital Signs Assessment: post-procedure vital signs reviewed and stable Respiratory status: spontaneous breathing, nonlabored ventilation and respiratory function stable Cardiovascular status: blood pressure returned to baseline and stable Postop Assessment: no apparent nausea or vomiting Anesthetic complications: no   No notable events documented.  Last Vitals:  Vitals:   05/26/22 0907 05/26/22 0923  BP: (!) 142/76 (!) 141/68  Pulse: 70 63  Resp: 12 16  Temp: 36.6 C 36.5 C  SpO2: 100% 100%    Last Pain:  Vitals:   05/26/22 1015  TempSrc:   PainSc: 6                  Dartanyon Frankowski,E. Mckinzie Saksa

## 2022-05-26 NOTE — Interval H&P Note (Signed)
History and Physical Interval Note:  05/26/2022 6:51 AM  April Bowman  has presented today for surgery, with the diagnosis of LEFT ARM DOGBITE.  The various methods of treatment have been discussed with the patient and family. After consideration of risks, benefits and other options for treatment, the patient has consented to  Procedure(s): ARM DEBRIDEMENT WITH MYRIAD PLACEMENT (Left) as a surgical intervention.  The patient's history has been reviewed, patient examined, no change in status, stable for surgery.  I have reviewed the patient's chart and labs.  Questions were answered to the patient's satisfaction.     Alena Bills Sheikh Leverich

## 2022-05-26 NOTE — Telephone Encounter (Signed)
IP admission approved effective 5/8.  Any care since then covered under IP auth

## 2022-05-27 ENCOUNTER — Encounter (HOSPITAL_COMMUNITY): Payer: Self-pay | Admitting: Plastic Surgery

## 2022-05-27 ENCOUNTER — Other Ambulatory Visit (HOSPITAL_COMMUNITY): Payer: Self-pay

## 2022-05-27 DIAGNOSIS — S51851A Open bite of right forearm, initial encounter: Secondary | ICD-10-CM

## 2022-05-27 DIAGNOSIS — S81851A Open bite, right lower leg, initial encounter: Secondary | ICD-10-CM | POA: Diagnosis not present

## 2022-05-27 DIAGNOSIS — S81852A Open bite, left lower leg, initial encounter: Secondary | ICD-10-CM

## 2022-05-27 DIAGNOSIS — S51852A Open bite of left forearm, initial encounter: Secondary | ICD-10-CM | POA: Diagnosis not present

## 2022-05-27 LAB — BASIC METABOLIC PANEL
Anion gap: 15 (ref 5–15)
BUN: 12 mg/dL (ref 8–23)
CO2: 17 mmol/L — ABNORMAL LOW (ref 22–32)
Calcium: 8.4 mg/dL — ABNORMAL LOW (ref 8.9–10.3)
Chloride: 104 mmol/L (ref 98–111)
Creatinine, Ser: 0.69 mg/dL (ref 0.44–1.00)
GFR, Estimated: >60 mL/min (ref >60)
Glucose, Bld: 110 mg/dL — ABNORMAL HIGH (ref 70–99)
Potassium: 5 mmol/L (ref 3.5–5.1)
Sodium: 136 mmol/L (ref 135–145)

## 2022-05-27 LAB — CBC
HCT: 32.3 % — ABNORMAL LOW (ref 36.0–46.0)
Hemoglobin: 10.5 g/dL — ABNORMAL LOW (ref 12.0–15.0)
MCH: 30.8 pg (ref 26.0–34.0)
MCHC: 32.5 g/dL (ref 30.0–36.0)
MCV: 94.7 fL (ref 80.0–100.0)
Platelets: 348 10*3/uL (ref 150–400)
RBC: 3.41 MIL/uL — ABNORMAL LOW (ref 3.87–5.11)
RDW: 13.6 % (ref 11.5–15.5)
WBC: 9 10*3/uL (ref 4.0–10.5)
nRBC: 0 % (ref 0.0–0.2)

## 2022-05-27 MED ORDER — GABAPENTIN 300 MG PO CAPS
300.0000 mg | ORAL_CAPSULE | Freq: Three times a day (TID) | ORAL | 0 refills | Status: DC
  Filled 2022-05-27: qty 90, 30d supply, fill #0

## 2022-05-27 MED ORDER — HYDROXYZINE HCL 25 MG PO TABS
25.0000 mg | ORAL_TABLET | Freq: Three times a day (TID) | ORAL | 0 refills | Status: DC | PRN
  Filled 2022-05-27: qty 30, 10d supply, fill #0

## 2022-05-27 MED ORDER — METHOCARBAMOL 500 MG PO TABS
1000.0000 mg | ORAL_TABLET | Freq: Three times a day (TID) | ORAL | 0 refills | Status: DC
  Filled 2022-05-27: qty 30, 5d supply, fill #0

## 2022-05-27 MED ORDER — ACETAMINOPHEN 500 MG PO TABS: 1000.0000 mg | ORAL_TABLET | Freq: Four times a day (QID) | ORAL | Status: AC | PRN

## 2022-05-27 MED ORDER — OXYCODONE HCL 5 MG PO TABS
5.0000 mg | ORAL_TABLET | ORAL | 0 refills | Status: DC | PRN
  Filled 2022-05-27: qty 30, 5d supply, fill #0

## 2022-05-27 NOTE — Progress Notes (Addendum)
Occupational Therapy Treatment Patient Details Name: April Bowman MRN: [id number] DOB: April 18, 1955 Today's Date: 05/27/2022   History of present illness Pt is a 66 y/o female presenting after being mauled by a dog resulting in multiple bites with most significant being a degloving injury around L elbow. L distal ulna fx.  Underwent I&D of L UE wounds on 5/8 with wound vac placement. 5/13 placement of Myriad powder and wound vac. PMH: B hip replacements, insomnia   OT comments  Interpreter @ [phone] Byrd Hesselbach) used throughout session. Pt able to complete ADL and functional mobility at level safe to DC home with intermittent assistance of family. Pt appears to be mostly concerned about the potential of infection in her L arm and potential surgeries. Contacted plastics regarding any restrictions with L elbow/shoulder ROM - No restrictions regarding wound vac per Dr Ulice Bold Discussed plan of care with care team/PA. Acute OT will continue to follow to facilitate safe DC home.  Pt asking to speak to both of her orthopedic and plastics MDs. PA is aware. Per Dr Ulice Bold, plastics PA will discuss follow up plans with pt. Relayed message to use interpreter per pt request.    Recommendations for follow up therapy are one component of a multi-disciplinary discharge planning process, led by the attending physician.  Recommendations may be updated based on patient status, additional functional criteria and insurance authorization.    Assistance Recommended at Discharge Intermittent Supervision/Assistance  Patient can return home with the following  A little help with bathing/dressing/bathroom;Assistance with cooking/housework   Equipment Recommendations  None recommended by OT    Recommendations for Other Services      Precautions / Restrictions Precautions Precautions: Fall;Other (comment) (wound vac LUE; L ulna fx) Required Braces or Orthoses: Sling;Other Brace (L ulnar gutter  splint) Restrictions Weight Bearing Restrictions: Yes LUE Weight Bearing: Non weight bearing       Mobility Bed Mobility Overal bed mobility: Modified Independent                  Transfers Overall transfer level: Needs assistance     Sit to Stand: Supervision                 Balance     Sitting balance-Leahy Scale: Good       Standing balance-Leahy Scale: Fair                             ADL either performed or assessed with clinical judgement   ADL   Eating/Feeding: Set up   Grooming: Supervision/safety;Set up;Standing   Upper Body Bathing: Minimal assistance   Lower Body Bathing: Min guard;Sit to/from stand   Upper Body Dressing : Minimal assistance;Sitting   Lower Body Dressing: Min guard;Sit to/from stand   Toilet Transfer: Ambulation;Supervision/safety   Toileting- Architect and Hygiene: Supervision/safety;Sit to/from stand       Functional mobility during ADLs: Supervision/safety General ADL Comments: Educated on donning/doffing sling adn managing LB ADL; discussed clothing options; will need further education regarding UB ADL    Extremity/Trunk Assessment Upper Extremity Assessment Upper Extremity Assessment: LUE deficits/detail LUE Deficits / Details: increased edema in IP joints; hand posiitoned in dependent position. Extension appears slightly improved however unable to fully extend MPs; tolerating full AAROM weill through full functional ROM            Vision       Perception     Praxis  Cognition Arousal/Alertness: Awake/alert Behavior During Therapy: WFL for tasks assessed/performed, Anxious Overall Cognitive Status: Within Functional Limits for tasks assessed                                          Exercises Hand Exercises Digit Composite Flexion: AROM, Left, 20 reps Composite Extension: AAROM, Left, 20 reps Digit Composite Abduction: AAROM, Left, 10 reps Digit  Composite Adduction: AAROM, Left, 10 reps Thumb Abduction: PROM, Left, 20 reps Thumb Adduction: Left, 20 reps, AAROM    Shoulder Instructions       General Comments      Pertinent Vitals/ Pain       Pain Assessment Pain Assessment: Faces Faces Pain Scale: Hurts little more Pain Location: LUE Pain Descriptors / Indicators: Guarding, Grimacing Pain Intervention(s): Premedicated before session  Home Living                                          Prior Functioning/Environment              Frequency  Min 3X/week        Progress Toward Goals  OT Goals(current goals can now be found in the care plan section)  Progress towards OT goals: Progressing toward goals  Acute Rehab OT Goals Patient Stated Goal: to talk to her doctors OT Goal Formulation: With patient Time For Goal Achievement: 06/04/22 Potential to Achieve Goals: Good ADL Goals Pt Will Perform Upper Body Dressing: with supervision;sitting Pt Will Perform Lower Body Dressing: with supervision;sit to/from stand Pt Will Transfer to Toilet: with supervision;ambulating Pt/caregiver will Perform Home Exercise Program: Left upper extremity;Independently;With written HEP provided Additional ADL Goal #1: Pt will independently manage L gutter splint Additional ADL Goal #2: Pt will independently manage L spling and verbalize understanding of edema control techniques  Plan Discharge plan needs to be updated    Co-evaluation    PT/OT/SLP Co-Evaluation/Treatment: Yes (partial session)     OT goals addressed during session: ADL's and self-care;Strengthening/ROM      AM-PAC OT "6 Clicks" Daily Activity     Outcome Measure   Help from another person eating meals?: A Little Help from another person taking care of personal grooming?: A Little Help from another person toileting, which includes using toliet, bedpan, or urinal?: A Little Help from another person bathing (including washing, rinsing,  drying)?: A Little Help from another person to put on and taking off regular upper body clothing?: A Little Help from another person to put on and taking off regular lower body clothing?: A Little 6 Click Score: 18    End of Session Equipment Utilized During Treatment: Gait belt  OT Visit Diagnosis: Muscle weakness (generalized) (M62.81);Pain Pain - Right/Left: Left Pain - part of body: Arm   Activity Tolerance Patient tolerated treatment well   Patient Left in chair;with call bell/phone within reach;with chair alarm set   Nurse Communication Mobility status;Other (comment) (need for pt education)        Time: 7846-9629 OT Time Calculation (min): 63 min  Charges: OT General Charges $OT Visit: 1 Visit OT Treatments $Self Care/Home Management : 8-22 mins $Therapeutic Exercise: 8-22 mins  Luisa Dago, OT/L   Acute OT Clinical Specialist Acute Rehabilitation Services Pager [phone] Office [phone]   [facility] 05/27/2022, 11:36 AM

## 2022-05-27 NOTE — H&P (View-Only) (Signed)
1 Day Post-Op  Subjective: Patient is a 67-year-old female with history of dog bite to the left upper extremity.  Patient underwent preparation of left arm wound for placement of myriad powder and sheet as well as placement of wound VAC with Dr. Dillingham yesterday, 05/26/2022.  Patient is postop day 1.  Upon entering the room, patient is sitting in her chair no acute distress.  Interpreting services were used for today's visit.  Patient states she is doing okay since surgery.  Patient reports she is upset with her discharge plan.  Objective: Vital signs in last 24 hours: Temp:  [98 F (36.7 C)] 98 F (36.7 C) (05/14 0401) Pulse Rate:  [71-100] 71 (05/14 0401) Resp:  [17] 17 (05/14 0401) BP: (137-167)/(61-95) 137/69 (05/14 0401) SpO2:  [97 %-100 %] 99 % (05/14 0401) Last BM Date : 05/25/22  Intake/Output from previous day: 05/13 0701 - 05/14 0700 In: 450 [I.V.:450] Out: 1000 [Urine:900; Drains:50; Blood:50] Intake/Output this shift: No intake/output data recorded.  General appearance: alert, cooperative, and no distress Resp: Unlabored breathing, no respiratory distress Extremities: Dressings to left upper extremity are clean dry and intact.  Patient wiggles fingers.  She has good capillary refill.  Sling in place to left upper extremity.  Wound VAC in place and functioning properly.  There is some minimal serosanguineous drainage in the canister.  Lab Results:     Latest Ref Rng & Units 05/27/2022    3:02 AM 05/23/2022    3:37 AM 05/22/2022    3:20 PM  CBC  WBC 4.0 - 10.5 K/uL 9.0  8.5  10.7   Hemoglobin 12.0 - 15.0 g/dL 10.5  9.6  10.4   Hematocrit 36.0 - 46.0 % 32.3  28.6  32.6   Platelets 150 - 400 K/uL 348  224  266     BMET Recent Labs    05/27/22 0302  NA 136  K 5.0  CL 104  CO2 17*  GLUCOSE 110*  BUN 12  CREATININE 0.69  CALCIUM 8.4*   PT/INR No results for input(s): "LABPROT", "INR" in the last 72 hours. ABG No results for input(s): "PHART", "HCO3" in the  last 72 hours.  Invalid input(s): "PCO2", "PO2"  Studies/Results: No results found.  Anti-infectives: Anti-infectives (From admission, onward)    Start     Dose/Rate Route Frequency Ordered Stop   05/26/22 0715  ceFAZolin (ANCEF) IVPB 2g/100 mL premix  Status:  Discontinued        2 g 200 mL/hr over 30 Minutes Intravenous On call to O.R. 05/26/22 0627 05/26/22 0915   05/26/22 0600  ceFAZolin (ANCEF) IVPB 2g/100 mL premix  Status:  Discontinued        2 g 200 mL/hr over 30 Minutes Intravenous On call to O.R. 05/26/22 0246 05/26/22 0915   05/21/22 0600  Ampicillin-Sulbactam (UNASYN) 3 g in sodium chloride 0.9 % 100 mL IVPB        3 g 200 mL/hr over 30 Minutes Intravenous Every 6 hours 05/20/22 2254     05/20/22 2115  piperacillin-tazobactam (ZOSYN) IVPB 3.375 g        3.375 g 100 mL/hr over 30 Minutes Intravenous  Once 05/20/22 2110 05/20/22 2145       Assessment/Plan: s/p Procedure(s): ARM DEBRIDEMENT WITH MYRIAD PLACEMENT AND CHANGE WOUND VAC Plan: -Patient is postop day 1 from irrigation of left arm wound and placement of myriad and wound VAC.  I discussed with the patient that the plan will be for her to   return to the OR next week for another surgery.  I discussed with the patient that she may go home with her wound VAC and we will change it in the OR next week. -Patient is okay to be discharged home with her wound VAC. -I discussed with the patient that if she has any questions or concerns about her wound VAC or her left upper extremity, she can call our clinic. -Plan was discussed with Dr. Dillingham. -Medical management and discharge plan per trauma service.  A total of 45 minutes was spent face-to-face with the patient during this encounter.  We had a long discussion about what the plan would be, expectations and the overall healing process.  All of her questions were answered to her satisfaction.  ADDENDUM: Information sheets with next week's and the following week's  surgery time and location as well as our clinic address and phone number was given to the patient and placed in the patient's chart.     LOS: 6 days    Kawon Willcutt E Catalea Labrecque, PA-C 05/27/2022  

## 2022-05-27 NOTE — Progress Notes (Signed)
Physical Therapy Treatment Patient Details Name: April Bowman MRN: [id number] DOB: 11/22/1955 Today's Date: 05/27/2022   History of Present Illness Pt is a 67 y/o female presenting after being mauled by a dog resulting in multiple bites with most significant being a degloving injury around L elbow. L distal ulna fx.  Underwent I&D of L UE wounds on 5/8 with wound vac placement. 5/13 placement of Myriad powder and wound vac. PMH: B hip replacements, insomnia    PT Comments    Pt seen for PT/OT session with [facility] on campus interpreter services utilized, [facility] interpreter for visit. Pt overall mobilizing well and maintaining NWB of Lt UE for bed mobility and transfers. Pt was able to completed bed mobility at Mod I level and supervision only for sit<>stand with Rt UE for power up and to control lower. Pt reported slight dizziness on first stand but symptoms resolved and did not return with mobility. Pt ambulated functional distance to bathroom with Min Guard and no AD. No LOB and no significant sway observed with gait. Pt able to manage 3/3 toilting tasks. Seated balance good as pt able to don pants sitting on toilet and complete donning in standing. Pt ambulated ~250' with no AD and completed 3 steps with Rt HR to simulate home set up. Following stair mobility pt reports she also has a ramp to enter home if needed. On return to room pt sat EOB and PT/OT engaged pt in conversation regarding discharge plan and potential for return home vs facility. Encouraged pt to discuss dc planning with MD for plastics and ortho to determine surgical plans and expectations for healing timeline. EOS pt ambulated to bathroom and left with OT present to continue session. Will continue to progress as able.   Recommendations for follow up therapy are one component of a multi-disciplinary discharge planning process, led by the attending physician.  Recommendations may be updated based on patient status, additional functional  criteria and insurance authorization.  Follow Up Recommendations  Can patient physically be transported by private vehicle: Yes    Assistance Recommended at Discharge Intermittent Supervision/Assistance  Patient can return home with the following A little help with walking and/or transfers;A little help with bathing/dressing/bathroom;Assistance with cooking/housework;Help with stairs or ramp for entrance;Assist for transportation;Direct supervision/assist for medications management   Equipment Recommendations  None recommended by PT    Recommendations for Other Services       Precautions / Restrictions Precautions Precautions: Fall;Other (comment) (wound vac LUE; L ulna fx) Required Braces or Orthoses: Sling;Other Brace (L ulnar gutter splint) Restrictions Weight Bearing Restrictions: Yes LUE Weight Bearing: Non weight bearing     Mobility  Bed Mobility Overal bed mobility: Modified Independent             General bed mobility comments: HOB elevated    Transfers Overall transfer level: Needs assistance     Sit to Stand: Supervision           General transfer comment: sup for safety with sit<>stand from EOB and toilet    Ambulation/Gait Ambulation/Gait assistance: Min guard, Supervision Gait Distance (Feet): 250 Feet Assistive device: None Gait Pattern/deviations: Step-through pattern, Decreased stride length Gait velocity: fair but reduced     General Gait Details: overall pt steady, no significant sway and no LOB noted. pt able to ambulate with no AD and able to push/manage IV if needed.   Stairs Stairs: Yes Stairs assistance: Min guard Stair Management: One rail Right, Forwards, Alternating pattern Number of Stairs: 3  General stair comments: pt steady with Rt rail for support, ascend/descend with alternating step pattern and no LOB   Wheelchair Mobility    Modified Rankin (Stroke Patients Only)       Balance Overall balance assessment:  Needs assistance Sitting-balance support: Feet supported Sitting balance-Leahy Scale: Good     Standing balance support: No upper extremity supported, During functional activity Standing balance-Leahy Scale: Good                              Cognition Arousal/Alertness: Awake/alert Behavior During Therapy: WFL for tasks assessed/performed, Anxious Overall Cognitive Status: Within Functional Limits for tasks assessed                                          Exercises      General Comments        Pertinent Vitals/Pain Pain Assessment Pain Assessment: Faces Faces Pain Scale: Hurts little more Pain Location: LUE Pain Descriptors / Indicators: Guarding, Grimacing Pain Intervention(s): Limited activity within patient's tolerance, Monitored during session, Premedicated before session, Repositioned    Home Living                          Prior Function            PT Goals (current goals can now be found in the care plan section) Acute Rehab PT Goals Patient Stated Goal: to get moving again PT Goal Formulation: With patient Time For Goal Achievement: 06/04/22 Potential to Achieve Goals: Good Progress towards PT goals: Progressing toward goals    Frequency    Min 3X/week      PT Plan Current plan remains appropriate    Co-evaluation PT/OT/SLP Co-Evaluation/Treatment: Yes Reason for Co-Treatment: To address functional/ADL transfers PT goals addressed during session: Mobility/safety with mobility;Balance;Proper use of DME OT goals addressed during session: ADL's and self-care;Strengthening/ROM      AM-PAC PT "6 Clicks" Mobility   Outcome Measure  Help needed turning from your back to your side while in a flat bed without using bedrails?: None Help needed moving from lying on your back to sitting on the side of a flat bed without using bedrails?: A Little Help needed moving to and from a bed to a chair (including a  wheelchair)?: A Little Help needed standing up from a chair using your arms (e.g., wheelchair or bedside chair)?: A Little Help needed to walk in hospital room?: A Little Help needed climbing 3-5 steps with a railing? : A Little 6 Click Score: 19    End of Session Equipment Utilized During Treatment: Gait belt Activity Tolerance: Patient tolerated treatment well Patient left:  (in bathroom with OT) Nurse Communication: Mobility status PT Visit Diagnosis: Unsteadiness on feet (R26.81);Pain Pain - Right/Left: Left Pain - part of body: Arm     Time: 1610-9604 PT Time Calculation (min) (ACUTE ONLY): 47 min  Charges:  $Gait Training: 8-22 mins $Therapeutic Activity: 8-22 mins                     Wynn Maudlin, DPT Acute Rehabilitation Services Office [phone]  05/27/22 1:16 PM

## 2022-05-27 NOTE — Progress Notes (Signed)
Progress Note  1 Day Post-Op  Subjective: Patient very upset about possible discharge this AM, she is concerned about infection and whether she will be able to manage at home. We discussed that this is why we discussed possibly discharging to SNF yesterday and patient is insistent that she is not doing that. Explained to patient that further procedures by plastic surgery and orthopedic surgery can be done on an outpatient basis. She would like to speak with plastic surgery and orthopedic surgery and requests that they use a translator. I have offered multiple times to use a translator this AM and she has declined multiple times on the basis that she does not want to speak with me. Reiterated to patient that she is not definitively discharging today but that if her plan is to go home rather than to go to SNF that she is medically appropriate for discharge and that we need to be working towards that. I have reached out to PT/OT for updated recommendations as well and ortho and plastics this morning.   Objective: Vital signs in last 24 hours: Temp:  [97.7 F (36.5 C)-98 F (36.7 C)] 98 F (36.7 C) (05/14 0401) Pulse Rate:  [63-100] 71 (05/14 0401) Resp:  [12-20] 17 (05/14 0401) BP: (128-167)/(61-95) 137/69 (05/14 0401) SpO2:  [97 %-100 %] 99 % (05/14 0401) Last BM Date : 05/25/22  Intake/Output from previous day: 05/13 0701 - 05/14 0700 In: 450 [I.V.:450] Out: 1000 [Urine:900; Drains:50; Blood:50] Intake/Output this shift: No intake/output data recorded.  PE: General: WD, WN female who is sitting bed in NAD Lungs: Respiratory effort nonlabored MS: splint to LUE and VAC present with SS drainage, L fingers NVI; RUE ROM grossly intact and R hand NVI Psych: A&Ox3 with an appropriate affect.    Lab Results:  Recent Labs    05/27/22 0302  WBC 9.0  HGB 10.5*  HCT 32.3*  PLT 348   BMET Recent Labs    05/27/22 0302  NA 136  K 5.0  CL 104  CO2 17*  GLUCOSE 110*  BUN 12   CREATININE 0.69  CALCIUM 8.4*   PT/INR No results for input(s): "LABPROT", "INR" in the last 72 hours. CMP     Component Value Date/Time   NA 136 05/27/2022 0302   K 5.0 05/27/2022 0302   CL 104 05/27/2022 0302   CO2 17 (L) 05/27/2022 0302   GLUCOSE 110 (H) 05/27/2022 0302   BUN 12 05/27/2022 0302   CREATININE 0.69 05/27/2022 0302   CALCIUM 8.4 (L) 05/27/2022 0302   PROT 6.6 05/20/2022 2101   ALBUMIN 3.6 05/20/2022 2101   AST 29 05/20/2022 2101   ALT 19 05/20/2022 2101   ALKPHOS 87 05/20/2022 2101   BILITOT 0.4 05/20/2022 2101   GFRNONAA >60 05/27/2022 0302   Lipase     Component Value Date/Time   LIPASE 26 09/03/2021 0754       Studies/Results: No results found.  Anti-infectives: Anti-infectives (From admission, onward)    Start     Dose/Rate Route Frequency Ordered Stop   05/26/22 0715  ceFAZolin (ANCEF) IVPB 2g/100 mL premix  Status:  Discontinued        2 g 200 mL/hr over 30 Minutes Intravenous On call to O.R. 05/26/22 4098 05/26/22 0915   05/26/22 0600  ceFAZolin (ANCEF) IVPB 2g/100 mL premix  Status:  Discontinued        2 g 200 mL/hr over 30 Minutes Intravenous On call to O.R. 05/26/22 0246 05/26/22 0915  05/21/22 0600  Ampicillin-Sulbactam (UNASYN) 3 g in sodium chloride 0.9 % 100 mL IVPB        3 g 200 mL/hr over 30 Minutes Intravenous Every 6 hours 05/20/22 2254     05/20/22 2115  piperacillin-tazobactam (ZOSYN) IVPB 3.375 g        3.375 g 100 mL/hr over 30 Minutes Intravenous  Once 05/20/22 2110 05/20/22 2145        Assessment/Plan  Dog attack  LUE degloving injury w/ L Ulnar fracture - per ortho, s/p I&D 5/8 by Dr. Kerry Fort . Plastics consulted and patient taken to the OR 5/13 for myriad/VAC, they plan to change VAC in OR again next week. Per ortho: "likely that her inability to extend the index finger is due to loss of EDC muscle as part of the soft tissue defect or may be a neuropraxia only affecting the motor fibers but this is less  likely given the intact sensation". OT for "removable short arm brace that immobilizes wrist and ulnar forearm to facilitate wound vac changes". Ortho ok with outpatient follow up at this time.  RUE avulsion injury - Per Ortho, s/p I&D by Dr. Kerry Fort. Daily dressing changes BLE avulsion injuries - daily dressing changes  Acute stress reaction - appreciate psych consult, seroquel BID, hydroxyzine TID prn  ABL anemia - hgb stable, 10.5 today from 9.6 5/10    Animal Control contacted 5/8 and confirmed that the dog that bit patient was UTD on vaccines.     FEN: Reg diet, SLIV, BID colace and Miralax VTE: LMWH ID: [email or web address] 5/8>> (per ortho) today is day 6   Dispo: PT/OT, patient requesting to speak to plastic surgery and orthopedic surgery. Patient medically stable for discharge but will await updated recommendations.   LOS: 6 days   I reviewed Consultant ortho and plastics notes, last 24 h vitals and pain scores, last 48 h intake and output, last 24 h labs and trends, and spoken with ortho, plastics and PT/OT .     Juliet Rude, [facility] Surgery 05/27/2022, 8:18 AM Please see Amion for pager number during day hours 7:00am-4:30pm

## 2022-05-27 NOTE — Progress Notes (Signed)
Patient expressed her concerns regarding being discharged to home possibly today 05/27/22.  Patient stated she is not ready and that she would like to speak with the doctor today regarding her concerns.

## 2022-05-27 NOTE — TOC Progression Note (Signed)
Transition of Care [facility]) - Progression Note    Patient Details  Name: Alberta Schaffert MRN: [id number] Date of Birth: Jul 05, 1955  Transition of Care [facility]) CM/SW Contact  Glennon Mac, RN Phone Number: 05/27/2022, 4:30pm  Clinical Narrative:    Met with patient and her boyfriend at bedside. Patient states she is reluctantly ready for discharge tomorrow, but feels "anxious." Reviewed discharge arrangements including home VAC, which will be delivered in AM,and home health services with [facility].  Encouraged boyfriend to provide as much assistance as possible at home, and he states he will. Faxed daughter's FMLA forms to her employer, and originals left in patient room for her to pick up.      Expected Discharge Plan: Home w Home Health Services Barriers to Discharge: Continued Medical Work up  Expected Discharge Plan and Services   Discharge Planning Services: CM Consult   Living arrangements for the past 2 months: Apartment                                       Social Determinants of Health (SDOH) Interventions SDOH Screenings   Food Insecurity: No Food Insecurity (05/21/2022)  Housing: Low Risk  (05/21/2022)  Transportation Needs: No Transportation Needs (05/21/2022)  Utilities: Not At Risk (05/21/2022)  Tobacco Use: Medium Risk (05/27/2022)    Readmission Risk Interventions     No data to display         Quintella Baton, RN, BSN  Trauma/Neuro ICU Case Manager [phone]

## 2022-05-27 NOTE — Progress Notes (Addendum)
1 Day Post-Op  Subjective: Patient is a 67 year old female with history of dog bite to the left upper extremity.  Patient underwent preparation of left arm wound for placement of myriad powder and sheet as well as placement of wound VAC with Dr. Ulice Bold yesterday, 05/26/2022.  Patient is postop day 1.  Upon entering the room, patient is sitting in her chair no acute distress.  Interpreting services were used for today's visit.  Patient states she is doing okay since surgery.  Patient reports she is upset with her discharge plan.  Objective: Vital signs in last 24 hours: Temp:  [98 F (36.7 C)] 98 F (36.7 C) (05/14 0401) Pulse Rate:  [71-100] 71 (05/14 0401) Resp:  [17] 17 (05/14 0401) BP: (137-167)/(61-95) 137/69 (05/14 0401) SpO2:  [97 %-100 %] 99 % (05/14 0401) Last BM Date : 05/25/22  Intake/Output from previous day: 05/13 0701 - 05/14 0700 In: 450 [I.V.:450] Out: 1000 [Urine:900; Drains:50; Blood:50] Intake/Output this shift: No intake/output data recorded.  General appearance: alert, cooperative, and no distress Resp: Unlabored breathing, no respiratory distress Extremities: Dressings to left upper extremity are clean dry and intact.  Patient wiggles fingers.  She has good capillary refill.  Sling in place to left upper extremity.  Wound VAC in place and functioning properly.  There is some minimal serosanguineous drainage in the canister.  Lab Results:     Latest Ref Rng & Units 05/27/2022    3:02 AM 05/23/2022    3:37 AM 05/22/2022    3:20 PM  CBC  WBC 4.0 - 10.5 K/uL 9.0  8.5  10.7   Hemoglobin 12.0 - 15.0 g/dL 16.1  9.6  09.6   Hematocrit 36.0 - 46.0 % 32.3  28.6  32.6   Platelets 150 - 400 K/uL 348  224  266     BMET Recent Labs    05/27/22 0302  NA 136  K 5.0  CL 104  CO2 17*  GLUCOSE 110*  BUN 12  CREATININE 0.69  CALCIUM 8.4*   PT/INR No results for input(s): "LABPROT", "INR" in the last 72 hours. ABG No results for input(s): "PHART", "HCO3" in the  last 72 hours.  Invalid input(s): "PCO2", "PO2"  Studies/Results: No results found.  Anti-infectives: Anti-infectives (From admission, onward)    Start     Dose/Rate Route Frequency Ordered Stop   05/26/22 0715  ceFAZolin (ANCEF) IVPB 2g/100 mL premix  Status:  Discontinued        2 g 200 mL/hr over 30 Minutes Intravenous On call to O.R. 05/26/22 0454 05/26/22 0915   05/26/22 0600  ceFAZolin (ANCEF) IVPB 2g/100 mL premix  Status:  Discontinued        2 g 200 mL/hr over 30 Minutes Intravenous On call to O.R. 05/26/22 0246 05/26/22 0915   05/21/22 0600  Ampicillin-Sulbactam (UNASYN) 3 g in sodium chloride 0.9 % 100 mL IVPB        3 g 200 mL/hr over 30 Minutes Intravenous Every 6 hours 05/20/22 2254     05/20/22 2115  piperacillin-tazobactam (ZOSYN) IVPB 3.375 g        3.375 g 100 mL/hr over 30 Minutes Intravenous  Once 05/20/22 2110 05/20/22 2145       Assessment/Plan: s/p Procedure(s): ARM DEBRIDEMENT WITH MYRIAD PLACEMENT AND CHANGE WOUND VAC Plan: -Patient is postop day 1 from irrigation of left arm wound and placement of myriad and wound VAC.  I discussed with the patient that the plan will be for her to  return to the OR next week for another surgery.  I discussed with the patient that she may go home with her wound VAC and we will change it in the OR next week. -Patient is okay to be discharged home with her wound VAC. -I discussed with the patient that if she has any questions or concerns about her wound VAC or her left upper extremity, she can call our clinic. -Plan was discussed with Dr. Ulice Bold. -Medical management and discharge plan per trauma service.  A total of 45 minutes was spent face-to-face with the patient during this encounter.  We had a long discussion about what the plan would be, expectations and the overall healing process.  All of her questions were answered to her satisfaction.  ADDENDUM: Information sheets with next week's and the following week's  surgery time and location as well as our clinic address and phone number was given to the patient and placed in the patient's chart.     LOS: 6 days    Laurena Spies, PA-C 05/27/2022

## 2022-05-27 NOTE — Care Management Important Message (Signed)
Important Message  Patient Details  Name: April Bowman MRN: [id number] Date of Birth: [birth year]   Medicare Important Message Given:  Yes     [name] 05/27/2022, 3:38 PM

## 2022-05-28 ENCOUNTER — Other Ambulatory Visit: Payer: Self-pay

## 2022-05-28 ENCOUNTER — Encounter (HOSPITAL_BASED_OUTPATIENT_CLINIC_OR_DEPARTMENT_OTHER): Payer: Self-pay | Admitting: Plastic Surgery

## 2022-05-28 ENCOUNTER — Other Ambulatory Visit (HOSPITAL_COMMUNITY): Payer: Self-pay

## 2022-05-28 SURGERY — IRRIGATION AND DEBRIDEMENT EXTREMITY
Anesthesia: Choice | Site: Arm Upper | Laterality: Left

## 2022-05-28 NOTE — Discharge Summary (Signed)
Physician Discharge Summary  Patient ID: April Bowman MRN: [id number] DOB/AGE: 1955/09/26 67 y.o.  Admit date: 05/20/2022 Discharge date: 05/28/2022  Discharge Diagnoses Dog attack/bites Left ulna fracture with LUE degloving injury RUE avulsion injury BLE avulsion injury Acute stress reaction ABL anemia, stable  Consultants Hand surgery  Plastic surgery  Psychiatry  Procedures Irrigation and debridement of soft tissue defects of left arm and distal ulna and irrigation debridement of right forearm wounds - 05/21/22 Dr. Shaune Pollack  Preparation of Left arm wound 9 x 13 cm for placement of Myriad powder 2 gm and sheet 10 x 10 cm, placement of VAC - 05/26/22 Dr. Foster Simpson   HPI: Patient is a 67 year old who presented as a level 2 trauma alert this evening after being mauled by an approximately 60 pound pitbull.  She reported that she was letting her 11 pound dog out in the yard when this dog, which belongs to a neighbor, came into her yard to attempt to attack her smaller dog.  She was able to get her dog back inside but then the pitbull attacked her. She did fall to her knees and was dragged somewhat around the yard before bystanders were able to get the dog away from her. She had significant pain.  Noted to have essentially degloving injury to her left upper extremity as well as multiple puncture wounds on other extremities. She was admitted to the trauma service, found to have a left ulna fracture also.  Hospital Course: Hand surgery consulted and took patient to the OR 5/8 as listed above for I&D. Plastic surgery consulted by hand surgery for significant soft tissue injury of LUE. Animal control was contacted 5/8 and vaccination status of the dog that attacked her was verified. Psychiatry consulted for acute stress reaction. Hgb drifted down to 9.1 5/9 but stabilized 5/10 at 9.6. Plastic surgery took patient to the OR 5/13 as listed above for treatment of LUE soft tissue  injury. Patient was evaluate by therapies throughout admission and initial recommendations were for SNF, patient declined this so recommendations updated to [facility] PT/OT. HH RN was also arranged. Patient discharged 05/28/22 in stable condition with plans for return to OR with plastic surgery next week. Hand surgery is planning on coordinating with plastic surgery for definitive fixation of left ulna, and recommended removable splint in the interim. Splint was applied by OT.   PE: General: WD, WN female who is sitting bed in NAD Lungs: Respiratory effort nonlabored MS: splint to LUE and VAC present with SS drainage, L fingers WWP; RUE ROM grossly intact and R hand NVI, RUE dressing C/D/I, R ankle dressing C/D/I, left knee wounds scabbed without signs of infection  Psych: A&Ox3 with an appropriate affect.   I or a member of my team have reviewed this patient in the Controlled Substance Database   Allergies as of 05/28/2022   No Known Allergies      Medication List     TAKE these medications    acetaminophen 500 MG tablet Commonly known as: TYLENOL Take 2 tablets (1,000 mg total) by mouth every 6 (six) hours as needed for mild pain or fever.   ASCORBIC ACID PO Take 1 tablet by mouth daily. Vitamin C, unknown strength.   diclofenac 75 MG EC tablet Commonly known as: VOLTAREN Take 75 mg by mouth daily as needed for moderate pain.   ESTRACE VAGINAL 0.1 MG/GM vaginal cream Generic drug: estradiol Place 1 g vaginally at bedtime.   gabapentin 300 MG capsule Commonly  known as: NEURONTIN Take 1 capsule (300 mg total) by mouth 3 (three) times daily.   hydrOXYzine 25 MG tablet Commonly known as: ATARAX Take 1 tablet (25 mg total) by mouth 3 (three) times daily as needed for anxiety.   ibuprofen 800 MG tablet Commonly known as: ADVIL Take 800 mg by mouth 3 (three) times daily as needed.   MAGNESIUM OXIDE PO Take 1 tablet by mouth daily.   methocarbamol 500 MG tablet Commonly known as:  ROBAXIN Take 2 tablets (1,000 mg total) by mouth 3 (three) times daily.   MORINGA OLEIFERA PO Take 1 capsule by mouth daily.   OVER THE COUNTER MEDICATION Take 1 tablet by mouth daily. Vitamin A + Vitamin D3 combination supplement.   oxyCODONE 5 MG immediate release tablet Commonly known as: Oxy IR/ROXICODONE Take 1-2 tablets (5-10 mg total) by mouth every 4 (four) hours as needed for moderate pain or severe pain.   POTASSIUM GLUCONATE PO Take 1 tablet by mouth daily.   QUEtiapine 100 MG tablet Commonly known as: SEROQUEL Take 100 mg by mouth at bedtime.   tiZANidine 4 MG capsule Commonly known as: ZANAFLEX Take 4 mg by mouth every 6 (six) hours as needed for muscle spasms.   traZODone 50 MG tablet Commonly known as: DESYREL Take 50 mg by mouth at bedtime as needed for sleep.   VITAMIN B-12 PO Take 1 tablet by mouth daily.   VITAMIN D-3 PO Take 1 capsule by mouth daily.   VITAMIN E PO Take 1 capsule by mouth daily.               Durable Medical Equipment  (From admission, onward)           Start     Ordered   05/26/22 1618  For home use only DME Negative pressure wound device  Once       Comments: Please email docusign for order to [email or web address]  Question Answer Comment  Frequency of dressing change 2 times per week   Length of need 3 Months   Dressing type Foam   Amount of suction 125 mm/Hg   Pressure application Continuous pressure   Supplies 10 canisters and 15 dressings per month for duration of therapy      05/26/22 1618              Follow-up Information     Dillingham, Alena Bills, DO Follow up in 2 week(s).   Specialty: Plastic Surgery Contact information: [address] [place] [address] [phone]         [facility] Follow up.   Specialty: Home Health Services Why: Home health agency will call you to arrange home health nursing, physical and occupational therapy  appts. Contact information: [address] [place] [address] [phone]         Ramon Dredge, MD. Call today.   Specialties: Orthopedic Surgery, Hand Surgery Why: To follow up for left ulna fracture Contact information: [address] [place] [address] [phone]         [facility]. Call.   Why: As needed with questions or concerns, no follow up scheduled at this time Contact information: [address] [place] [address] [phone]                Signed: Juliet Rude , [facility] Surgery 05/28/2022, 9:06 AM Please see Amion for pager number during  day hours 7:00am-4:30pm

## 2022-05-28 NOTE — Progress Notes (Signed)
Physical Therapy Treatment Patient Details Name: April Bowman MRN: [id number] DOB: 1955-02-21 Today's Date: 05/28/2022   History of Present Illness Pt is a 67 y/o female presenting after being mauled by a dog resulting in multiple bites with most significant being a degloving injury around L elbow. L distal ulna fx.  Underwent I&D of L UE wounds on 5/8 with wound vac placement. 5/13 placement of Myriad powder and wound vac. PMH: B hip replacements, insomnia    PT Comments    Patient seated on edge of bed on arrival to room. Patient able to don sling with set-up and occasional cues for positioning of hand/wrist. Reinforcement of importance to maintain NWB with LUE with transfers which patient was able to maintain without difficulty. She declined ambulation and reports her left arm is her main concern. Anticipate the need for intermittent supervision/assistance at discharge. PT will continue to follow to maximize independence and decrease caregiver burden.    Recommendations for follow up therapy are one component of a multi-disciplinary discharge planning process, led by the attending physician.  Recommendations may be updated based on patient status, additional functional criteria and insurance authorization.  Follow Up Recommendations  Can patient physically be transported by private vehicle: Yes    Assistance Recommended at Discharge Intermittent Supervision/Assistance  Patient can return home with the following A little help with walking and/or transfers;A little help with bathing/dressing/bathroom;Assistance with cooking/housework;Help with stairs or ramp for entrance;Assist for transportation;Direct supervision/assist for medications management   Equipment Recommendations  None recommended by PT    Recommendations for Other Services       Precautions / Restrictions Precautions Precautions: Fall;Other (comment) (wound vac) Required Braces or Orthoses: Sling;Other Brace (L ulnar  splint) Restrictions Weight Bearing Restrictions: Yes LUE Weight Bearing: Non weight bearing     Mobility  Bed Mobility               General bed mobility comments: not assessed as patient sitting up on arrival and post session    Transfers Overall transfer level: Needs assistance Equipment used: None Transfers: Bed to chair/wheelchair/BSC, Sit to/from Stand Sit to Stand: Modified independent (Device/Increase time)   Step pivot transfers: Supervision       General transfer comment: reinforcement to maintain NWB of LUE with transfers with carry over demonstrated. supervision provided with bed side commode to bed transfer as patient reports feeling mildly lightheaded due to recent pain medication. no physical assistance is required    Ambulation/Gait               General Gait Details: patient declined   Social research officer, government [employer] (Stroke Patients Only)       Balance Overall balance assessment: Needs assistance Sitting-balance support: Feet supported Sitting balance-Leahy Scale: Good     Standing balance support: No upper extremity supported, During functional activity Standing balance-Leahy Scale: Good                              Cognition Arousal/Alertness: Awake/alert Behavior During Therapy: WFL for tasks assessed/performed, Anxious Overall Cognitive Status: Within Functional Limits for tasks assessed                                 General Comments: Patient seen with in person Spanish interpreter to maximize understanding of  education        Exercises      General Comments General comments (skin integrity, edema, etc.): patient donned the sling with set-up and occasional cues for positioning. encouraged patient to have arm elevated on pillow when sling is not in place for edema mangaement      Pertinent Vitals/Pain Pain Assessment Pain Assessment: Faces Faces Pain Scale:  Hurts a little bit Pain Location: LUE Pain Descriptors / Indicators: Sore Pain Intervention(s): Limited activity within patient's tolerance, Monitored during session, Repositioned    Home Living                          Prior Function            PT Goals (current goals can now be found in the care plan section) Acute Rehab PT Goals Patient Stated Goal: improvement in left arm PT Goal Formulation: With patient Time For Goal Achievement: 06/04/22 Potential to Achieve Goals: Good Progress towards PT goals: Progressing toward goals    Frequency    Min 3X/week      PT Plan Current plan remains appropriate    Co-evaluation              AM-PAC PT "6 Clicks" Mobility   Outcome Measure  Help needed turning from your back to your side while in a flat bed without using bedrails?: None Help needed moving from lying on your back to sitting on the side of a flat bed without using bedrails?: A Little Help needed moving to and from a bed to a chair (including a wheelchair)?: A Little Help needed standing up from a chair using your arms (e.g., wheelchair or bedside chair)?: A Little Help needed to walk in hospital room?: A Little Help needed climbing 3-5 steps with a railing? : A Little 6 Click Score: 19    End of Session   Activity Tolerance: Patient tolerated treatment well Patient left:  (seated on edge of bed, OT entering room, interpreter in the room) Nurse Communication: Mobility status PT Visit Diagnosis: Unsteadiness on feet (R26.81);Pain Pain - Right/Left: Left Pain - part of body: Arm     Time: 1030-1048 PT Time Calculation (min) (ACUTE ONLY): 18 min  Charges:  $Therapeutic Activity: 8-22 mins                     Donna Bernard, PT, MPT    Ina Homes 05/28/2022, 11:56 AM

## 2022-05-28 NOTE — Progress Notes (Signed)
Occupational Therapy Treatment Patient Details Name: April Bowman MRN: [id number] DOB: [birth year] Today's Date: 05/28/2022   History of present illness Pt is a 68 y/o female presenting after being mauled by a dog resulting in multiple bites with most significant being a degloving injury around L elbow. L distal ulna fx.  Underwent I&D of L UE wounds on 5/8 with wound vac placement. 5/13 placement of Myriad powder and wound vac. PMH: B hip replacements, insomnia (Simultaneous filing. User may not have seen previous data.)   OT comments  Pt with concerns regarding follow up orthopedic intervention in arrival. PA contacted reports this has been discussed multiple times but pt continues to feel uneasy being unsure about plan. Requested RN to facilitate education regarding follow up. Interpreter helpful serving to ensure clear communication between all disciplines.   Reviewed UB dressing with pt donning paper scrub top with set-up A, and donning sling with min A for initial threading. Pt with good recall of education previously provided regarding digit ROM, positioning, and LB ADL. Pt with concerns for being able to tighten splint. Velcro straps tightened and educated on positioning to achieve best fit as well as asking for assist after each dressing change. Will continue to follow.    Recommendations for follow up therapy are one component of a multi-disciplinary discharge planning process, led by the attending physician.  Recommendations may be updated based on patient status, additional functional criteria and insurance authorization.    Assistance Recommended at Discharge Intermittent Supervision/Assistance  Patient can return home with the following  A little help with bathing/dressing/bathroom;Assistance with cooking/housework   Equipment Recommendations  None recommended by OT    Recommendations for Other Services      Precautions / Restrictions Precautions Precautions: Fall;Other  (comment) (Wound vac LUE, L ulnar fx  Simultaneous filing. User may not have seen previous data.) Precaution Comments: L UE wound vac Required Braces or Orthoses: Sling;Other Brace (L ulnar gutter splint  Simultaneous filing. User may not have seen previous data.) Restrictions Weight Bearing Restrictions: Yes (Simultaneous filing. User may not have seen previous data.) LUE Weight Bearing: Non weight bearing (Simultaneous filing. User may not have seen previous data.)       Mobility Bed Mobility               General bed mobility comments: EOB on arrival (Simultaneous filing. User may not have seen previous data.)    Transfers Overall transfer level: Needs assistance (Simultaneous filing. User may not have seen previous data.) Equipment used: None (Simultaneous filing. User may not have seen previous data.) Transfers: Sit to/from Stand (Simultaneous filing. User may not have seen previous data.) Sit to Stand: Supervision (Simultaneous filing. User may not have seen previous data.)           General transfer comment: supervision for safety (Simultaneous filing. User may not have seen previous data.)     Balance Overall balance assessment: Needs assistance (Simultaneous filing. User may not have seen previous data.) Sitting-balance support: Feet supported (Simultaneous filing. User may not have seen previous data.) Sitting balance-Leahy Scale: Good (Simultaneous filing. User may not have seen previous data.)     Standing balance support: No upper extremity supported, During functional activity (Simultaneous filing. User may not have seen previous data.) Standing balance-Leahy Scale: Good (Simultaneous filing. User may not have seen previous data.)  ADL either performed or assessed with clinical judgement   ADL Overall ADL's : Needs assistance/impaired                 Upper Body Dressing : Minimal assistance;Sitting Upper Body  Dressing Details (indicate cue type and reason): Light assist for sling management. ABle to don shirt without hands on assist                 Functional mobility during ADLs: Supervision/safety General ADL Comments: Reviewed UB AD this session and pt very receptive. Practicing with paper scrub toip    Extremity/Trunk Assessment Upper Extremity Assessment Upper Extremity Assessment: LUE deficits/detail LUE Deficits / Details: Continues with mild edema in IP J. Extension slightly improved, but limited at MP LUE Coordination: decreased fine motor;decreased gross motor   Lower Extremity Assessment Lower Extremity Assessment: Defer to PT evaluation        Vision   Vision Assessment?: No apparent visual deficits   Perception     Praxis      Cognition Arousal/Alertness: Awake/alert (Simultaneous filing. User may not have seen previous data.) Behavior During Therapy: [facility] for tasks assessed/performed, Anxious (Simultaneous filing. User may not have seen previous data.) Overall Cognitive Status: Within Functional Limits for tasks assessed (Simultaneous filing. User may not have seen previous data.)                                 General Comments: Pt with good insight into current abilities. (Simultaneous filing. User may not have seen previous data.)        Exercises Exercises: Hand exercises Hand Exercises Digit Composite Flexion: AROM, Left, 20 reps Composite Extension: AAROM, Left, 20 reps Digit Composite Abduction: AAROM, Left, 10 reps Digit Composite Adduction: AAROM, Left, 10 reps Digit Lifts: PROM Thumb Abduction: PROM, Left, 20 reps Thumb Adduction: Left, 20 reps, AAROM    Shoulder Instructions       General Comments Pt with max concerns regarding orthopaedic plans for discharge and follow up suergeries on arrival. Messaged PA [employer] who responded that she was aware and that follow up surgeries and plans have been discussed with pt,  plastics, and ortho several times. Pt does not want to leave without surgery scheduled for orthopedic intervention of her ulna. In person Spanish interpreter present Raquel and very helpful to ensure all information was translated and received clearly.    Pertinent Vitals/ Pain       Pain Assessment Pain Assessment: Faces (Simultaneous filing. User may not have seen previous data.) Faces Pain Scale: Hurts little more (Simultaneous filing. User may not have seen previous data.) Pain Location: LUE (Simultaneous filing. User may not have seen previous data.) Pain Descriptors / Indicators: Guarding, Grimacing (Simultaneous filing. User may not have seen previous data.) Pain Intervention(s): Limited activity within patient's tolerance, Monitored during session (Simultaneous filing. User may not have seen previous data.)  Home Living                                          Prior Functioning/Environment              Frequency  Min 3X/week        Progress Toward Goals  OT Goals(current goals can now be found in the care plan section)  Progress towards OT goals: Progressing toward goals  Acute Rehab OT Goals Patient Stated Goal: to talk to her doctors OT Goal Formulation: With patient Time For Goal Achievement: 06/04/22 Potential to Achieve Goals: Good ADL Goals Pt Will Perform Upper Body Dressing: with supervision;sitting Pt Will Perform Lower Body Dressing: with supervision;sit to/from stand Pt Will Transfer to Toilet: with supervision;ambulating Pt/caregiver will Perform Home Exercise Program: Left upper extremity;Independently;With written HEP provided Additional ADL Goal #1: Pt will independently manage L gutter splint Additional ADL Goal #2: Pt will independently manage L spling and verbalize understanding of edema control techniques  Plan Discharge plan needs to be updated    Co-evaluation                 AM-PAC OT "6 Clicks" Daily Activity      Outcome Measure   Help from another person eating meals?: A Little Help from another person taking care of personal grooming?: A Little Help from another person toileting, which includes using toliet, bedpan, or urinal?: A Little Help from another person bathing (including washing, rinsing, drying)?: A Little Help from another person to put on and taking off regular upper body clothing?: A Little Help from another person to put on and taking off regular lower body clothing?: A Little 6 Click Score: 18    End of Session Equipment Utilized During Treatment: Other (comment) (sling)  OT Visit Diagnosis: Muscle weakness (generalized) (M62.81);Pain Pain - Right/Left: Left Pain - part of body: Arm   Activity Tolerance Patient tolerated treatment well   Patient Left with call bell/phone within reach;in bed;with nursing/sitter in room;Other (comment) (interpreter still present)   Nurse Communication Mobility status (need for pt education, discussion regarding f/u plans for orthopedics, plastics, wound care)        Time: 1610-9604 OT Time Calculation (min): 44 min  Charges: OT General Charges $OT Visit: 1 Visit OT Treatments $Self Care/Home Management : 23-37 mins $Therapeutic Exercise: 8-22 mins  Myrla Halsted, OTD, OTR/L [facility] Acute Rehabilitation Office: [phone]   Myrla Halsted 05/28/2022, 12:03 PM

## 2022-05-28 NOTE — TOC Transition Note (Addendum)
Transition of Care [facility]) - CM/SW Discharge Note   Patient Details  Name: April Bowman MRN: [id number] Date of Birth: December 14, 1955  [facility] CM/SW Contact:  Epifanio Lesches, RN Phone Number: 05/28/2022, 10:46 AM   Clinical Narrative:    Patient will DC to: home Anticipated DC date: 05/28/2022 Family notified: yes, boyfriend Transport by:   Per MD patient ready for DC today. RN, patient, patient's family, and [facility]  notified of DC. Pt will d/c home with wound vac. Awaiting  POD form for pt to sign. Once received wound vac will be delivery to pt @ bedside.  Boyfriend to assist with care once d/c. Pt without RX med concerns.  Post hospital f/u noted on AVS.  Boyfriend to provide transportation to home.  05/28/2022 1200 - Pt signed wound vac POD from and wound vac left @ bedside with pt to go home with.   RNCM will sign off for now as intervention is no longer needed. Please consult [place] again if new needs arise.   Final next level of care: Home w Home Health Services Barriers to Discharge: No Barriers Identified   Patient Goals and CMS Choice      Discharge Placement                         Discharge Plan and Services Additional resources added to the After Visit Summary for     Discharge Planning Services: CM Consult                                 Social Determinants of Health (SDOH) Interventions SDOH Screenings   Food Insecurity: No Food Insecurity (05/21/2022)  Housing: Low Risk  (05/21/2022)  Transportation Needs: No Transportation Needs (05/21/2022)  Utilities: Not At Risk (05/21/2022)  Tobacco Use: Medium Risk (05/27/2022)     Readmission Risk Interventions     No data to display

## 2022-05-28 NOTE — TOC Transition Note (Signed)
TOC discharge medications READY and AWAITING pt discharge in TOC PHARMACY LOCATED ON 2ND FLOOR.

## 2022-05-29 DIAGNOSIS — Z87891 Personal history of nicotine dependence: Secondary | ICD-10-CM | POA: Diagnosis not present

## 2022-05-29 DIAGNOSIS — Z9981 Dependence on supplemental oxygen: Secondary | ICD-10-CM | POA: Diagnosis not present

## 2022-05-29 DIAGNOSIS — S91011D Laceration without foreign body, right ankle, subsequent encounter: Secondary | ICD-10-CM | POA: Diagnosis not present

## 2022-05-29 DIAGNOSIS — S81012D Laceration without foreign body, left knee, subsequent encounter: Secondary | ICD-10-CM | POA: Diagnosis not present

## 2022-05-29 DIAGNOSIS — G4700 Insomnia, unspecified: Secondary | ICD-10-CM | POA: Diagnosis not present

## 2022-05-29 DIAGNOSIS — W540XXD Bitten by dog, subsequent encounter: Secondary | ICD-10-CM | POA: Diagnosis not present

## 2022-05-29 DIAGNOSIS — S51811D Laceration without foreign body of right forearm, subsequent encounter: Secondary | ICD-10-CM | POA: Diagnosis not present

## 2022-05-29 DIAGNOSIS — S51852D Open bite of left forearm, subsequent encounter: Secondary | ICD-10-CM | POA: Diagnosis not present

## 2022-06-02 DIAGNOSIS — Z9981 Dependence on supplemental oxygen: Secondary | ICD-10-CM | POA: Diagnosis not present

## 2022-06-02 DIAGNOSIS — S81012D Laceration without foreign body, left knee, subsequent encounter: Secondary | ICD-10-CM | POA: Diagnosis not present

## 2022-06-02 DIAGNOSIS — S51852D Open bite of left forearm, subsequent encounter: Secondary | ICD-10-CM | POA: Diagnosis not present

## 2022-06-02 DIAGNOSIS — G4700 Insomnia, unspecified: Secondary | ICD-10-CM | POA: Diagnosis not present

## 2022-06-02 DIAGNOSIS — W540XXD Bitten by dog, subsequent encounter: Secondary | ICD-10-CM | POA: Diagnosis not present

## 2022-06-02 DIAGNOSIS — S91011D Laceration without foreign body, right ankle, subsequent encounter: Secondary | ICD-10-CM | POA: Diagnosis not present

## 2022-06-02 DIAGNOSIS — S51811D Laceration without foreign body of right forearm, subsequent encounter: Secondary | ICD-10-CM | POA: Diagnosis not present

## 2022-06-02 DIAGNOSIS — Z87891 Personal history of nicotine dependence: Secondary | ICD-10-CM | POA: Diagnosis not present

## 2022-06-04 ENCOUNTER — Ambulatory Visit (HOSPITAL_BASED_OUTPATIENT_CLINIC_OR_DEPARTMENT_OTHER): Payer: Medicare HMO | Admitting: Anesthesiology

## 2022-06-04 ENCOUNTER — Other Ambulatory Visit: Payer: Self-pay

## 2022-06-04 ENCOUNTER — Ambulatory Visit (HOSPITAL_BASED_OUTPATIENT_CLINIC_OR_DEPARTMENT_OTHER)
Admission: RE | Admit: 2022-06-04 | Discharge: 2022-06-04 | Disposition: A | Discharge status: Home / Self Care | Payer: Medicare HMO | Attending: Plastic Surgery | Admitting: Plastic Surgery

## 2022-06-04 ENCOUNTER — Encounter (HOSPITAL_BASED_OUTPATIENT_CLINIC_OR_DEPARTMENT_OTHER): Payer: Self-pay | Admitting: Plastic Surgery

## 2022-06-04 ENCOUNTER — Encounter (HOSPITAL_BASED_OUTPATIENT_CLINIC_OR_DEPARTMENT_OTHER)
Admission: RE | Disposition: A | Discharge status: Home / Self Care | Payer: Self-pay | Source: Home / Self Care | Attending: Plastic Surgery

## 2022-06-04 DIAGNOSIS — G4730 Sleep apnea, unspecified: Secondary | ICD-10-CM | POA: Diagnosis not present

## 2022-06-04 DIAGNOSIS — Z9989 Dependence on other enabling machines and devices: Secondary | ICD-10-CM

## 2022-06-04 DIAGNOSIS — Z87891 Personal history of nicotine dependence: Secondary | ICD-10-CM | POA: Insufficient documentation

## 2022-06-04 DIAGNOSIS — W540XXA Bitten by dog, initial encounter: Secondary | ICD-10-CM | POA: Insufficient documentation

## 2022-06-04 DIAGNOSIS — D649 Anemia, unspecified: Secondary | ICD-10-CM | POA: Diagnosis not present

## 2022-06-04 DIAGNOSIS — S51852A Open bite of left forearm, initial encounter: Secondary | ICD-10-CM | POA: Diagnosis not present

## 2022-06-04 DIAGNOSIS — M1990 Unspecified osteoarthritis, unspecified site: Secondary | ICD-10-CM | POA: Diagnosis not present

## 2022-06-04 DIAGNOSIS — S41152D Open bite of left upper arm, subsequent encounter: Secondary | ICD-10-CM

## 2022-06-04 DIAGNOSIS — S41152A Open bite of left upper arm, initial encounter: Secondary | ICD-10-CM | POA: Insufficient documentation

## 2022-06-04 DIAGNOSIS — G4733 Obstructive sleep apnea (adult) (pediatric): Secondary | ICD-10-CM | POA: Diagnosis not present

## 2022-06-04 DIAGNOSIS — Z01818 Encounter for other preprocedural examination: Secondary | ICD-10-CM

## 2022-06-04 HISTORY — PX: APPLICATION OF WOUND VAC: SHX5189

## 2022-06-04 HISTORY — DX: Sleep apnea, unspecified: G47.30

## 2022-06-04 HISTORY — PX: DEBRIDEMENT AND CLOSURE WOUND: SHX5614

## 2022-06-04 HISTORY — DX: Unspecified osteoarthritis, unspecified site: M19.90

## 2022-06-04 SURGERY — DEBRIDEMENT, WOUND, WITH CLOSURE
Anesthesia: General | Site: Arm Upper | Laterality: Left

## 2022-06-04 MED ORDER — OXYCODONE HCL 5 MG PO TABS: 5.0000 mg | ORAL_TABLET | Freq: Three times a day (TID) | ORAL | 0 refills | Status: AC | PRN

## 2022-06-04 MED ORDER — DEXAMETHASONE SODIUM PHOSPHATE 10 MG/ML IJ SOLN
INTRAMUSCULAR | Status: DC | PRN
  Administered 2022-06-04: 5 mg via INTRAVENOUS

## 2022-06-04 MED ORDER — ACETAMINOPHEN 325 MG RE SUPP: 650.0000 mg | RECTAL | Status: DC | PRN

## 2022-06-04 MED ORDER — ONDANSETRON HCL 4 MG/2ML IJ SOLN: 4.0000 mg | Freq: Once | INTRAMUSCULAR | Status: DC | PRN

## 2022-06-04 MED ORDER — BUPIVACAINE HCL (PF) 0.5 % IJ SOLN
INTRAMUSCULAR | Status: AC
  Filled 2022-06-04: qty 30

## 2022-06-04 MED ORDER — CHLORHEXIDINE GLUCONATE CLOTH 2 % EX PADS: 6.0000 | MEDICATED_PAD | Freq: Once | CUTANEOUS | Status: DC

## 2022-06-04 MED ORDER — ONDANSETRON HCL 4 MG/2ML IJ SOLN
INTRAMUSCULAR | Status: AC
  Filled 2022-06-04: qty 2

## 2022-06-04 MED ORDER — EPHEDRINE 5 MG/ML INJ
INTRAVENOUS | Status: AC
  Filled 2022-06-04: qty 5

## 2022-06-04 MED ORDER — AMISULPRIDE (ANTIEMETIC) 5 MG/2ML IV SOLN: 10.0000 mg | Freq: Once | INTRAVENOUS | Status: DC | PRN

## 2022-06-04 MED ORDER — DEXAMETHASONE SODIUM PHOSPHATE 10 MG/ML IJ SOLN
INTRAMUSCULAR | Status: AC
  Filled 2022-06-04: qty 1

## 2022-06-04 MED ORDER — LIDOCAINE HCL (CARDIAC) PF 100 MG/5ML IV SOSY
PREFILLED_SYRINGE | INTRAVENOUS | Status: DC | PRN
  Administered 2022-06-04: 60 mg via INTRAVENOUS

## 2022-06-04 MED ORDER — 0.9 % SODIUM CHLORIDE (POUR BTL) OPTIME
TOPICAL | Status: DC | PRN
  Administered 2022-06-04: 500 mL

## 2022-06-04 MED ORDER — CEFAZOLIN SODIUM-DEXTROSE 2-4 GM/100ML-% IV SOLN
INTRAVENOUS | Status: AC
  Filled 2022-06-04: qty 100

## 2022-06-04 MED ORDER — LIDOCAINE-EPINEPHRINE (PF) 1 %-1:200000 IJ SOLN
INTRAMUSCULAR | Status: AC
  Filled 2022-06-04: qty 30

## 2022-06-04 MED ORDER — OXYCODONE HCL 5 MG PO TABS: 5.0000 mg | ORAL_TABLET | ORAL | Status: DC | PRN

## 2022-06-04 MED ORDER — LIDOCAINE 2% (20 MG/ML) 5 ML SYRINGE
INTRAMUSCULAR | Status: AC
  Filled 2022-06-04: qty 5

## 2022-06-04 MED ORDER — ACETAMINOPHEN 325 MG PO TABS: 650.0000 mg | ORAL_TABLET | ORAL | Status: DC | PRN

## 2022-06-04 MED ORDER — ACETAMINOPHEN 10 MG/ML IV SOLN
INTRAVENOUS | Status: DC | PRN
  Administered 2022-06-04: 1000 mg via INTRAVENOUS

## 2022-06-04 MED ORDER — BUPIVACAINE-EPINEPHRINE (PF) 0.5% -1:200000 IJ SOLN
INTRAMUSCULAR | Status: AC
  Filled 2022-06-04: qty 30

## 2022-06-04 MED ORDER — FENTANYL CITRATE (PF) 100 MCG/2ML IJ SOLN
INTRAMUSCULAR | Status: AC
  Filled 2022-06-04: qty 2

## 2022-06-04 MED ORDER — MIDAZOLAM HCL 5 MG/5ML IJ SOLN
INTRAMUSCULAR | Status: DC | PRN
  Administered 2022-06-04: 2 mg via INTRAVENOUS

## 2022-06-04 MED ORDER — FENTANYL CITRATE (PF) 100 MCG/2ML IJ SOLN
25.0000 ug | INTRAMUSCULAR | Status: DC | PRN
  Administered 2022-06-04: 50 ug via INTRAVENOUS

## 2022-06-04 MED ORDER — LACTATED RINGERS IV SOLN: INTRAVENOUS | Status: DC

## 2022-06-04 MED ORDER — ACETAMINOPHEN 500 MG PO TABS: 1000.0000 mg | ORAL_TABLET | Freq: Once | ORAL | Status: DC

## 2022-06-04 MED ORDER — FENTANYL CITRATE (PF) 100 MCG/2ML IJ SOLN: 25.0000 ug | INTRAMUSCULAR | Status: DC | PRN

## 2022-06-04 MED ORDER — CEFAZOLIN SODIUM-DEXTROSE 2-4 GM/100ML-% IV SOLN
2.0000 g | INTRAVENOUS | Status: AC
  Administered 2022-06-04: 2 g via INTRAVENOUS

## 2022-06-04 MED ORDER — ONDANSETRON HCL 4 MG/2ML IJ SOLN
INTRAMUSCULAR | Status: DC | PRN
  Administered 2022-06-04: 4 mg via INTRAVENOUS

## 2022-06-04 MED ORDER — EPHEDRINE SULFATE (PRESSORS) 50 MG/ML IJ SOLN
INTRAMUSCULAR | Status: DC | PRN
  Administered 2022-06-04: 5 mg via INTRAVENOUS

## 2022-06-04 MED ORDER — MIDAZOLAM HCL 2 MG/2ML IJ SOLN
INTRAMUSCULAR | Status: AC
  Filled 2022-06-04: qty 2

## 2022-06-04 MED ORDER — PROPOFOL 10 MG/ML IV BOLUS
INTRAVENOUS | Status: DC | PRN
  Administered 2022-06-04: 150 mg via INTRAVENOUS

## 2022-06-04 MED ORDER — ACETAMINOPHEN 10 MG/ML IV SOLN: 1000.0000 mg | Freq: Once | INTRAVENOUS | Status: DC | PRN

## 2022-06-04 MED ORDER — BUPIVACAINE HCL (PF) 0.25 % IJ SOLN
INTRAMUSCULAR | Status: AC
  Filled 2022-06-04: qty 30

## 2022-06-04 MED ORDER — SODIUM CHLORIDE 0.9 % IV SOLN: 250.0000 mL | INTRAVENOUS | Status: DC | PRN

## 2022-06-04 MED ORDER — ACETAMINOPHEN 10 MG/ML IV SOLN
INTRAVENOUS | Status: AC
  Filled 2022-06-04: qty 100

## 2022-06-04 MED ORDER — KETOROLAC TROMETHAMINE 15 MG/ML IJ SOLN: 15.0000 mg | Freq: Once | INTRAMUSCULAR | Status: DC | PRN

## 2022-06-04 MED ORDER — ACETAMINOPHEN 10 MG/ML IV SOLN
1000.0000 mg | Freq: Once | INTRAVENOUS | Status: AC
  Administered 2022-06-04: 1000 mg via INTRAVENOUS

## 2022-06-04 MED ORDER — SODIUM CHLORIDE 0.9% FLUSH: 3.0000 mL | INTRAVENOUS | Status: DC | PRN

## 2022-06-04 MED ORDER — FENTANYL CITRATE (PF) 100 MCG/2ML IJ SOLN
INTRAMUSCULAR | Status: DC | PRN
  Administered 2022-06-04: 50 ug via INTRAVENOUS

## 2022-06-04 MED ORDER — PROPOFOL 10 MG/ML IV BOLUS
INTRAVENOUS | Status: AC
  Filled 2022-06-04: qty 20

## 2022-06-04 MED ORDER — SODIUM CHLORIDE 0.9% FLUSH: 3.0000 mL | Freq: Two times a day (BID) | INTRAVENOUS | Status: DC

## 2022-06-04 SURGICAL SUPPLY — 96 items
ADH SKN CLS APL DERMABOND .7 (GAUZE/BANDAGES/DRESSINGS)
APL SKNCLS STERI-STRIP NONHPOA (GAUZE/BANDAGES/DRESSINGS)
BAG DECANTER FOR FLEXI CONT (MISCELLANEOUS) IMPLANT
BENZOIN TINCTURE PRP APPL 2/3 (GAUZE/BANDAGES/DRESSINGS) IMPLANT
BLADE CLIPPER SURG (BLADE) IMPLANT
BLADE HEX COATED 2.75 (ELECTRODE) ×1 IMPLANT
BLADE MINI RND TIP GREEN BEAV (BLADE) IMPLANT
BLADE SURG 10 STRL SS (BLADE) IMPLANT
BLADE SURG 15 STRL LF DISP TIS (BLADE) ×1 IMPLANT
BLADE SURG 15 STRL SS (BLADE) ×1
BNDG CMPR 5X3 KNIT ELC UNQ LF (GAUZE/BANDAGES/DRESSINGS)
BNDG CMPR 5X4 CHSV STRCH STRL (GAUZE/BANDAGES/DRESSINGS)
BNDG CMPR 5X4 KNIT ELC UNQ LF (GAUZE/BANDAGES/DRESSINGS) ×2
BNDG CMPR 5X62 HK CLSR LF (GAUZE/BANDAGES/DRESSINGS)
BNDG CMPR 6"X 5 YARDS HK CLSR (GAUZE/BANDAGES/DRESSINGS)
BNDG COHESIVE 4X5 TAN STRL LF (GAUZE/BANDAGES/DRESSINGS) IMPLANT
BNDG ELASTIC 3INX 5YD STR LF (GAUZE/BANDAGES/DRESSINGS) IMPLANT
BNDG ELASTIC 4INX 5YD STR LF (GAUZE/BANDAGES/DRESSINGS) IMPLANT
BNDG ELASTIC 6INX 5YD STR LF (GAUZE/BANDAGES/DRESSINGS) IMPLANT
BNDG GAUZE DERMACEA FLUFF 4 (GAUZE/BANDAGES/DRESSINGS) IMPLANT
BNDG GZE DERMACEA 4 6PLY (GAUZE/BANDAGES/DRESSINGS) ×2
CANISTER SUCT 1200ML W/VALVE (MISCELLANEOUS) IMPLANT
CNTNR URN SCR LID CUP LEK RST (MISCELLANEOUS) IMPLANT
CONT SPEC 4OZ STRL OR WHT (MISCELLANEOUS) ×1
CORD BIPOLAR FORCEPS 12FT (ELECTRODE) IMPLANT
COVER BACK TABLE 60X90IN (DRAPES) ×1 IMPLANT
COVER MAYO STAND STRL (DRAPES) ×1 IMPLANT
DERMABOND ADVANCED .7 DNX12 (GAUZE/BANDAGES/DRESSINGS) IMPLANT
DRAIN CHANNEL 19F RND (DRAIN) IMPLANT
DRAIN PENROSE .5X12 LATEX STL (DRAIN) IMPLANT
DRAPE INCISE IOBAN 66X45 STRL (DRAPES) IMPLANT
DRAPE LAPAROSCOPIC ABDOMINAL (DRAPES) IMPLANT
DRAPE LAPAROTOMY 100X72 PEDS (DRAPES) IMPLANT
DRAPE SURG 17X23 STRL (DRAPES) IMPLANT
DRAPE U-SHAPE 76X120 STRL (DRAPES) IMPLANT
DRSG ADAPTIC 3X8 NADH LF (GAUZE/BANDAGES/DRESSINGS) IMPLANT
DRSG CUTIMED SORBACT 7X9 (GAUZE/BANDAGES/DRESSINGS) IMPLANT
DRSG EMULSION OIL 3X3 NADH (GAUZE/BANDAGES/DRESSINGS) IMPLANT
DRSG HYDROCOLLOID 4X4 (GAUZE/BANDAGES/DRESSINGS) IMPLANT
DRSG TEGADERM 4X10 (GAUZE/BANDAGES/DRESSINGS) IMPLANT
DRSG TELFA 3X8 NADH STRL (GAUZE/BANDAGES/DRESSINGS) IMPLANT
DRSG VAC GRANUFOAM LG (GAUZE/BANDAGES/DRESSINGS) IMPLANT
ELECT NDL TIP 2.8 STRL (NEEDLE) IMPLANT
ELECT NEEDLE TIP 2.8 STRL (NEEDLE) IMPLANT
ELECT REM PT RETURN 9FT ADLT (ELECTROSURGICAL) ×1
ELECTRODE REM PT RTRN 9FT ADLT (ELECTROSURGICAL) ×1 IMPLANT
EVACUATOR SILICONE 100CC (DRAIN) IMPLANT
GAUZE PAD ABD 8X10 STRL (GAUZE/BANDAGES/DRESSINGS) IMPLANT
GAUZE SPONGE 4X4 12PLY STRL (GAUZE/BANDAGES/DRESSINGS) ×1 IMPLANT
GAUZE SPONGE 4X4 12PLY STRL LF (GAUZE/BANDAGES/DRESSINGS) IMPLANT
GAUZE XEROFORM 5X9 LF (GAUZE/BANDAGES/DRESSINGS) IMPLANT
GLOVE BIO SURGEON STRL SZ 6.5 (GLOVE) ×2 IMPLANT
GOWN STRL REUS W/ TWL LRG LVL3 (GOWN DISPOSABLE) ×2 IMPLANT
GOWN STRL REUS W/TWL LRG LVL3 (GOWN DISPOSABLE) ×2
GRAFT MYRIAD 7X10 (Graft) IMPLANT
NDL HYPO 25X1 1.5 SAFETY (NEEDLE) ×1 IMPLANT
NDL HYPO 27GX1-1/4 (NEEDLE) IMPLANT
NEEDLE HYPO 25X1 1.5 SAFETY (NEEDLE) ×1 IMPLANT
NEEDLE HYPO 27GX1-1/4 (NEEDLE) IMPLANT
NS IRRIG 1000ML POUR BTL (IV SOLUTION) ×1 IMPLANT
PACK BASIN DAY SURGERY FS (CUSTOM PROCEDURE TRAY) ×1 IMPLANT
PADDING CAST ABS COTTON 3X4 (CAST SUPPLIES) IMPLANT
PADDING CAST ABS COTTON 4X4 ST (CAST SUPPLIES) IMPLANT
PENCIL SMOKE EVACUATOR (MISCELLANEOUS) ×1 IMPLANT
POWDER MORCELSS FINE 2000MG (Miscellaneous) IMPLANT
PWDR MORCELSS FINE 2000MG (Miscellaneous) ×1 IMPLANT
SHEET MEDIUM DRAPE 40X70 STRL (DRAPES) IMPLANT
SLEEVE SCD COMPRESS KNEE MED (STOCKING) ×1 IMPLANT
SPIKE FLUID TRANSFER (MISCELLANEOUS) IMPLANT
SPLINT PLASTER CAST XFAST 3X15 (CAST SUPPLIES) IMPLANT
SPONGE T-LAP 18X18 ~~LOC~~+RFID (SPONGE) ×1 IMPLANT
STAPLER VISISTAT 35W (STAPLE) IMPLANT
STOCKINETTE 4X48 STRL (DRAPES) IMPLANT
STOCKINETTE 6  STRL (DRAPES) ×1
STOCKINETTE 6 STRL (DRAPES) ×1 IMPLANT
STOCKINETTE IMPERVIOUS LG (DRAPES) IMPLANT
SUCTION FRAZIER HANDLE 10FR (MISCELLANEOUS)
SUCTION TUBE FRAZIER 10FR DISP (MISCELLANEOUS) IMPLANT
SURGILUBE 2OZ TUBE FLIPTOP (MISCELLANEOUS) IMPLANT
SUT MNCRL AB 3-0 PS2 18 (SUTURE) IMPLANT
SUT MNCRL AB 4-0 PS2 18 (SUTURE) IMPLANT
SUT MON AB 5-0 PS2 18 (SUTURE) IMPLANT
SUT SILK 3 0 PS 1 (SUTURE) IMPLANT
SUT VIC AB 3-0 FS2 27 (SUTURE) IMPLANT
SUT VIC AB 5-0 P-3 18X BRD (SUTURE) IMPLANT
SUT VIC AB 5-0 P3 18 (SUTURE)
SUT VIC AB 5-0 PS2 18 (SUTURE) IMPLANT
SWAB COLLECTION DEVICE MRSA (MISCELLANEOUS) IMPLANT
SWAB CULTURE ESWAB REG 1ML (MISCELLANEOUS) IMPLANT
SYR BULB IRRIG 60ML STRL (SYRINGE) IMPLANT
SYR CONTROL 10ML LL (SYRINGE) ×1 IMPLANT
TOWEL GREEN STERILE FF (TOWEL DISPOSABLE) ×2 IMPLANT
TRAY DSU PREP LF (CUSTOM PROCEDURE TRAY) ×1 IMPLANT
TUBE CONNECTING 20X1/4 (TUBING) ×1 IMPLANT
UNDERPAD 30X36 HEAVY ABSORB (UNDERPADS AND DIAPERS) ×1 IMPLANT
YANKAUER SUCT BULB TIP NO VENT (SUCTIONS) ×1 IMPLANT

## 2022-06-04 NOTE — Discharge Instructions (Addendum)
Next dose of Tylenol due anytime after 3:15 today if needed  Wound Care   Guide to Wound Care  Proper wound care may reduce the risk of infection, improve healing rates, and limit scarring.  This is a general guide to help care for and manage wounds treated with product wound matrix.   Dressing Changes The frequency of dressing changes can vary based on which product was applied, the size of the wound, or the amount of wound drainage. Dressing inspections are recommended, at least weekly.   If you have a Wound VAC it will be changed in one week after the first time it is applied.  Then it will be changed once or twice a week.    Dressing Types:  You should not need to do any of this.  We will change the VAC in one week. Primary Dressing:  Non-adherent dressing goes directly over wounds being treated with the powder or sheet.  Secondary Dressing:  Secures the primary dressing in place and provides extra protection, compression, and absorption.  1. Wash Hands - To help decrease the risk of infection, caregivers should wash their hands for a minimum of 20 seconds and may use medical gloves.   2. Remove the Dressings - Avoid removing product from the wound by carefully removing the applicable dressing(s) at the time points recommended above, or as recommended by the treating physician.  Expected Color and Odor:  It is entirely normal for the wound to have an unpleasant odor and to form a caramel-colored gel as the product absorbs into the wound. It is important to leave this gel on the wound site.  3. Clean the Wound - Use clean water or saline to gently rinse around the wound surface and remove any excess discharge that may be present on the wound. Do not wipe off any of the caramel-colored gel on the wound.   What to look out for:  Large or increased amount of drainage   Surrounding skin has worsening redness or hot to touch   Increased pain in or around the wound   Flu-like symptoms,  fatigue, decreased appetite, fever   Hard, crusty wound surface with black or brown coloring  4. Apply New Dressings - Dressings should cover the entire wound and be suitable for maintaining a moist wound environment.  The non-adherent mesh dressing should be left in place.  New dressing should consist of [place] Jelly to keep the wound moist and soft gauze secured with a wrap or tape.   Maintain a Hydrated Wound Area It is important to keep the wound area moist throughout the healing process. If the wound appears to be dry during dressing changes, select a dressing that will hydrate the wound and maintain that ideal moist environment. If you are unsure what to do, ask the treating physician.  Remodeling Process Every patient heals differently, and no two cases are the same. The size and location of the wound, product type and layering configurations, and general patient health all contribute to how quickly a wound will heal.  While many factors can influence the rate at which the product absorbs, the following can be used as a general guide.   THINGS TO DO: Refrain from smoking High protein diet with plenty of vegetables and some fruit  Limit simple processed carbohydrates and sugar Protect the wound from trauma Protect the dressing  powder       Sheet  Sorbact dressing    Post Anesthesia Home Care Instructions  Activity: Get plenty of rest for the remainder of the day. A responsible individual must stay with you for 24 hours following the procedure.  For the next 24 hours, DO NOT: -Drive a car -Advertising copywriter -Drink alcoholic beverages -Take any medication unless instructed by your physician -Make any legal decisions or sign important papers.  Meals: Start with liquid foods such as gelatin or soup. Progress to regular foods as tolerated. Avoid greasy, spicy, heavy foods. If nausea and/or vomiting occur, drink only clear liquids until the nausea and/or vomiting subsides.  Call your physician if vomiting continues.  Special Instructions/Symptoms: Your throat may feel dry or sore from the anesthesia or the breathing tube placed in your throat during surgery. If this causes discomfort, gargle with warm salt water. The discomfort should disappear within 24 hours.  If you had a scopolamine patch placed behind your ear for the management of post- operative nausea and/or vomiting:  1. The medication in the patch is effective for 72 hours, after which it should be removed.  Wrap patch in a tissue and discard in the trash. Wash hands thoroughly with soap and water. 2. You may remove the patch earlier than 72 hours if you experience unpleasant side effects which may include dry mouth, dizziness or visual disturbances. 3. Avoid touching the patch. Wash your hands with soap and water after contact with the patch.

## 2022-06-04 NOTE — Anesthesia Postprocedure Evaluation (Signed)
Anesthesia Post Note  Patient: [name] Performed: ARM DEBRIDEMENT WITH MYRIAD PLACEMENT, wound vac change (Left: Arm Upper) APPLICATION OF SKIN SUBSTITUTE (Left: Arm Upper) APPLICATION OF WOUND VAC (Left: Arm Upper)     Patient location during evaluation: PACU Anesthesia Type: General Level of consciousness: awake Pain management: pain level controlled Vital Signs Assessment: post-procedure vital signs reviewed and stable Respiratory status: spontaneous breathing, nonlabored ventilation and respiratory function stable Cardiovascular status: blood pressure returned to baseline and stable Postop Assessment: no apparent nausea or vomiting Anesthetic complications: no   No notable events documented.  Last Vitals:  Vitals:   06/04/22 1030 06/04/22 1129  BP: (!) 155/76 (!) 142/54  Pulse: 61 (!) 106  Resp: 14 16  Temp:  (!) 36.2 C  SpO2: 97% 99%    Last Pain:  Vitals:   06/04/22 1030  TempSrc:   PainSc: 6                  April Bowman P Nataleigh Griffin

## 2022-06-04 NOTE — H&P (Signed)
April Bowman is an 67 y.o. female.   Chief Complaint: left arm wound HPI: The patient is a 67 yrs old female here for treatment of her left arm.  Several weeks ago she was attacked by a neighbor's dog.  She sustained multiple injuries.  The left arm was severely injured.  She has undergone debridement and placement of myriad with the vac placed.  She is here for further treatment of the left arm wound.  Past Medical History:  Diagnosis Date   Arthritis    Sleep apnea     Past Surgical History:  Procedure Laterality Date   AUGMENTATION MAMMAPLASTY     DEBRIDEMENT AND CLOSURE WOUND Left 05/26/2022   Procedure: ARM DEBRIDEMENT WITH MYRIAD PLACEMENT AND CHANGE WOUND VAC;  Surgeon: Peggye Form, DO;  Location: [facility] OR;  Service: Plastics;  Laterality: Left;   hip replacements Bilateral    INCISION AND DRAINAGE OF WOUND Left 05/20/2022   Procedure: IRRIGATION AND DEBRIDEMENT AND WOUND VAC PLACEMENT LEFT ARM;  Surgeon: Ramon Dredge, MD;  Location: [facility] OR;  Service: Orthopedics;  Laterality: Left;   MINOR IRRIGATION AND DEBRIDEMENT OF WOUND Right 05/20/2022   Procedure: IRRIGATION AND DEBRIDEMENT OF WOUND RIGHT ARM;  Surgeon: Ramon Dredge, MD;  Location: [facility] OR;  Service: Orthopedics;  Laterality: Right;    Family History  Problem Relation Age of Onset   Breast cancer Neg Hx    Social History:  reports that she quit smoking about 12 years ago. Her smoking use included cigarettes. She has a 17.50 pack-year smoking history. She has never used smokeless tobacco. She reports current alcohol use. She reports that she does not use drugs.  Allergies: No Known Allergies  Medications Prior to Admission  Medication Sig Dispense Refill   acetaminophen (TYLENOL) 500 MG tablet Take 2 tablets (1,000 mg total) by mouth every 6 (six) hours as needed for mild pain or fever.     ASCORBIC ACID PO Take 1 tablet by mouth daily. Vitamin C, unknown strength.     Cholecalciferol (VITAMIN  D-3 PO) Take 1 capsule by mouth daily.     Cyanocobalamin (VITAMIN B-12 PO) Take 1 tablet by mouth daily.     diclofenac (VOLTAREN) 75 MG EC tablet Take 75 mg by mouth daily as needed for moderate pain.     estradiol (ESTRACE VAGINAL) 0.1 MG/GM vaginal cream Place 1 g vaginally at bedtime.     gabapentin (NEURONTIN) 300 MG capsule Take 1 capsule (300 mg total) by mouth 3 (three) times daily. 90 capsule 0   MAGNESIUM OXIDE PO Take 1 tablet by mouth daily.     methocarbamol (ROBAXIN) 500 MG tablet Take 2 tablets (1,000 mg total) by mouth 3 (three) times daily. 30 tablet 0   MORINGA OLEIFERA PO Take 1 capsule by mouth daily.     OVER THE COUNTER MEDICATION Take 1 tablet by mouth daily. Vitamin A + Vitamin D3 combination supplement.     oxyCODONE (OXY IR/ROXICODONE) 5 MG immediate release tablet Take 1-2 tablets (5-10 mg total) by mouth every 4 (four) hours as needed for moderate pain or severe pain. 30 tablet 0   POTASSIUM GLUCONATE PO Take 1 tablet by mouth daily.     tiZANidine (ZANAFLEX) 4 MG capsule Take 4 mg by mouth every 6 (six) hours as needed for muscle spasms.     VITAMIN E PO Take 1 capsule by mouth daily.     hydrOXYzine (ATARAX) 25 MG tablet Take 1 tablet (25 mg  total) by mouth 3 (three) times daily as needed for anxiety. 30 tablet 0   ibuprofen (ADVIL,MOTRIN) 800 MG tablet Take 800 mg by mouth 3 (three) times daily as needed.  2   QUEtiapine (SEROQUEL) 100 MG tablet Take 100 mg by mouth at bedtime.     traZODone (DESYREL) 50 MG tablet Take 50 mg by mouth at bedtime as needed for sleep.      No results found for this or any previous visit (from the past 48 hour(s)). No results found.  Review of Systems  Constitutional:  Positive for activity change.  Eyes: Negative.   Respiratory: Negative.    Cardiovascular: Negative.   Gastrointestinal: Negative.   Endocrine: Negative.   Genitourinary: Negative.   Musculoskeletal: Negative.     Blood pressure 133/72, pulse (!) 58,  temperature 98 F (36.7 C), temperature source Oral, resp. rate 18, height 5\' 1"  (1.549 m), weight 67.9 kg, SpO2 100 %. Physical Exam Vitals and nursing note reviewed.  Constitutional:      Appearance: Normal appearance.  HENT:     Head: Normocephalic and atraumatic.  Cardiovascular:     Rate and Rhythm: Normal rate.     Pulses: Normal pulses.  Pulmonary:     Effort: Pulmonary effort is normal.  Abdominal:     Palpations: Abdomen is soft.  Musculoskeletal:        General: Swelling, tenderness, deformity and signs of injury present.  Skin:    General: Skin is warm.     Capillary Refill: Capillary refill takes less than 2 seconds.     Findings: Bruising and lesion present.  Neurological:     Mental Status: She is alert and oriented to person, place, and time.  Psychiatric:        Mood and Affect: Mood normal.        Behavior: Behavior normal.        Thought Content: Thought content normal.        Judgment: Judgment normal.      Assessment/Plan Left arm wound/dog bite.  Plan for further debridement with myriad and Vac placement.  April Bills Angelys Yetman, DO 06/04/2022, 8:31 AM

## 2022-06-04 NOTE — Op Note (Signed)
DATE OF OPERATION: 06/04/2022  LOCATION: [facility]  PREOPERATIVE DIAGNOSIS: left arm dog bite / wound  POSTOPERATIVE DIAGNOSIS: Same  PROCEDURE: preparation of 9 x 13 cm left arm dog bite/wound for placement of Myriad 2 gm powder and 7 x 10 cm sheet.  Placement of VAC.  SURGEON: Aloysuis Ribaudo Sanger Roderick Calo, DO  ASSISTANT: Evelena Leyden, PA  EBL: none  CONDITION: Stable  COMPLICATIONS: None  INDICATION: The patient, April Bowman, is a 67 y.o. female born on 1955/05/07, is here for treatment of a left arm dog bite/wound.   PROCEDURE DETAILS:  The patient was seen prior to surgery and marked.  The IV antibiotics were given. The patient was taken to the operating room and given a general anesthetic. A standard time out was performed and all information was confirmed by those in the room.  The patient had wounds on her legs as well so no SCDs were placed.  The left arm was prepped and draped.  The arm/wound was irrigated with saline.  The 9 x 13 cm wound had good incorporation of the previously placed myriad.  All of the myriad powder and sheet was applied and secured with the 5-0 Vicryl.  The sorbact was placed over it with KY gel.  The VAC was applied and had an excellent seal.  The patient was allowed to wake up and taken to recovery room in stable condition at the end of the case. The family was notified at the end of the case.   The advanced practice practitioner (APP) assisted throughout the case.  The APP was essential in retraction and counter traction when needed to make the case progress smoothly.  This retraction and assistance made it possible to see the tissue plans for the procedure.  The assistance was needed for blood control, tissue re-approximation and assisted with closure of the incision site.

## 2022-06-04 NOTE — Interval H&P Note (Signed)
History and Physical Interval Note:  06/04/2022 8:31 AM  [name]  has presented today for surgery, with the diagnosis of Dog bite of left upper extremity.  The various methods of treatment have been discussed with the patient and family. After consideration of risks, benefits and other options for treatment, the patient has consented to  Procedure(s): ARM DEBRIDEMENT WITH MYRIAD PLACEMENT, wound vac change (Left) APPLICATION OF SKIN SUBSTITUTE (Left) APPLICATION OF WOUND VAC (Left) as a surgical intervention.  The patient's history has been reviewed, patient examined, no change in status, stable for surgery.  I have reviewed the patient's chart and labs.  Questions were answered to the patient's satisfaction.     Alena Bills Conrado Nance

## 2022-06-04 NOTE — Anesthesia Preprocedure Evaluation (Addendum)
Anesthesia Evaluation  Patient identified by MRN, date of birth, ID band Patient awake    Reviewed: Allergy & Precautions, NPO status , Patient's Chart, lab work & pertinent test results  Airway Mallampati: II  TM Distance: >3 FB Neck ROM: Full    Dental no notable dental hx.    Pulmonary sleep apnea and Continuous Positive Airway Pressure Ventilation , former smoker   Pulmonary exam normal        Cardiovascular negative cardio ROS Normal cardiovascular exam     Neuro/Psych negative neurological ROS  negative psych ROS   GI/Hepatic negative GI ROS, Neg liver ROS,,,  Endo/Other  negative endocrine ROS    Renal/GU negative Renal ROS     Musculoskeletal  (+) Arthritis ,    Abdominal   Peds  Hematology  (+) Blood dyscrasia, anemia   Anesthesia Other Findings Dog bite of left upper extremity  Reproductive/Obstetrics                             Anesthesia Physical Anesthesia Plan  ASA: 2  Anesthesia Plan: General   Post-op Pain Management:    Induction: Intravenous  PONV Risk Score and Plan: 3 and Ondansetron, Dexamethasone, Midazolam and Treatment may vary due to age or medical condition  Airway Management Planned: LMA  Additional Equipment:   Intra-op Plan:   Post-operative Plan: Extubation in OR  Informed Consent: I have reviewed the patients History and Physical, chart, labs and discussed the procedure including the risks, benefits and alternatives for the proposed anesthesia with the patient or authorized representative who has indicated his/her understanding and acceptance.     Dental advisory given  Plan Discussed with: CRNA  Anesthesia Plan Comments:        Anesthesia Quick Evaluation

## 2022-06-04 NOTE — Transfer of Care (Signed)
Immediate Anesthesia Transfer of Care Note  Patient: [name] Performed: ARM DEBRIDEMENT WITH MYRIAD PLACEMENT, wound vac change (Left: Arm Upper) APPLICATION OF SKIN SUBSTITUTE (Left: Arm Upper) APPLICATION OF WOUND VAC (Left: Arm Upper)  Patient Location: PACU  Anesthesia Type:General  Level of Consciousness: drowsy, patient cooperative, and responds to stimulation  Airway & Oxygen Therapy: Patient Spontanous Breathing and Patient connected to face mask oxygen  Post-op Assessment: Report given to RN and Post -op Vital signs reviewed and stable  Post vital signs: Reviewed and stable  Last Vitals:  Vitals Value Taken Time  BP    Temp    Pulse 65 06/04/22 1000  Resp 14 06/04/22 1000  SpO2 100 % 06/04/22 1000  Vitals shown include unvalidated device data.  Last Pain:  Vitals:   06/04/22 0820  TempSrc: Oral  PainSc: 6       Patients Stated Pain Goal: 3 (06/04/22 0820)  Complications: No notable events documented.

## 2022-06-04 NOTE — Anesthesia Procedure Notes (Signed)
Procedure Name: LMA Insertion Date/Time: 06/04/2022 9:27 AM  Performed by: Thornell Mule, CRNAPre-anesthesia Checklist: Patient identified, Emergency Drugs available, Suction available and Patient being monitored Patient Re-evaluated:Patient Re-evaluated prior to induction Oxygen Delivery Method: Circle system utilized Preoxygenation: Pre-oxygenation with 100% oxygen Induction Type: IV induction LMA: LMA inserted LMA Size: 4.0 Number of attempts: 1 Placement Confirmation: positive ETCO2 Tube secured with: Tape Dental Injury: Teeth and Oropharynx as per pre-operative assessment

## 2022-06-05 ENCOUNTER — Encounter (HOSPITAL_BASED_OUTPATIENT_CLINIC_OR_DEPARTMENT_OTHER): Payer: Self-pay | Admitting: Plastic Surgery

## 2022-06-05 ENCOUNTER — Other Ambulatory Visit: Payer: Self-pay

## 2022-06-06 ENCOUNTER — Encounter: Payer: Self-pay | Admitting: Physician Assistant

## 2022-06-06 ENCOUNTER — Telehealth: Payer: Self-pay | Admitting: *Deleted

## 2022-06-06 ENCOUNTER — Ambulatory Visit (INDEPENDENT_AMBULATORY_CARE_PROVIDER_SITE_OTHER): Payer: Medicare HMO | Admitting: Physician Assistant

## 2022-06-06 VITALS — BP 154/88 | HR 59

## 2022-06-06 DIAGNOSIS — G4700 Insomnia, unspecified: Secondary | ICD-10-CM | POA: Diagnosis not present

## 2022-06-06 DIAGNOSIS — S41152S Open bite of left upper arm, sequela: Secondary | ICD-10-CM

## 2022-06-06 DIAGNOSIS — S41152A Open bite of left upper arm, initial encounter: Secondary | ICD-10-CM

## 2022-06-06 DIAGNOSIS — S51852D Open bite of left forearm, subsequent encounter: Secondary | ICD-10-CM | POA: Diagnosis not present

## 2022-06-06 DIAGNOSIS — S51811D Laceration without foreign body of right forearm, subsequent encounter: Secondary | ICD-10-CM | POA: Diagnosis not present

## 2022-06-06 DIAGNOSIS — W540XXD Bitten by dog, subsequent encounter: Secondary | ICD-10-CM | POA: Diagnosis not present

## 2022-06-06 DIAGNOSIS — Z9981 Dependence on supplemental oxygen: Secondary | ICD-10-CM | POA: Diagnosis not present

## 2022-06-06 DIAGNOSIS — Z87891 Personal history of nicotine dependence: Secondary | ICD-10-CM | POA: Diagnosis not present

## 2022-06-06 DIAGNOSIS — S91011D Laceration without foreign body, right ankle, subsequent encounter: Secondary | ICD-10-CM | POA: Diagnosis not present

## 2022-06-06 DIAGNOSIS — S52609B Unspecified fracture of lower end of unspecified ulna, initial encounter for open fracture type I or II: Secondary | ICD-10-CM | POA: Insufficient documentation

## 2022-06-06 DIAGNOSIS — S52602B Unspecified fracture of lower end of left ulna, initial encounter for open fracture type I or II: Secondary | ICD-10-CM | POA: Diagnosis not present

## 2022-06-06 DIAGNOSIS — S81012D Laceration without foreign body, left knee, subsequent encounter: Secondary | ICD-10-CM | POA: Diagnosis not present

## 2022-06-06 DIAGNOSIS — W540XXA Bitten by dog, initial encounter: Secondary | ICD-10-CM

## 2022-06-06 MED ORDER — NAPROXEN 500 MG PO TABS: 500.0000 mg | ORAL_TABLET | Freq: Two times a day (BID) | ORAL | 0 refills | Status: DC | PRN

## 2022-06-06 NOTE — H&P (View-Only) (Signed)
Patient is a pleasant 67-year-old female with LUE wound s/p debridement with wound matrix and wound VAC placement performed twice, most recently 06/04/2022 by Dr. Dillingham, who presents to clinic for postoperative follow-up.  She called the office with complaints of pain at the wound VAC placement site and expressed that she was going to go to the emergency department.  She was advised to come to the office instead for evaluation.    She is accompanied by family as well as Spanish interpreter at bedside.  Today, she tells me that she is experiencing a constant burning sensation along the medial aspect of her left arm.  She states that there is an area near medial epicondyle that burns and also states that there is some thumb involvement.  She states that she is taking her prescribed medications including oxycodone and gabapentin without any considerable relief.  She has not been taking any Tylenol or NSAIDs.  She states that she is not on any blood thinners.  She is otherwise doing okay, but was concern for possible nerve injury versus infection.  With her bandages still on, she was able to flex her digits of left hand.  Limited ability to extend digits.  Grossly assessed radial, ulnar, and median nerve-all intact.  Good sensation throughout.  Good capillary refill.  Removed Ace wrap and Kerlix.  Good radial pulse.  No concerning overlying skin changes.  Wound VAC is in place and functional.  Good seal.  Functioning at 125 mmHg.  Some edema noted over dorsum of hand.  Discussed with patient why removal of the wound VAC replacement this time would not be a good idea given that there is nothing concerning on exam and it would likely be poorly tolerated due to pain.  Furthermore, the Sorbact was not secured with stitches which may result in the Sorbact and underlying matrix to be disturbed.  She is agreeable with leaving this in place, patient simply wanted to make sure there was no concerning infection.  The  pain that she described sounds neuropathic.  She understands that given the significance of her injury, pain is not unexpected postoperatively and the medications prescribed can only mitigate the discomfort rather than eliminate it.  She requested an NSAID prescription and will send in naproxen.  Encouraged her to take Tylenol, as well.    Rewrapped her Kerlix and Ace wrap and encouraged her to call the office should she have any questions or concerns over the weekend. Otherwise, plan for her to go to the operating room for wound matrix placement and VAC change 06/12/2022, as scheduled.  Patient voices understanding and is agreeable to the plan. 

## 2022-06-06 NOTE — Progress Notes (Signed)
April Bowman is a pleasant 67 year old female with LUE wound s/p debridement with wound matrix and wound VAC placement performed twice, most recently 06/04/2022 by Dr. Ulice Bold, who presents to clinic for postoperative follow-up.  She called the office with complaints of pain at the wound VAC placement site and expressed that she was going to go to the emergency department.  She was advised to come to the office instead for evaluation.    She is accompanied by family as well as Spanish interpreter at bedside.  Today, she tells me that she is experiencing a constant burning sensation along the medial aspect of her left arm.  She states that there is an area near medial epicondyle that burns and also states that there is some thumb involvement.  She states that she is taking her prescribed medications including oxycodone and gabapentin without any considerable relief.  She has not been taking any Tylenol or NSAIDs.  She states that she is not on any blood thinners.  She is otherwise doing okay, but was concern for possible nerve injury versus infection.  With her bandages still on, she was able to flex her digits of left hand.  Limited ability to extend digits.  Grossly assessed radial, ulnar, and median nerve-all intact.  Good sensation throughout.  Good capillary refill.  Removed Ace wrap and Kerlix.  Good radial pulse.  No concerning overlying skin changes.  Wound VAC is in place and functional.  Good seal.  Functioning at 125 mmHg.  Some edema noted over dorsum of hand.  Discussed with April Bowman why removal of the wound VAC replacement this time would not be a good idea given that there is nothing concerning on exam and it would likely be poorly tolerated due to pain.  Furthermore, the Sorbact was not secured with stitches which may result in the Sorbact and underlying matrix to be disturbed.  She is agreeable with leaving this in place, April Bowman simply wanted to make sure there was no concerning infection.  The  pain that she described sounds neuropathic.  She understands that given the significance of her injury, pain is not unexpected postoperatively and the medications prescribed can only mitigate the discomfort rather than eliminate it.  She requested an NSAID prescription and will send in naproxen.  Encouraged her to take Tylenol, as well.    Rewrapped her Kerlix and Ace wrap and encouraged her to call the office should she have any questions or concerns over the weekend. Otherwise, plan for her to go to the operating room for wound matrix placement and VAC change 06/12/2022, as scheduled.  April Bowman voices understanding and is agreeable to the plan.

## 2022-06-06 NOTE — Telephone Encounter (Signed)
Call received from [facility], RN (care coordinator) who states she called patient this morning to check on her post-op and patient stated her wound vac was "pinching" and she was preparing to go to the ED. She wanted to check to see if there were alternatives before she goes to the ED. Pt had surgery with Dr. Ulice Bold on 5/22. Please advise [phone]

## 2022-06-06 NOTE — Telephone Encounter (Signed)
Perfect, thank you

## 2022-06-06 NOTE — Telephone Encounter (Signed)
Recommend that patient come here to clinic for evaluation instead.

## 2022-06-09 DIAGNOSIS — G4700 Insomnia, unspecified: Secondary | ICD-10-CM | POA: Diagnosis not present

## 2022-06-09 DIAGNOSIS — S91011D Laceration without foreign body, right ankle, subsequent encounter: Secondary | ICD-10-CM | POA: Diagnosis not present

## 2022-06-09 DIAGNOSIS — S51811D Laceration without foreign body of right forearm, subsequent encounter: Secondary | ICD-10-CM | POA: Diagnosis not present

## 2022-06-09 DIAGNOSIS — Z87891 Personal history of nicotine dependence: Secondary | ICD-10-CM | POA: Diagnosis not present

## 2022-06-09 DIAGNOSIS — S81012D Laceration without foreign body, left knee, subsequent encounter: Secondary | ICD-10-CM | POA: Diagnosis not present

## 2022-06-09 DIAGNOSIS — Z9981 Dependence on supplemental oxygen: Secondary | ICD-10-CM | POA: Diagnosis not present

## 2022-06-09 DIAGNOSIS — W540XXD Bitten by dog, subsequent encounter: Secondary | ICD-10-CM | POA: Diagnosis not present

## 2022-06-09 DIAGNOSIS — S51852D Open bite of left forearm, subsequent encounter: Secondary | ICD-10-CM | POA: Diagnosis not present

## 2022-06-11 ENCOUNTER — Telehealth: Payer: Self-pay | Admitting: Physician Assistant

## 2022-06-11 DIAGNOSIS — S81012D Laceration without foreign body, left knee, subsequent encounter: Secondary | ICD-10-CM | POA: Diagnosis not present

## 2022-06-11 DIAGNOSIS — S51811D Laceration without foreign body of right forearm, subsequent encounter: Secondary | ICD-10-CM | POA: Diagnosis not present

## 2022-06-11 DIAGNOSIS — G4700 Insomnia, unspecified: Secondary | ICD-10-CM | POA: Diagnosis not present

## 2022-06-11 DIAGNOSIS — Z87891 Personal history of nicotine dependence: Secondary | ICD-10-CM | POA: Diagnosis not present

## 2022-06-11 DIAGNOSIS — S51852D Open bite of left forearm, subsequent encounter: Secondary | ICD-10-CM | POA: Diagnosis not present

## 2022-06-11 DIAGNOSIS — S91011D Laceration without foreign body, right ankle, subsequent encounter: Secondary | ICD-10-CM | POA: Diagnosis not present

## 2022-06-11 DIAGNOSIS — Z9981 Dependence on supplemental oxygen: Secondary | ICD-10-CM | POA: Diagnosis not present

## 2022-06-11 DIAGNOSIS — W540XXD Bitten by dog, subsequent encounter: Secondary | ICD-10-CM | POA: Diagnosis not present

## 2022-06-11 NOTE — Telephone Encounter (Signed)
Attempted to call the patient, daughter, and granddaughter about new surgery time. Voicemail's left.

## 2022-06-12 ENCOUNTER — Ambulatory Visit (HOSPITAL_BASED_OUTPATIENT_CLINIC_OR_DEPARTMENT_OTHER): Payer: Medicare HMO | Admitting: Anesthesiology

## 2022-06-12 ENCOUNTER — Ambulatory Visit (HOSPITAL_BASED_OUTPATIENT_CLINIC_OR_DEPARTMENT_OTHER)
Admission: RE | Admit: 2022-06-12 | Discharge: 2022-06-12 | Disposition: A | Discharge status: Home / Self Care | Payer: Medicare HMO | Attending: Plastic Surgery | Admitting: Plastic Surgery

## 2022-06-12 ENCOUNTER — Encounter (HOSPITAL_BASED_OUTPATIENT_CLINIC_OR_DEPARTMENT_OTHER): Payer: Self-pay | Admitting: Plastic Surgery

## 2022-06-12 ENCOUNTER — Encounter (HOSPITAL_BASED_OUTPATIENT_CLINIC_OR_DEPARTMENT_OTHER)
Admission: RE | Disposition: A | Discharge status: Home / Self Care | Payer: Self-pay | Source: Home / Self Care | Attending: Plastic Surgery

## 2022-06-12 DIAGNOSIS — G4733 Obstructive sleep apnea (adult) (pediatric): Secondary | ICD-10-CM | POA: Diagnosis not present

## 2022-06-12 DIAGNOSIS — Z09 Encounter for follow-up examination after completed treatment for conditions other than malignant neoplasm: Secondary | ICD-10-CM | POA: Insufficient documentation

## 2022-06-12 DIAGNOSIS — Z87891 Personal history of nicotine dependence: Secondary | ICD-10-CM | POA: Diagnosis not present

## 2022-06-12 DIAGNOSIS — W540XXA Bitten by dog, initial encounter: Secondary | ICD-10-CM | POA: Diagnosis not present

## 2022-06-12 DIAGNOSIS — S41152A Open bite of left upper arm, initial encounter: Secondary | ICD-10-CM | POA: Diagnosis not present

## 2022-06-12 DIAGNOSIS — Z9989 Dependence on other enabling machines and devices: Secondary | ICD-10-CM

## 2022-06-12 DIAGNOSIS — Z01818 Encounter for other preprocedural examination: Secondary | ICD-10-CM

## 2022-06-12 DIAGNOSIS — S41152D Open bite of left upper arm, subsequent encounter: Secondary | ICD-10-CM

## 2022-06-12 DIAGNOSIS — D649 Anemia, unspecified: Secondary | ICD-10-CM

## 2022-06-12 HISTORY — PX: APPLICATION OF WOUND VAC: SHX5189

## 2022-06-12 SURGERY — APPLICATION, WOUND VAC
Anesthesia: General | Site: Arm Upper | Laterality: Left

## 2022-06-12 MED ORDER — ACETAMINOPHEN 500 MG PO TABS
ORAL_TABLET | ORAL | Status: AC
  Filled 2022-06-12: qty 2

## 2022-06-12 MED ORDER — ONDANSETRON HCL 4 MG/2ML IJ SOLN: 4.0000 mg | Freq: Once | INTRAMUSCULAR | Status: DC | PRN

## 2022-06-12 MED ORDER — ACETAMINOPHEN 325 MG RE SUPP: 650.0000 mg | RECTAL | Status: DC | PRN

## 2022-06-12 MED ORDER — DEXAMETHASONE SODIUM PHOSPHATE 10 MG/ML IJ SOLN
INTRAMUSCULAR | Status: AC
  Filled 2022-06-12: qty 1

## 2022-06-12 MED ORDER — LIDOCAINE HCL (CARDIAC) PF 100 MG/5ML IV SOSY
PREFILLED_SYRINGE | INTRAVENOUS | Status: DC | PRN
  Administered 2022-06-12: 50 mg via INTRAVENOUS

## 2022-06-12 MED ORDER — LACTATED RINGERS IV SOLN: INTRAVENOUS | Status: DC

## 2022-06-12 MED ORDER — LIDOCAINE 2% (20 MG/ML) 5 ML SYRINGE
INTRAMUSCULAR | Status: AC
  Filled 2022-06-12: qty 5

## 2022-06-12 MED ORDER — DEXAMETHASONE SODIUM PHOSPHATE 4 MG/ML IJ SOLN
INTRAMUSCULAR | Status: DC | PRN
  Administered 2022-06-12: 4 mg via INTRAVENOUS

## 2022-06-12 MED ORDER — EPHEDRINE 5 MG/ML INJ
INTRAVENOUS | Status: AC
  Filled 2022-06-12: qty 5

## 2022-06-12 MED ORDER — ONDANSETRON HCL 4 MG/2ML IJ SOLN
INTRAMUSCULAR | Status: AC
  Filled 2022-06-12: qty 2

## 2022-06-12 MED ORDER — 0.9 % SODIUM CHLORIDE (POUR BTL) OPTIME
TOPICAL | Status: DC | PRN
  Administered 2022-06-12: 1000 mL

## 2022-06-12 MED ORDER — FENTANYL CITRATE (PF) 100 MCG/2ML IJ SOLN
25.0000 ug | INTRAMUSCULAR | Status: DC | PRN
  Administered 2022-06-12: 50 ug via INTRAVENOUS

## 2022-06-12 MED ORDER — CEFAZOLIN SODIUM-DEXTROSE 2-4 GM/100ML-% IV SOLN
INTRAVENOUS | Status: AC
  Filled 2022-06-12: qty 100

## 2022-06-12 MED ORDER — CHLORHEXIDINE GLUCONATE CLOTH 2 % EX PADS: 6.0000 | MEDICATED_PAD | Freq: Once | CUTANEOUS | Status: DC

## 2022-06-12 MED ORDER — SODIUM CHLORIDE 0.9 % IV SOLN: 250.0000 mL | INTRAVENOUS | Status: DC | PRN

## 2022-06-12 MED ORDER — AMISULPRIDE (ANTIEMETIC) 5 MG/2ML IV SOLN: 10.0000 mg | Freq: Once | INTRAVENOUS | Status: DC | PRN

## 2022-06-12 MED ORDER — ACETAMINOPHEN 325 MG PO TABS: 650.0000 mg | ORAL_TABLET | ORAL | Status: DC | PRN

## 2022-06-12 MED ORDER — PROPOFOL 10 MG/ML IV BOLUS
INTRAVENOUS | Status: DC | PRN
  Administered 2022-06-12: 150 mg via INTRAVENOUS

## 2022-06-12 MED ORDER — ACETAMINOPHEN 500 MG PO TABS
1000.0000 mg | ORAL_TABLET | Freq: Once | ORAL | Status: AC
  Administered 2022-06-12: 1000 mg via ORAL

## 2022-06-12 MED ORDER — MIDAZOLAM HCL 2 MG/2ML IJ SOLN
INTRAMUSCULAR | Status: AC
  Filled 2022-06-12: qty 2

## 2022-06-12 MED ORDER — CEFAZOLIN SODIUM-DEXTROSE 2-4 GM/100ML-% IV SOLN
2.0000 g | INTRAVENOUS | Status: AC
  Administered 2022-06-12: 2 g via INTRAVENOUS

## 2022-06-12 MED ORDER — SODIUM CHLORIDE 0.9% FLUSH: 3.0000 mL | INTRAVENOUS | Status: DC | PRN

## 2022-06-12 MED ORDER — KETOROLAC TROMETHAMINE 30 MG/ML IJ SOLN
INTRAMUSCULAR | Status: AC
  Filled 2022-06-12: qty 1

## 2022-06-12 MED ORDER — SUCCINYLCHOLINE CHLORIDE 200 MG/10ML IV SOSY
PREFILLED_SYRINGE | INTRAVENOUS | Status: AC
  Filled 2022-06-12: qty 10

## 2022-06-12 MED ORDER — FENTANYL CITRATE (PF) 100 MCG/2ML IJ SOLN
INTRAMUSCULAR | Status: DC | PRN
  Administered 2022-06-12: 50 ug via INTRAVENOUS

## 2022-06-12 MED ORDER — KETOROLAC TROMETHAMINE 15 MG/ML IJ SOLN: 15.0000 mg | Freq: Once | INTRAMUSCULAR | Status: DC | PRN

## 2022-06-12 MED ORDER — OXYCODONE HCL 5 MG PO TABS: 5.0000 mg | ORAL_TABLET | ORAL | Status: DC | PRN

## 2022-06-12 MED ORDER — ONDANSETRON HCL 4 MG/2ML IJ SOLN
INTRAMUSCULAR | Status: DC | PRN
  Administered 2022-06-12: 4 mg via INTRAVENOUS

## 2022-06-12 MED ORDER — ATROPINE SULFATE 0.4 MG/ML IV SOLN
INTRAVENOUS | Status: AC
  Filled 2022-06-12: qty 1

## 2022-06-12 MED ORDER — MIDAZOLAM HCL 5 MG/5ML IJ SOLN
INTRAMUSCULAR | Status: DC | PRN
  Administered 2022-06-12: 2 mg via INTRAVENOUS

## 2022-06-12 MED ORDER — SODIUM CHLORIDE 0.9% FLUSH: 3.0000 mL | Freq: Two times a day (BID) | INTRAVENOUS | Status: DC

## 2022-06-12 MED ORDER — FENTANYL CITRATE (PF) 100 MCG/2ML IJ SOLN
INTRAMUSCULAR | Status: AC
  Filled 2022-06-12: qty 2

## 2022-06-12 MED ORDER — PHENYLEPHRINE 80 MCG/ML (10ML) SYRINGE FOR IV PUSH (FOR BLOOD PRESSURE SUPPORT)
PREFILLED_SYRINGE | INTRAVENOUS | Status: AC
  Filled 2022-06-12: qty 10

## 2022-06-12 SURGICAL SUPPLY — 75 items
ADH SKN CLS APL DERMABOND .7 (GAUZE/BANDAGES/DRESSINGS)
APL SKNCLS STERI-STRIP NONHPOA (GAUZE/BANDAGES/DRESSINGS)
BAG DECANTER FOR FLEXI CONT (MISCELLANEOUS) IMPLANT
BENZOIN TINCTURE PRP APPL 2/3 (GAUZE/BANDAGES/DRESSINGS) IMPLANT
BLADE HEX COATED 2.75 (ELECTRODE) IMPLANT
BLADE MINI RND TIP GREEN BEAV (BLADE) IMPLANT
BLADE SURG 10 STRL SS (BLADE) IMPLANT
BLADE SURG 15 STRL LF DISP TIS (BLADE) ×1 IMPLANT
BLADE SURG 15 STRL SS (BLADE) ×1
BNDG CMPR 5X3 KNIT ELC UNQ LF (GAUZE/BANDAGES/DRESSINGS)
BNDG CMPR 5X4 CHSV STRCH STRL (GAUZE/BANDAGES/DRESSINGS)
BNDG CMPR 5X4 KNIT ELC UNQ LF (GAUZE/BANDAGES/DRESSINGS)
BNDG CMPR 5X62 HK CLSR LF (GAUZE/BANDAGES/DRESSINGS)
BNDG CMPR 6"X 5 YARDS HK CLSR (GAUZE/BANDAGES/DRESSINGS)
BNDG COHESIVE 4X5 TAN STRL LF (GAUZE/BANDAGES/DRESSINGS) IMPLANT
BNDG ELASTIC 3INX 5YD STR LF (GAUZE/BANDAGES/DRESSINGS) IMPLANT
BNDG ELASTIC 4INX 5YD STR LF (GAUZE/BANDAGES/DRESSINGS) IMPLANT
BNDG ELASTIC 6INX 5YD STR LF (GAUZE/BANDAGES/DRESSINGS) IMPLANT
BNDG GAUZE DERMACEA FLUFF 4 (GAUZE/BANDAGES/DRESSINGS) IMPLANT
BNDG GZE DERMACEA 4 6PLY (GAUZE/BANDAGES/DRESSINGS) ×1
CANISTER SUCT 1200ML W/VALVE (MISCELLANEOUS) IMPLANT
CORD BIPOLAR FORCEPS 12FT (ELECTRODE) IMPLANT
COVER BACK TABLE 60X90IN (DRAPES) ×1 IMPLANT
COVER MAYO STAND STRL (DRAPES) ×1 IMPLANT
DERMABOND ADVANCED .7 DNX12 (GAUZE/BANDAGES/DRESSINGS) IMPLANT
DRAIN PENROSE .5X12 LATEX STL (DRAIN) IMPLANT
DRAPE INCISE IOBAN 66X45 STRL (DRAPES) IMPLANT
DRAPE U-SHAPE 76X120 STRL (DRAPES) IMPLANT
DRSG ADAPTIC 3X8 NADH LF (GAUZE/BANDAGES/DRESSINGS) IMPLANT
DRSG CUTIMED SORBACT 7X9 (GAUZE/BANDAGES/DRESSINGS) IMPLANT
DRSG EMULSION OIL 3X3 NADH (GAUZE/BANDAGES/DRESSINGS) IMPLANT
DRSG VAC GRANUFOAM MED (GAUZE/BANDAGES/DRESSINGS) IMPLANT
ELECT NDL TIP 2.8 STRL (NEEDLE) IMPLANT
ELECT NEEDLE TIP 2.8 STRL (NEEDLE) IMPLANT
ELECT REM PT RETURN 9FT ADLT (ELECTROSURGICAL)
ELECTRODE REM PT RTRN 9FT ADLT (ELECTROSURGICAL) IMPLANT
GAUZE PAD ABD 8X10 STRL (GAUZE/BANDAGES/DRESSINGS) IMPLANT
GAUZE SPONGE 4X4 12PLY STRL (GAUZE/BANDAGES/DRESSINGS) ×1 IMPLANT
GAUZE SPONGE 4X4 12PLY STRL LF (GAUZE/BANDAGES/DRESSINGS) IMPLANT
GFT MATRIX 2 LAYER 10X10 (Graft) ×1 IMPLANT
GLOVE BIO SURGEON STRL SZ 6.5 (GLOVE) ×2 IMPLANT
GOWN STRL REUS W/ TWL LRG LVL3 (GOWN DISPOSABLE) ×2 IMPLANT
GOWN STRL REUS W/TWL LRG LVL3 (GOWN DISPOSABLE) ×2
GRAFT MATRIX 2 LAYER 10X10 (Graft) IMPLANT
NDL HYPO 27GX1-1/4 (NEEDLE) IMPLANT
NEEDLE HYPO 27GX1-1/4 (NEEDLE) IMPLANT
NS IRRIG 1000ML POUR BTL (IV SOLUTION) ×1 IMPLANT
PACK BASIN DAY SURGERY FS (CUSTOM PROCEDURE TRAY) ×1 IMPLANT
PADDING CAST ABS COTTON 3X4 (CAST SUPPLIES) IMPLANT
PADDING CAST ABS COTTON 4X4 ST (CAST SUPPLIES) IMPLANT
PENCIL SMOKE EVACUATOR (MISCELLANEOUS) IMPLANT
SLEEVE SCD COMPRESS KNEE MED (STOCKING) IMPLANT
SPIKE FLUID TRANSFER (MISCELLANEOUS) IMPLANT
SPLINT PLASTER CAST XFAST 3X15 (CAST SUPPLIES) IMPLANT
SPONGE T-LAP 18X18 ~~LOC~~+RFID (SPONGE) ×1 IMPLANT
STAPLER VISISTAT 35W (STAPLE) IMPLANT
STOCKINETTE 4X48 STRL (DRAPES) IMPLANT
STOCKINETTE 6  STRL (DRAPES) ×1
STOCKINETTE 6 STRL (DRAPES) ×1 IMPLANT
STOCKINETTE IMPERVIOUS LG (DRAPES) IMPLANT
SUCTION FRAZIER HANDLE 10FR (MISCELLANEOUS)
SUCTION TUBE FRAZIER 10FR DISP (MISCELLANEOUS) IMPLANT
SURGILUBE 2OZ TUBE FLIPTOP (MISCELLANEOUS) IMPLANT
SUT SILK 3 0 PS 1 (SUTURE) IMPLANT
SUT VIC AB 3-0 FS2 27 (SUTURE) IMPLANT
SUT VIC AB 5-0 P-3 18X BRD (SUTURE) IMPLANT
SUT VIC AB 5-0 P3 18 (SUTURE)
SUT VIC AB 5-0 PS2 18 (SUTURE) IMPLANT
SYR BULB IRRIG 60ML STRL (SYRINGE) IMPLANT
SYR CONTROL 10ML LL (SYRINGE) ×1 IMPLANT
TOWEL GREEN STERILE FF (TOWEL DISPOSABLE) ×1 IMPLANT
TRAY DSU PREP LF (CUSTOM PROCEDURE TRAY) IMPLANT
TUBE CONNECTING 20X1/4 (TUBING) IMPLANT
UNDERPAD 30X36 HEAVY ABSORB (UNDERPADS AND DIAPERS) ×1 IMPLANT
YANKAUER SUCT BULB TIP NO VENT (SUCTIONS) IMPLANT

## 2022-06-12 NOTE — Anesthesia Preprocedure Evaluation (Addendum)
Anesthesia Evaluation  Patient identified by MRN, date of birth, ID band Patient awake    Reviewed: Allergy & Precautions, NPO status , Patient's Chart, lab work & pertinent test results  Airway Mallampati: II  TM Distance: >3 FB Neck ROM: Full    Dental no notable dental hx.    Pulmonary sleep apnea and Continuous Positive Airway Pressure Ventilation , former smoker   Pulmonary exam normal        Cardiovascular negative cardio ROS Normal cardiovascular exam     Neuro/Psych negative neurological ROS  negative psych ROS   GI/Hepatic negative GI ROS, Neg liver ROS,,,  Endo/Other  negative endocrine ROS    Renal/GU negative Renal ROS     Musculoskeletal  (+) Arthritis ,    Abdominal   Peds  Hematology  (+) Blood dyscrasia, anemia   Anesthesia Other Findings Dog bite of left upper extremity  Reproductive/Obstetrics                             Anesthesia Physical Anesthesia Plan  ASA: 2  Anesthesia Plan: General   Post-op Pain Management:    Induction: Intravenous  PONV Risk Score and Plan: 3 and Ondansetron, Dexamethasone, Midazolam and Treatment may vary due to age or medical condition  Airway Management Planned: LMA  Additional Equipment:   Intra-op Plan:   Post-operative Plan: Extubation in OR  Informed Consent: I have reviewed the patients History and Physical, chart, labs and discussed the procedure including the risks, benefits and alternatives for the proposed anesthesia with the patient or authorized representative who has indicated his/her understanding and acceptance.     Dental advisory given  Plan Discussed with: CRNA  Anesthesia Plan Comments:        Anesthesia Quick Evaluation  

## 2022-06-12 NOTE — Interval H&P Note (Signed)
History and Physical Interval Note:  06/12/2022 9:45 AM  April Bowman  has presented today for surgery, with the diagnosis of dog bite left arm.  The various methods of treatment have been discussed with the patient and family. After consideration of risks, benefits and other options for treatment, the patient has consented to  Procedure(s): APPLICATION OF WOUND VAC (Left) as a surgical intervention.  The patient's history has been reviewed, patient examined, no change in status, stable for surgery.  I have reviewed the patient's chart and labs.  Questions were answered to the patient's satisfaction.     Alena Bills Casin Federici

## 2022-06-12 NOTE — Anesthesia Procedure Notes (Signed)
Procedure Name: LMA Insertion Date/Time: 06/12/2022 11:02 AM  Performed by: Ronnette Hila, CRNAPre-anesthesia Checklist: Patient identified, Emergency Drugs available, Suction available and Patient being monitored Patient Re-evaluated:Patient Re-evaluated prior to induction Oxygen Delivery Method: Circle system utilized Preoxygenation: Pre-oxygenation with 100% oxygen Induction Type: IV induction Ventilation: Mask ventilation without difficulty LMA: LMA inserted LMA Size: 4.0 Number of attempts: 1 Airway Equipment and Method: Bite block Placement Confirmation: positive ETCO2 Tube secured with: Tape Dental Injury: Teeth and Oropharynx as per pre-operative assessment

## 2022-06-12 NOTE — Discharge Instructions (Addendum)
Wound Care   Guide to Wound Care  Proper wound care may reduce the risk of infection, improve healing rates, and limit scarring.  This is a general guide to help care for and manage wounds treated with product wound matrix.   Dressing Changes The frequency of dressing changes can vary based on which product was applied, the size of the wound, or the amount of wound drainage. Dressing inspections are recommended, at least weekly.   If you have a Wound VAC it will be changed in one week after the first time it is applied.  Then it will be changed once or twice a week.    Dressing Types Primary Dressing:  Non-adherent dressing goes directly over wounds being treated with the powder or sheet.  Secondary Dressing:  Secures the primary dressing in place and provides extra protection, compression, and absorption.  1. Wash Hands - To help decrease the risk of infection, caregivers should wash their hands for a minimum of 20 seconds and may use medical gloves.   2. Remove the Dressings - Avoid removing product from the wound by carefully removing the applicable dressing(s) at the time points recommended above, or as recommended by the treating physician.  Expected Color and Odor:  It is entirely normal for the wound to have an unpleasant odor and to form a caramel-colored gel as the product absorbs into the wound. It is important to leave this gel on the wound site.  3. Clean the Wound - Use clean water or saline to gently rinse around the wound surface and remove any excess discharge that may be present on the wound. Do not wipe off any of the caramel-colored gel on the wound.   What to look out for:  Large or increased amount of drainage   Surrounding skin has worsening redness or hot to touch   Increased pain in or around the wound   Flu-like symptoms, fatigue, decreased appetite, fever   Hard, crusty wound surface with black or brown coloring  4. Apply New Dressings - Dressings should cover the  entire wound and be suitable for maintaining a moist wound environment.  The non-adherent mesh dressing should be left in place.  New dressing should consist of [place] Jelly to keep the wound moist and soft gauze secured with a wrap or tape.   Maintain a Hydrated Wound Area It is important to keep the wound area moist throughout the healing process. If the wound appears to be dry during dressing changes, select a dressing that will hydrate the wound and maintain that ideal moist environment. If you are unsure what to do, ask the treating physician.  Remodeling Process Every patient heals differently, and no two cases are the same. The size and location of the wound, product type and layering configurations, and general patient health all contribute to how quickly a wound will heal.  While many factors can influence the rate at which the product absorbs, the following can be used as a general guide.   THINGS TO DO: Refrain from smoking High protein diet with plenty of vegetables and some fruit  Limit simple processed carbohydrates and sugar Protect the wound from trauma Protect the dressing  powder       Sheet            Sorbact dressing     Post Anesthesia Home Care Instructions  Activity: Get plenty of rest for the remainder of the day. A responsible individual must stay with you for 24  hours following the procedure.  For the next 24 hours, DO NOT: -Drive a car -Advertising copywriter -Drink alcoholic beverages -Take any medication unless instructed by your physician -Make any legal decisions or sign important papers.  Meals: Start with liquid foods such as gelatin or soup. Progress to regular foods as tolerated. Avoid greasy, spicy, heavy foods. If nausea and/or vomiting occur, drink only clear liquids until the nausea and/or vomiting subsides. Call your physician if vomiting continues.  Special Instructions/Symptoms: Your throat may feel dry or sore from the anesthesia or the breathing  tube placed in your throat during surgery. If this causes discomfort, gargle with warm salt water. The discomfort should disappear within 24 hours.  If you had a scopolamine patch placed behind your ear for the management of post- operative nausea and/or vomiting:  1. The medication in the patch is effective for 72 hours, after which it should be removed.  Wrap patch in a tissue and discard in the trash. Wash hands thoroughly with soap and water. 2. You may remove the patch earlier than 72 hours if you experience unpleasant side effects which may include dry mouth, dizziness or visual disturbances. 3. Avoid touching the patch. Wash your hands with soap and water after contact with the patch.  May take Tylenol after 3pm, if needed.    Post Anesthesia Home Care Instructions  Activity: Get plenty of rest for the remainder of the day. A responsible individual must stay with you for 24 hours following the procedure.  For the next 24 hours, DO NOT: -Drive a car -Advertising copywriter -Drink alcoholic beverages -Take any medication unless instructed by your physician -Make any legal decisions or sign important papers.  Meals: Start with liquid foods such as gelatin or soup. Progress to regular foods as tolerated. Avoid greasy, spicy, heavy foods. If nausea and/or vomiting occur, drink only clear liquids until the nausea and/or vomiting subsides. Call your physician if vomiting continues.  Special Instructions/Symptoms: Your throat may feel dry or sore from the anesthesia or the breathing tube placed in your throat during surgery. If this causes discomfort, gargle with warm salt water. The discomfort should disappear within 24 hours.  If you had a scopolamine patch placed behind your ear for the management of post- operative nausea and/or vomiting:  1. The medication in the patch is effective for 72 hours, after which it should be removed.  Wrap patch in a tissue and discard in the trash. Wash  hands thoroughly with soap and water. 2. You may remove the patch earlier than 72 hours if you experience unpleasant side effects which may include dry mouth, dizziness or visual disturbances. 3. Avoid touching the patch. Wash your hands with soap and water after contact with the patch.

## 2022-06-12 NOTE — Op Note (Signed)
DATE OF OPERATION: 06/12/2022  LOCATION: [facility]  PREOPERATIVE DIAGNOSIS: left arm dog bite / wound  POSTOPERATIVE DIAGNOSIS: Same  PROCEDURE: preparation of 9 x 13 cm left arm dog bite / wound for placement of Myriad 10 x 10 cm sheet and VAC.  SURGEON: Foster Simpson, DO  ASSISTANT: Evelena Leyden, PA  EBL: none  CONDITION: Stable  COMPLICATIONS: None  INDICATION: The patient, Makinsey, is a 67 y.o. female born on Nov 20, 1955, is here for treatment of a severe dog bite to the left arm.Marland Kitchen   PROCEDURE DETAILS:  The patient was seen prior to surgery and marked.  The IV antibiotics were given. The patient was taken to the operating room and given a general anesthetic. A standard time out was performed and all information was confirmed by those in the room. SCDs not placed due to wounds on legs. The left arm was prepped and draped.  The wound was irrigated with saline.  The curette was used to excise the hypergranulation tissue at the edges of the 9 x 13 cm wound.  All of the myriad sheet was applied and secured with the Vicryl.  The sorbact was placed over it with KY gel.  The VAC was applied and there was an excellent seal.  A dressing was placed and the splint placed back on the patient.  The patient was allowed to wake up and taken to recovery room in stable condition at the end of the case. The family was notified at the end of the case.   The advanced practice practitioner (APP) assisted throughout the case.  The APP was essential in retraction and counter traction when needed to make the case progress smoothly.  This retraction and assistance made it possible to see the tissue plans for the procedure.  The assistance was needed for blood control, tissue re-approximation and assisted with closure of the incision site.

## 2022-06-12 NOTE — Transfer of Care (Signed)
Immediate Anesthesia Transfer of Care Note  Patient: [name]  Procedure(s) Performed: APPLICATION OF WOUND VAC (Left: Arm Upper) APPLICATION OF SKIN SUBSTITUTE (Left: Arm Upper)  Patient Location: PACU  Anesthesia Type:General  Level of Consciousness: drowsy  Airway & Oxygen Therapy: Patient connected to face mask oxygen  Post-op Assessment: Report given to RN and Post -op Vital signs reviewed and stable  Post vital signs: Reviewed and stable  Last Vitals:  Vitals Value Taken Time  BP    Temp    Pulse    Resp    SpO2      Last Pain:  Vitals:   06/12/22 0834  TempSrc: Oral  PainSc: 5          Complications: No notable events documented.

## 2022-06-12 NOTE — Anesthesia Postprocedure Evaluation (Signed)
Anesthesia Post Note  Patient: [name] Performed: APPLICATION OF WOUND VAC (Left: Arm Upper) APPLICATION OF SKIN SUBSTITUTE (Left: Arm Upper)     Patient location during evaluation: PACU Anesthesia Type: General Level of consciousness: awake Pain management: pain level controlled Vital Signs Assessment: post-procedure vital signs reviewed and stable Respiratory status: spontaneous breathing, nonlabored ventilation and respiratory function stable Cardiovascular status: blood pressure returned to baseline and stable Postop Assessment: no apparent nausea or vomiting Anesthetic complications: no   No notable events documented.  Last Vitals:  Vitals:   06/12/22 1215 06/12/22 1242  BP: (!) 156/63 133/72  Pulse: 61 66  Resp: 14 16  Temp:  (!) 36.3 C  SpO2: 99% 96%    Last Pain:  Vitals:   06/12/22 1242  TempSrc: Oral  PainSc:                  Arville Postlewaite P Alysah Carton

## 2022-06-13 ENCOUNTER — Encounter (HOSPITAL_BASED_OUTPATIENT_CLINIC_OR_DEPARTMENT_OTHER): Payer: Self-pay | Admitting: Plastic Surgery

## 2022-06-13 DIAGNOSIS — W540XXD Bitten by dog, subsequent encounter: Secondary | ICD-10-CM | POA: Diagnosis not present

## 2022-06-13 DIAGNOSIS — S91011D Laceration without foreign body, right ankle, subsequent encounter: Secondary | ICD-10-CM | POA: Diagnosis not present

## 2022-06-13 DIAGNOSIS — S81012D Laceration without foreign body, left knee, subsequent encounter: Secondary | ICD-10-CM | POA: Diagnosis not present

## 2022-06-13 DIAGNOSIS — Z87891 Personal history of nicotine dependence: Secondary | ICD-10-CM | POA: Diagnosis not present

## 2022-06-13 DIAGNOSIS — G4700 Insomnia, unspecified: Secondary | ICD-10-CM | POA: Diagnosis not present

## 2022-06-13 DIAGNOSIS — S51852D Open bite of left forearm, subsequent encounter: Secondary | ICD-10-CM | POA: Diagnosis not present

## 2022-06-13 DIAGNOSIS — Z9981 Dependence on supplemental oxygen: Secondary | ICD-10-CM | POA: Diagnosis not present

## 2022-06-13 DIAGNOSIS — S51811D Laceration without foreign body of right forearm, subsequent encounter: Secondary | ICD-10-CM | POA: Diagnosis not present

## 2022-06-16 DIAGNOSIS — Z87891 Personal history of nicotine dependence: Secondary | ICD-10-CM | POA: Diagnosis not present

## 2022-06-16 DIAGNOSIS — S51852D Open bite of left forearm, subsequent encounter: Secondary | ICD-10-CM | POA: Diagnosis not present

## 2022-06-16 DIAGNOSIS — S81012D Laceration without foreign body, left knee, subsequent encounter: Secondary | ICD-10-CM | POA: Diagnosis not present

## 2022-06-16 DIAGNOSIS — S91011D Laceration without foreign body, right ankle, subsequent encounter: Secondary | ICD-10-CM | POA: Diagnosis not present

## 2022-06-16 DIAGNOSIS — S51811D Laceration without foreign body of right forearm, subsequent encounter: Secondary | ICD-10-CM | POA: Diagnosis not present

## 2022-06-16 DIAGNOSIS — W540XXD Bitten by dog, subsequent encounter: Secondary | ICD-10-CM | POA: Diagnosis not present

## 2022-06-16 DIAGNOSIS — Z9981 Dependence on supplemental oxygen: Secondary | ICD-10-CM | POA: Diagnosis not present

## 2022-06-16 DIAGNOSIS — G4700 Insomnia, unspecified: Secondary | ICD-10-CM | POA: Diagnosis not present

## 2022-06-17 ENCOUNTER — Encounter: Payer: Self-pay | Admitting: Physician Assistant

## 2022-06-17 ENCOUNTER — Ambulatory Visit (INDEPENDENT_AMBULATORY_CARE_PROVIDER_SITE_OTHER): Payer: Medicare HMO | Admitting: Physician Assistant

## 2022-06-17 VITALS — BP 129/76 | HR 75 | Ht 61.0 in | Wt 138.0 lb

## 2022-06-17 DIAGNOSIS — W540XXD Bitten by dog, subsequent encounter: Secondary | ICD-10-CM | POA: Diagnosis not present

## 2022-06-17 DIAGNOSIS — G4700 Insomnia, unspecified: Secondary | ICD-10-CM | POA: Diagnosis not present

## 2022-06-17 DIAGNOSIS — W540XXS Bitten by dog, sequela: Secondary | ICD-10-CM

## 2022-06-17 DIAGNOSIS — Z9981 Dependence on supplemental oxygen: Secondary | ICD-10-CM | POA: Diagnosis not present

## 2022-06-17 DIAGNOSIS — S41152D Open bite of left upper arm, subsequent encounter: Secondary | ICD-10-CM

## 2022-06-17 DIAGNOSIS — Z87891 Personal history of nicotine dependence: Secondary | ICD-10-CM | POA: Diagnosis not present

## 2022-06-17 DIAGNOSIS — S91011D Laceration without foreign body, right ankle, subsequent encounter: Secondary | ICD-10-CM | POA: Diagnosis not present

## 2022-06-17 DIAGNOSIS — S81012D Laceration without foreign body, left knee, subsequent encounter: Secondary | ICD-10-CM | POA: Diagnosis not present

## 2022-06-17 DIAGNOSIS — S51852D Open bite of left forearm, subsequent encounter: Secondary | ICD-10-CM | POA: Diagnosis not present

## 2022-06-17 DIAGNOSIS — S51811D Laceration without foreign body of right forearm, subsequent encounter: Secondary | ICD-10-CM | POA: Diagnosis not present

## 2022-06-17 NOTE — Progress Notes (Signed)
Patient is a pleasant 67 year old female with LUE wound s/p debridement with wound matrix and wound VAC placement performed 3 times, most recently 06/12/2022 by Dr. Ulice Bold, who presents to clinic for postoperative follow-up.   She called into the on-call service late last evening complaining of a beeping sound stating that the pressures were inadequate.  Concern for leak versus obstruction.  Asked that she come in first thing in the morning for evaluation.  Today, she is accompanied by grandson at bedside.  She tells me that she would periodically have issues with her wound VAC over the course of the past several days, but that she would simply have to turn it off and turn it back on without much complication.  However, last evening around 7 PM she states that the frequency of beeping increased and she became concerned.  She called into [facility] and they informed her that she may have a leak.  She called the on-call service last evening shortly before midnight and was advised to come in to clinic in the a.m. for evaluation.  She also reports that she is bothered by some of the scarring on her right forearm and inquires about scar mitigation treatments.  On exam today, the Ace wrap and Kerlix is taken down.  She did have an area on the most superior lateral aspect of her VAC tape placement where she had been closed proximally in the operating room.  This area was not fully covered with a VAC tape and there was some bloody drainage.  It was dried well and additional VAC tape was placed over the entire upper arm to help create a good seal.  Additional tape was provided elsewhere over the [facility] placement for added security.  The sponge was shrunk down and firm, no obvious failure noted.  125 mmHg noted on the wound VAC, no error messages.  Canister was also replaced and the tube was not at all occluded.  Decision was made not to replace the Lilly pad given likelihood that it was related to the leak rather than Lilly  pad occlusion.  Observed her for an additional 15 to 20 minutes and there was no obvious malfunction or beeping.  The pressure would periodically dip, but quickly returned to 125 mmHg continuous without any failure messages.  As for her right arm scars, recommend that she continue with gentle massage and Vaseline for a week or 2 before she transitions to a silicone scar gel.  Discussed the recent wound VAC change in the OR and informed her that she was making good progress.  May ultimately require STSG rather than wait for healing via secondary intent.  Patient and grandson expressed understanding and is agreeable to the plan.  Will have her keep her appointment for tomorrow in the event that she has any additional concerns or fact failure in the interim.  Also encouraged her to keep the wound VAC unit below her sponge placement at all times.  Picture(s) obtained of the patient and placed in the chart were with the patient's or guardian's permission.

## 2022-06-17 NOTE — Progress Notes (Unsigned)
Patient is a pleasant 66 year old female with LUE wound s/p debridement with wound matrix and wound VAC placement performed 3 times, most recently 06/12/2022 by Dr. Ulice Bold, who presents to clinic for postoperative follow-up.   She was last seen here in clinic 06/17/2022.  At that time, she was concern for leak versus obstruction of her wound VAC.  On evaluation, there did appear to be some drainage/bleeding from the superior lateral aspect of her VAC placement site in an area where primary closure had been performed.  Additional VAC was placed over to achieve a good seal.  Canister was also replaced and the tube was not occluded.  No issues noted on the wound VAC, but asked that she keep the appointment in the event that she experienced frequent beeps and issues regarding the wound VAC.  Today, patient reports that she continues to experience periodic beeping that keeps her from sleeping well.  While she has not noticed any overt drainage or leakage, the machine continues to give her hard time.  She is hopeful for replacement today.    On exam, she functioning at 125 mmHg, no obvious leakage or obstruction.  First attempted to simply remove and replace the Lilly pad.  Upon doing so, achieved good seal for a few minutes, but when running the leak detector it reported an obstruction.  The tubes were unclamped.  After discussing with the patient, determined at this point that we could attempt a complete wound VAC change including sponge given that there is some drainage from around the sponge underneath the tape that could be contributing to the ongoing issues.  She also had an area of mild bleeding near where primary closure was attempted superior to the sponge.  After discussing risks and benefits of completing wound VAC change here in the clinic, patient agreed.  Wound VAC change was completed with some bleeding and discomfort as anticipated.  Overall, patient tolerated the procedure well.  Checked the seal  after completion of the wound VAC change and no leak or obstruction was reported.  Will plan for OR VAC change next week given that it was uncomfortable and provoked bleeding in the clinic.  Discussed with Dr. Ulice Bold who is agreeable.  After next week's VAC change with myriad placement, can likely plan for STSG.

## 2022-06-18 ENCOUNTER — Encounter: Payer: Self-pay | Admitting: Physician Assistant

## 2022-06-18 ENCOUNTER — Encounter: Payer: Medicare HMO | Admitting: Physician Assistant

## 2022-06-18 ENCOUNTER — Telehealth: Payer: Self-pay | Admitting: Plastic Surgery

## 2022-06-18 ENCOUNTER — Ambulatory Visit (INDEPENDENT_AMBULATORY_CARE_PROVIDER_SITE_OTHER): Payer: Medicare HMO | Admitting: Physician Assistant

## 2022-06-18 VITALS — BP 156/80 | HR 64

## 2022-06-18 DIAGNOSIS — W540XXS Bitten by dog, sequela: Secondary | ICD-10-CM

## 2022-06-18 DIAGNOSIS — S41152D Open bite of left upper arm, subsequent encounter: Secondary | ICD-10-CM

## 2022-06-18 DIAGNOSIS — W540XXD Bitten by dog, subsequent encounter: Secondary | ICD-10-CM

## 2022-06-18 NOTE — Telephone Encounter (Signed)
Using an interpreter called and left vmail for the pt to call the office back to sch preop and post ops

## 2022-06-19 DIAGNOSIS — W540XXD Bitten by dog, subsequent encounter: Secondary | ICD-10-CM | POA: Diagnosis not present

## 2022-06-19 DIAGNOSIS — S51852D Open bite of left forearm, subsequent encounter: Secondary | ICD-10-CM | POA: Diagnosis not present

## 2022-06-19 DIAGNOSIS — S51811D Laceration without foreign body of right forearm, subsequent encounter: Secondary | ICD-10-CM | POA: Diagnosis not present

## 2022-06-19 DIAGNOSIS — G4700 Insomnia, unspecified: Secondary | ICD-10-CM | POA: Diagnosis not present

## 2022-06-19 DIAGNOSIS — Z87891 Personal history of nicotine dependence: Secondary | ICD-10-CM | POA: Diagnosis not present

## 2022-06-19 DIAGNOSIS — Z9981 Dependence on supplemental oxygen: Secondary | ICD-10-CM | POA: Diagnosis not present

## 2022-06-19 DIAGNOSIS — S81012D Laceration without foreign body, left knee, subsequent encounter: Secondary | ICD-10-CM | POA: Diagnosis not present

## 2022-06-19 DIAGNOSIS — S91011D Laceration without foreign body, right ankle, subsequent encounter: Secondary | ICD-10-CM | POA: Diagnosis not present

## 2022-06-23 ENCOUNTER — Telehealth: Payer: Self-pay | Admitting: Plastic Surgery

## 2022-06-23 DIAGNOSIS — S51852D Open bite of left forearm, subsequent encounter: Secondary | ICD-10-CM | POA: Diagnosis not present

## 2022-06-23 DIAGNOSIS — S81012D Laceration without foreign body, left knee, subsequent encounter: Secondary | ICD-10-CM | POA: Diagnosis not present

## 2022-06-23 DIAGNOSIS — Z87891 Personal history of nicotine dependence: Secondary | ICD-10-CM | POA: Diagnosis not present

## 2022-06-23 DIAGNOSIS — G4700 Insomnia, unspecified: Secondary | ICD-10-CM | POA: Diagnosis not present

## 2022-06-23 DIAGNOSIS — S91011D Laceration without foreign body, right ankle, subsequent encounter: Secondary | ICD-10-CM | POA: Diagnosis not present

## 2022-06-23 DIAGNOSIS — S51811D Laceration without foreign body of right forearm, subsequent encounter: Secondary | ICD-10-CM | POA: Diagnosis not present

## 2022-06-23 DIAGNOSIS — Z9981 Dependence on supplemental oxygen: Secondary | ICD-10-CM | POA: Diagnosis not present

## 2022-06-23 DIAGNOSIS — W540XXD Bitten by dog, subsequent encounter: Secondary | ICD-10-CM | POA: Diagnosis not present

## 2022-06-23 NOTE — Telephone Encounter (Signed)
Cyndia Diver from [facility] called regarding pt being scheduled for surgery with Dr. Ulice Bold tomorrow, Tues 06/24/2022.  Paula Compton called pt. To go over information and pt. Said she cancelled the surgery last week.  Pt is still on the schedule.  Paula Compton would like for you to call her and let her know what the status is.  Her number is [phone]

## 2022-06-23 NOTE — Progress Notes (Addendum)
Anesthesia Review:  PCP: Cardiologist : Chest x-ray : EKG : Echo : Stress test: Cardiac Cath :  Activity level:  Sleep Study/ CPAP : Fasting Blood Sugar :      / Checks Blood Sugar -- times a day:   Blood Thinner/ Instructions /Last Dose: ASA / Instructions/ Last Dose :    06/23/22 [facility] x 2 one with pt and one with daughter to call [facility] to complete preop appt. Called at 1015am.     Labs done 05/27/22- CBC and BMP- in epic.   Spanish interpreter - requested on 06/23/2022  Called  DR Dillingham and LVMM  and requested orders and any phone numbers they have on file for pt.     Called and left 2nd [facility] for pt with Spanish interpreter at 1335pm.     Called pt again at 1507pm on 06/23/2022 and pt spoke [place] on phone.  PT stated she cancelled the surgery last week .   Called and [facility]MM for Surgery Scheduler at office and informed of above.  Asked for call back to [phone]. Jannifer Franklin charge nurse made aware.   Called at 1534pm and spoke with office personnel and found out that Surgery Scheduler is working from home and has calls forwarded to home.  Receptonist stated she would notify surgery scheduler to make sure she is aware that pt states she cancelled procedure last week and has preop nurse call back number.

## 2022-06-24 ENCOUNTER — Encounter (HOSPITAL_COMMUNITY): Admission: RE | Payer: Self-pay | Source: Home / Self Care

## 2022-06-24 ENCOUNTER — Ambulatory Visit: Payer: Medicare HMO | Admitting: Physician Assistant

## 2022-06-24 ENCOUNTER — Encounter: Payer: Medicare HMO | Admitting: Physician Assistant

## 2022-06-24 ENCOUNTER — Ambulatory Visit (HOSPITAL_COMMUNITY): Admission: RE | Admit: 2022-06-24 | Payer: Medicare HMO | Source: Home / Self Care | Admitting: Plastic Surgery

## 2022-06-24 SURGERY — DEBRIDEMENT, WOUND, WITH CLOSURE
Anesthesia: Choice | Site: Arm Lower | Laterality: Left

## 2022-06-24 NOTE — Telephone Encounter (Signed)
No, I tried calling her on Friday and left her a detailed VM.

## 2022-06-25 ENCOUNTER — Ambulatory Visit (INDEPENDENT_AMBULATORY_CARE_PROVIDER_SITE_OTHER): Payer: Medicare HMO | Admitting: Physician Assistant

## 2022-06-25 ENCOUNTER — Encounter: Payer: Self-pay | Admitting: Physician Assistant

## 2022-06-25 VITALS — BP 131/79 | HR 72

## 2022-06-25 DIAGNOSIS — W540XXD Bitten by dog, subsequent encounter: Secondary | ICD-10-CM | POA: Diagnosis not present

## 2022-06-25 DIAGNOSIS — S41152D Open bite of left upper arm, subsequent encounter: Secondary | ICD-10-CM

## 2022-06-25 DIAGNOSIS — Z719 Counseling, unspecified: Secondary | ICD-10-CM

## 2022-06-25 DIAGNOSIS — W540XXS Bitten by dog, sequela: Secondary | ICD-10-CM

## 2022-06-25 NOTE — Progress Notes (Signed)
This is a 67 year old female with a past medical history of left upper extremity wound status post debridement with application of wound matrix and wound VAC performed 3 times most recently on 06/12/2022 by Dr. Ulice Bold.  She was most recently seen in the office on 06/18/2022.  At that time she had her wound VAC changed.  Since her last office visit she denies any significant complaints or concerns, no signs or symptoms of infection.  She notes some discomfort but no significant pain.  On exam the wound VAC is in place, this was removed removed revealing an approximate 13 x 5.5 cm wound with good granulation tissue, clean wound edges, no surrounding redness or warmth.  I did replace her wound VAC today, it had good suction with no issues or complications.  She did have some minimal bleeding but less than her previous wound VAC change.  She tolerated this very well.   Overall the patient is doing very well she has great granulation tissue no signs of infection and tolerated the wound VAC change well.  I like to see her back in the office in 1 week for repeat evaluation.  She was given strict return precautions.  She verbalized understanding and agreement to today's plan had no further questions or concerns.

## 2022-06-26 ENCOUNTER — Encounter: Payer: Medicare HMO | Admitting: Physician Assistant

## 2022-06-28 DIAGNOSIS — Z0100 Encounter for examination of eyes and vision without abnormal findings: Secondary | ICD-10-CM | POA: Diagnosis not present

## 2022-06-28 DIAGNOSIS — H43393 Other vitreous opacities, bilateral: Secondary | ICD-10-CM | POA: Diagnosis not present

## 2022-06-28 DIAGNOSIS — S41002A Unspecified open wound of left shoulder, initial encounter: Secondary | ICD-10-CM | POA: Diagnosis not present

## 2022-06-28 DIAGNOSIS — T8189XA Other complications of procedures, not elsewhere classified, initial encounter: Secondary | ICD-10-CM | POA: Diagnosis not present

## 2022-06-30 DIAGNOSIS — W540XXD Bitten by dog, subsequent encounter: Secondary | ICD-10-CM | POA: Diagnosis not present

## 2022-06-30 DIAGNOSIS — G4700 Insomnia, unspecified: Secondary | ICD-10-CM | POA: Diagnosis not present

## 2022-06-30 DIAGNOSIS — S81012D Laceration without foreign body, left knee, subsequent encounter: Secondary | ICD-10-CM | POA: Diagnosis not present

## 2022-06-30 DIAGNOSIS — Z87891 Personal history of nicotine dependence: Secondary | ICD-10-CM | POA: Diagnosis not present

## 2022-06-30 DIAGNOSIS — S51852D Open bite of left forearm, subsequent encounter: Secondary | ICD-10-CM | POA: Diagnosis not present

## 2022-06-30 DIAGNOSIS — S51811D Laceration without foreign body of right forearm, subsequent encounter: Secondary | ICD-10-CM | POA: Diagnosis not present

## 2022-06-30 DIAGNOSIS — Z9981 Dependence on supplemental oxygen: Secondary | ICD-10-CM | POA: Diagnosis not present

## 2022-06-30 DIAGNOSIS — S91011D Laceration without foreign body, right ankle, subsequent encounter: Secondary | ICD-10-CM | POA: Diagnosis not present

## 2022-07-03 DIAGNOSIS — W540XXD Bitten by dog, subsequent encounter: Secondary | ICD-10-CM | POA: Diagnosis not present

## 2022-07-03 DIAGNOSIS — S91011D Laceration without foreign body, right ankle, subsequent encounter: Secondary | ICD-10-CM | POA: Diagnosis not present

## 2022-07-03 DIAGNOSIS — S51852D Open bite of left forearm, subsequent encounter: Secondary | ICD-10-CM | POA: Diagnosis not present

## 2022-07-03 DIAGNOSIS — Z9981 Dependence on supplemental oxygen: Secondary | ICD-10-CM | POA: Diagnosis not present

## 2022-07-03 DIAGNOSIS — S51811D Laceration without foreign body of right forearm, subsequent encounter: Secondary | ICD-10-CM | POA: Diagnosis not present

## 2022-07-03 DIAGNOSIS — S81012D Laceration without foreign body, left knee, subsequent encounter: Secondary | ICD-10-CM | POA: Diagnosis not present

## 2022-07-03 DIAGNOSIS — Z87891 Personal history of nicotine dependence: Secondary | ICD-10-CM | POA: Diagnosis not present

## 2022-07-03 DIAGNOSIS — G4700 Insomnia, unspecified: Secondary | ICD-10-CM | POA: Diagnosis not present

## 2022-07-04 ENCOUNTER — Ambulatory Visit (INDEPENDENT_AMBULATORY_CARE_PROVIDER_SITE_OTHER): Payer: Medicare HMO | Admitting: Surgical

## 2022-07-04 ENCOUNTER — Encounter: Payer: Self-pay | Admitting: Surgical

## 2022-07-04 VITALS — BP 151/72 | HR 63 | Ht 61.0 in | Wt 143.6 lb

## 2022-07-04 DIAGNOSIS — W540XXD Bitten by dog, subsequent encounter: Secondary | ICD-10-CM

## 2022-07-04 DIAGNOSIS — S41152D Open bite of left upper arm, subsequent encounter: Secondary | ICD-10-CM | POA: Diagnosis not present

## 2022-07-04 DIAGNOSIS — W540XXS Bitten by dog, sequela: Secondary | ICD-10-CM

## 2022-07-04 NOTE — Progress Notes (Signed)
Patient is a 67 year old female here for follow-up on her left upper extremity wound from a dog bite.  She has underwent multiple debridements and application of wound matrix and wound VAC with Dr. Ulice Bold, most recent debridement on 06/12/2022.  She was last seen in the office on 06/25/2022. At that time, wound was 13 x 5.5 centimeters.  Wound VAC was replaced.  Patient presents today with her grandson, she reports overall she is doing well.  Wound VAC is still in place with good seal noted.  She has some dark sanguinous drainage in the canister.  She reports that she has had some improvement in range of motion of her left hand, and is having some improvement in being able to straighten her left arm.  She has decreased strength in the left upper extremity extending from the shoulder all the way down to her fingers.  She is not having any infectious symptoms.    On exam left arm wound is 12 x 5 cm with 100% granulation tissue within the wound bed.  There is no surrounding erythema or cellulitic changes noted.  She has decreased grip strength to her left hand, she is able to move all 5 digits.  She has a splint for her left wrist due to a comminuted and displaced fracture of the distal ulna.  There is no active purulent drainage or crepitus noted with palpation.  She has minimal tenderness in the left proximal forearm  A/P:  Wound VAC was removed, will start collagen dressing changes every other day.  Will send orders to home health RN for assistance with dressing changes every other day.  Unfortunately she does not feel as if she would be able to change the dressing on her own and does not feel as if she will be able to receive help from family with dressing changes.   Recommend Prisma Promogran collagen applied every other day followed by a small amount of K-Y jelly, 4 x 4 gauze, ABD, Kerlix, Ace wrap..... Recommend initial dressing change on Sunday, 07/06/2022.  We will plan to see her back next  week for reevaluation, we did discuss possibility for split-thickness skin graft for final coverage of the wound, however will discuss this with Dr. Ulice Bold and discussed with patient further next week.

## 2022-07-07 DIAGNOSIS — S91011D Laceration without foreign body, right ankle, subsequent encounter: Secondary | ICD-10-CM | POA: Diagnosis not present

## 2022-07-07 DIAGNOSIS — W540XXD Bitten by dog, subsequent encounter: Secondary | ICD-10-CM | POA: Diagnosis not present

## 2022-07-07 DIAGNOSIS — S51811D Laceration without foreign body of right forearm, subsequent encounter: Secondary | ICD-10-CM | POA: Diagnosis not present

## 2022-07-07 DIAGNOSIS — Z87891 Personal history of nicotine dependence: Secondary | ICD-10-CM | POA: Diagnosis not present

## 2022-07-07 DIAGNOSIS — S81012D Laceration without foreign body, left knee, subsequent encounter: Secondary | ICD-10-CM | POA: Diagnosis not present

## 2022-07-07 DIAGNOSIS — Z9981 Dependence on supplemental oxygen: Secondary | ICD-10-CM | POA: Diagnosis not present

## 2022-07-07 DIAGNOSIS — G4700 Insomnia, unspecified: Secondary | ICD-10-CM | POA: Diagnosis not present

## 2022-07-07 DIAGNOSIS — S51852D Open bite of left forearm, subsequent encounter: Secondary | ICD-10-CM | POA: Diagnosis not present

## 2022-07-08 ENCOUNTER — Telehealth: Payer: Self-pay | Admitting: *Deleted

## 2022-07-08 NOTE — Telephone Encounter (Addendum)
Called and [facility] @ 10:20am) with Joya San @ [facility].  Regarding wound dressing changes for the patient.//AB/CMA   Called and spoke with the patient and informed her that I reached out to [facility] and left her a message asking her to give me a call back regarding wound dressing changing.   Patient stated that the [facility] not work on Saturday.  What would we like for her to do regarding changing the dressings.  Informed the patient that I spoke with Matthew,PA-C,and he stated that it would be okay to change the dressing every other day.  Informed the patient that I will give [facility] a call and let her know that I will cancel her dressing change for Saturday, and for her to come on Sunday.  Patient verbalized understanding and agreed.  Called and LMOM @ 10:20 am with [facility] regarding wound changes for the patient.  Called @ 4:09 pm and LMOM informing Valery @ [facility] of the new wound dressing changes.  (Per Matthew:Dressing changing every other day starting Sunday.  Will need to cancel the patient's dressing change for Saturday.    Dressing changes instructions:Apply collagen to wound bed,cover with small amount of KY jelly,4x4 gauze,ABD,kerlix,ace wrap,wrap from wrist.//AB/CMA

## 2022-07-09 DIAGNOSIS — F5104 Psychophysiologic insomnia: Secondary | ICD-10-CM | POA: Diagnosis not present

## 2022-07-09 DIAGNOSIS — G4733 Obstructive sleep apnea (adult) (pediatric): Secondary | ICD-10-CM | POA: Diagnosis not present

## 2022-07-10 DIAGNOSIS — Z87891 Personal history of nicotine dependence: Secondary | ICD-10-CM | POA: Diagnosis not present

## 2022-07-10 DIAGNOSIS — S51811D Laceration without foreign body of right forearm, subsequent encounter: Secondary | ICD-10-CM | POA: Diagnosis not present

## 2022-07-10 DIAGNOSIS — G4700 Insomnia, unspecified: Secondary | ICD-10-CM | POA: Diagnosis not present

## 2022-07-10 DIAGNOSIS — S81012D Laceration without foreign body, left knee, subsequent encounter: Secondary | ICD-10-CM | POA: Diagnosis not present

## 2022-07-10 DIAGNOSIS — S91011D Laceration without foreign body, right ankle, subsequent encounter: Secondary | ICD-10-CM | POA: Diagnosis not present

## 2022-07-10 DIAGNOSIS — Z9981 Dependence on supplemental oxygen: Secondary | ICD-10-CM | POA: Diagnosis not present

## 2022-07-10 DIAGNOSIS — W540XXD Bitten by dog, subsequent encounter: Secondary | ICD-10-CM | POA: Diagnosis not present

## 2022-07-10 DIAGNOSIS — S51852D Open bite of left forearm, subsequent encounter: Secondary | ICD-10-CM | POA: Diagnosis not present

## 2022-07-11 ENCOUNTER — Ambulatory Visit (INDEPENDENT_AMBULATORY_CARE_PROVIDER_SITE_OTHER): Payer: Medicare HMO | Admitting: Physician Assistant

## 2022-07-11 VITALS — BP 139/79 | HR 77

## 2022-07-11 DIAGNOSIS — W540XXS Bitten by dog, sequela: Secondary | ICD-10-CM

## 2022-07-11 DIAGNOSIS — W540XXD Bitten by dog, subsequent encounter: Secondary | ICD-10-CM

## 2022-07-11 DIAGNOSIS — S41152D Open bite of left upper arm, subsequent encounter: Secondary | ICD-10-CM

## 2022-07-11 MED ORDER — NAPROXEN 500 MG PO TABS: 500.0000 mg | ORAL_TABLET | Freq: Two times a day (BID) | ORAL | 0 refills | Status: DC | PRN

## 2022-07-11 MED ORDER — OXYCODONE HCL 5 MG PO TABS: 5.0000 mg | ORAL_TABLET | Freq: Four times a day (QID) | ORAL | 0 refills | Status: AC | PRN

## 2022-07-11 MED ORDER — CEPHALEXIN 500 MG PO CAPS: 500.0000 mg | ORAL_CAPSULE | Freq: Four times a day (QID) | ORAL | 0 refills | Status: AC

## 2022-07-11 NOTE — Progress Notes (Signed)
Patient is a pleasant 67 year old female with LUE wound s/p debridement with wound matrix and wound VAC placement performed 3 times, most recently 06/12/2022 by Dr. Ulice Bold, who presents to clinic for postoperative follow-up.   She was last seen here in clinic on 07/04/2022.  At that time, wound VAC was removed revealing 12 x 5 cm wound with excellent granular tissue at the base.  Decreased grip strength and splint in place for comminuted displaced distal ulnar fracture.  Discussed transition to Promogran collagen to be applied daily followed by small amount of K-Y jelly, gauze, ABD, Kerlix, and Ace wrap.  Return in 1 week for reevaluation as well as preoperative encounter for STSG.  Today, patient reports that she has been putting on the collagen and changing it every other day.  Last dressing change was yesterday.  She reports that she has some discomfort at the base of her left thumb described as burning.  She also reports that her hand has been swollen.  Denies any fevers, malodor, drainage, or other concerns.  Pain is well-controlled with naproxen.  On exam, her wrap began proximal to the wrist.  Hand is swollen, particularly over dorsum of palm.  Radial pulse intact.  Sensation intact throughout.  Limited extension of digits, likely due to swelling.  Wrap is removed completely and the wound appears to be healing nicely.  Unincorporated collagen noted at the base of the wound.  Will leave in place.  Photo obtained and placed in chart.  No evidence concerning for infection.  Applied the Kerlix starting at the MCPs and worked up to the elbow.  Did the same with the Ace wrap.  Informed her to wrap at the hand moving forward to help mitigate swelling.  She has an appointment with orthopedics at the beginning of next week.  Will defer to them for care or advice related to her distal ulnar fracture.  Discussed upcoming surgery.  Patient voices understanding and is agreeable to the plan.  Medical  interpreter at bedside assisting with interpretation.  Picture(s) obtained of the patient and placed in the chart were with the patient's or guardian's permission.

## 2022-07-11 NOTE — Progress Notes (Signed)
Patient ID: April Bowman, female    DOB: October 08, 1955, 67 y.o.   MRN: [id number]  Chief Complaint  Patient presents with   Pre-op Exam      ICD-10-CM   1. Dog bite of left upper extremity, sequela  S41.152S    W54.0XXS        History of Present Illness: April Bowman is a 67 y.o.  female  with a history of LUE wound s/p debridement with wound matrix and wound VAC placement.  She presents for preoperative evaluation for upcoming procedure, split-thickness skin graft, scheduled for 07/28/2022 with Dr. Ulice Bold.  The patient has not had problems with anesthesia.  Previous surgeries without complication or difficulty.  She denies any personal or family history of blood clots or clotting disorder.  She is not on any blood thinners.  Denies any significant cardiac or pulmonary disease.  She is a non-smoker.  She does endorse OSA at home with CPAP.  Scattered varicosities.  Discussed expectations regarding surgery.  Patient voices understanding and is agreeable to the plan.  Medical interpreter at bedside to assist with conversation.  Summary of Previous Visit: She was last seen here in clinic on 07/04/2022.  At that time, excellent granulation tissue noted throughout.  The assessing provider discussed care with surgeon and agreed upon moving forward with STSG.  Notified patient of plans.  PMH Significant for: Traumatic dog bite left upper extremity with subsequent wound matrix and VAC placement, OSA.   Past Medical History: Allergies: No Known Allergies  Current Medications:  Current Outpatient Medications:    acetaminophen (TYLENOL) 500 MG tablet, Take 2 tablets (1,000 mg total) by mouth every 6 (six) hours as needed for mild pain or fever., Disp: , Rfl:    ASCORBIC ACID PO, Take 1 tablet by mouth daily. Vitamin C, unknown strength., Disp: , Rfl:    cephALEXin (KEFLEX) 500 MG capsule, Take 1 capsule (500 mg total) by mouth 4 (four) times daily for 3 days., Disp: 12 capsule, Rfl: 0    Cholecalciferol (VITAMIN D-3 PO), Take 1 capsule by mouth daily., Disp: , Rfl:    Cyanocobalamin (VITAMIN B-12 PO), Take 1 tablet by mouth daily., Disp: , Rfl:    estradiol (ESTRACE VAGINAL) 0.1 MG/GM vaginal cream, Place 1 g vaginally at bedtime., Disp: , Rfl:    hydrOXYzine (ATARAX) 25 MG tablet, Take 1 tablet (25 mg total) by mouth 3 (three) times daily as needed for anxiety., Disp: 30 tablet, Rfl: 0   MAGNESIUM OXIDE PO, Take 1 tablet by mouth daily., Disp: , Rfl:    methocarbamol (ROBAXIN) 500 MG tablet, Take 2 tablets (1,000 mg total) by mouth 3 (three) times daily., Disp: 30 tablet, Rfl: 0   MORINGA OLEIFERA PO, Take 1 capsule by mouth daily., Disp: , Rfl:    OVER THE COUNTER MEDICATION, Take 1 tablet by mouth daily. Vitamin A + Vitamin D3 combination supplement., Disp: , Rfl:    oxyCODONE (ROXICODONE) 5 MG immediate release tablet, Take 1 tablet (5 mg total) by mouth every 6 (six) hours as needed for up to 5 days for severe pain., Disp: 20 tablet, Rfl: 0   POTASSIUM GLUCONATE PO, Take 1 tablet by mouth daily., Disp: , Rfl:    QUEtiapine (SEROQUEL) 100 MG tablet, Take 100 mg by mouth at bedtime., Disp: , Rfl:    tiZANidine (ZANAFLEX) 4 MG capsule, Take 4 mg by mouth every 6 (six) hours as needed for muscle spasms., Disp: , Rfl:    traZODone (  DESYREL) 50 MG tablet, Take 50 mg by mouth at bedtime as needed for sleep., Disp: , Rfl:    VITAMIN E PO, Take 1 capsule by mouth daily., Disp: , Rfl:    gabapentin (NEURONTIN) 300 MG capsule, Take 1 capsule (300 mg total) by mouth 3 (three) times daily., Disp: 90 capsule, Rfl: 0   naproxen (NAPROSYN) 500 MG tablet, Take 1 tablet (500 mg total) by mouth 2 (two) times daily as needed for moderate pain., Disp: 60 tablet, Rfl: 0  Past Medical Problems: Past Medical History:  Diagnosis Date   Arthritis    Sleep apnea     Past Surgical History: Past Surgical History:  Procedure Laterality Date   APPLICATION OF WOUND VAC Left 06/04/2022   Procedure:  APPLICATION OF WOUND VAC;  Surgeon: Peggye Form, DO;  Location: [facility];  Service: Plastics;  Laterality: Left;   APPLICATION OF WOUND VAC Left 06/12/2022   Procedure: APPLICATION OF WOUND VAC;  Surgeon: Peggye Form, DO;  Location: [facility];  Service: Plastics;  Laterality: Left;   AUGMENTATION MAMMAPLASTY     DEBRIDEMENT AND CLOSURE WOUND Left 05/26/2022   Procedure: ARM DEBRIDEMENT WITH MYRIAD PLACEMENT AND CHANGE WOUND VAC;  Surgeon: Peggye Form, DO;  Location: [facility];  Service: Plastics;  Laterality: Left;   DEBRIDEMENT AND CLOSURE WOUND Left 06/04/2022   Procedure: ARM DEBRIDEMENT WITH MYRIAD PLACEMENT, wound vac change;  Surgeon: Peggye Form, DO;  Location: [facility];  Service: Plastics;  Laterality: Left;   hip replacements Bilateral    INCISION AND DRAINAGE OF WOUND Left 05/20/2022   Procedure: IRRIGATION AND DEBRIDEMENT AND WOUND VAC PLACEMENT LEFT ARM;  Surgeon: Ramon Dredge, MD;  Location: [facility];  Service: Orthopedics;  Laterality: Left;   MINOR IRRIGATION AND DEBRIDEMENT OF WOUND Right 05/20/2022   Procedure: IRRIGATION AND DEBRIDEMENT OF WOUND RIGHT ARM;  Surgeon: Ramon Dredge, MD;  Location: [facility];  Service: Orthopedics;  Laterality: Right;    Social History: Social History   Socioeconomic History   Marital status: Widowed    Spouse name: Not on file   Number of children: Not on file   Years of education: Not on file   Highest education level: Not on file  Occupational History   Not on file  Tobacco Use   Smoking status: Former    Packs/day: 0.50    Years: 35.00    Additional pack years: 0.00    Total pack years: 17.50    Types: Cigarettes    Quit date: 04/03/2010    Years since quitting: 12.2   Smokeless tobacco: Never  Substance and Sexual Activity   Alcohol use: Yes    Comment: rarely   Drug use: Never   Sexual activity: Not on file  Other Topics Concern    Not on file  Social History Narrative   Not on file   Social Determinants of Health   Financial Resource Strain: Not on file  Food Insecurity: No Food Insecurity (05/21/2022)   Hunger Vital Sign    Worried About Running Out of Food in the Last Year: Never true    Ran Out of Food in the Last Year: Never true  Transportation Needs: No Transportation Needs (05/21/2022)   PRAPARE - Administrator, Civil Service (Medical): No    Lack of Transportation (Non-Medical): No  Physical Activity: Not on file  Stress: Not on file  Social Connections: Not on file  Intimate Partner Violence: Not At Risk (05/21/2022)   Humiliation, Afraid, Rape, and Kick questionnaire    Fear of Current or Ex-Partner: No    Emotionally Abused: No    Physically Abused: No    Sexually Abused: No    Family History: Family History  Problem Relation Age of Onset   Breast cancer Neg Hx     Review of Systems: ROS Denies any recent chest pain, difficulty breathing, leg swelling, fevers.  Physical Exam: Vital Signs BP 139/79 (BP Location: Right Arm, Patient Position: Sitting, Cuff Size: Small)   Pulse 77   SpO2 98%   Physical Exam Constitutional:      General: Not in acute distress.    Appearance: Normal appearance. Not ill-appearing.  HENT:     Head: Normocephalic and atraumatic.  Eyes:     Pupils: Pupils are equal, round. Cardiovascular:     Rate and Rhythm: Normal rate.    Pulses: Normal pulses.  Pulmonary:     Effort: No respiratory distress or increased work of breathing.  Speaks in full sentences. Abdominal:     General: Abdomen is flat. No distension.   Musculoskeletal: Normal range of motion. No lower extremity swelling or edema.  Scattered varicosities. Skin:    General: Skin is warm and dry.     Findings: No erythema or rash.  Neurological:     Mental Status: Alert and oriented to person, place, and time.  Psychiatric:        Mood and Affect: Mood normal.        Behavior:  Behavior normal.    Assessment/Plan: The patient is scheduled for STSG left upper extremity wound with Dr. Ulice Bold.  Risks, benefits, and alternatives of procedure discussed, questions answered and consent obtained.    Smoking Status: Non-smoker.  Caprini Score: 6; Risk Factors include: Age, BMI greater than 25, varicosities, and length of planned surgery. Recommendation for mechanical prophylaxis. Encourage early ambulation.   Pictures obtained: Today.  Post-op Rx sent to pharmacy: Naproxen, oxycodone, Keflex.  She states that she does not require Zofran for nausea.  Patient was provided with the General Surgical Risk consent document and Pain Medication Agreement prior to their appointment.  They had adequate time to read through the risk consent documents and Pain Medication Agreement. We also discussed them in person together during this preop appointment. All of their questions were answered to their satisfaction.  Recommended calling if they have any further questions.  Risk consent form and Pain Medication Agreement to be scanned into patient's chart.    Electronically signed by: Evelena Leyden, PA-C 07/11/2022 3:55 PM

## 2022-07-11 NOTE — H&P (View-Only) (Signed)
Patient ID: April Bowman, female    DOB: October 08, 1955, 67 y.o.   MRN: [id number]  Chief Complaint  Patient presents with   Pre-op Exam      ICD-10-CM   1. Dog bite of left upper extremity, sequela  S41.152S    W54.0XXS        History of Present Illness: April Bowman is a 67 y.o.  female  with a history of LUE wound s/p debridement with wound matrix and wound VAC placement.  She presents for preoperative evaluation for upcoming procedure, split-thickness skin graft, scheduled for 07/28/2022 with Dr. Ulice Bold.  The patient has not had problems with anesthesia.  Previous surgeries without complication or difficulty.  She denies any personal or family history of blood clots or clotting disorder.  She is not on any blood thinners.  Denies any significant cardiac or pulmonary disease.  She is a non-smoker.  She does endorse OSA at home with CPAP.  Scattered varicosities.  Discussed expectations regarding surgery.  Patient voices understanding and is agreeable to the plan.  Medical interpreter at bedside to assist with conversation.  Summary of Previous Visit: She was last seen here in clinic on 07/04/2022.  At that time, excellent granulation tissue noted throughout.  The assessing provider discussed care with surgeon and agreed upon moving forward with STSG.  Notified patient of plans.  PMH Significant for: Traumatic dog bite left upper extremity with subsequent wound matrix and VAC placement, OSA.   Past Medical History: Allergies: No Known Allergies  Current Medications:  Current Outpatient Medications:    acetaminophen (TYLENOL) 500 MG tablet, Take 2 tablets (1,000 mg total) by mouth every 6 (six) hours as needed for mild pain or fever., Disp: , Rfl:    ASCORBIC ACID PO, Take 1 tablet by mouth daily. Vitamin C, unknown strength., Disp: , Rfl:    cephALEXin (KEFLEX) 500 MG capsule, Take 1 capsule (500 mg total) by mouth 4 (four) times daily for 3 days., Disp: 12 capsule, Rfl: 0    Cholecalciferol (VITAMIN D-3 PO), Take 1 capsule by mouth daily., Disp: , Rfl:    Cyanocobalamin (VITAMIN B-12 PO), Take 1 tablet by mouth daily., Disp: , Rfl:    estradiol (ESTRACE VAGINAL) 0.1 MG/GM vaginal cream, Place 1 g vaginally at bedtime., Disp: , Rfl:    hydrOXYzine (ATARAX) 25 MG tablet, Take 1 tablet (25 mg total) by mouth 3 (three) times daily as needed for anxiety., Disp: 30 tablet, Rfl: 0   MAGNESIUM OXIDE PO, Take 1 tablet by mouth daily., Disp: , Rfl:    methocarbamol (ROBAXIN) 500 MG tablet, Take 2 tablets (1,000 mg total) by mouth 3 (three) times daily., Disp: 30 tablet, Rfl: 0   MORINGA OLEIFERA PO, Take 1 capsule by mouth daily., Disp: , Rfl:    OVER THE COUNTER MEDICATION, Take 1 tablet by mouth daily. Vitamin A + Vitamin D3 combination supplement., Disp: , Rfl:    oxyCODONE (ROXICODONE) 5 MG immediate release tablet, Take 1 tablet (5 mg total) by mouth every 6 (six) hours as needed for up to 5 days for severe pain., Disp: 20 tablet, Rfl: 0   POTASSIUM GLUCONATE PO, Take 1 tablet by mouth daily., Disp: , Rfl:    QUEtiapine (SEROQUEL) 100 MG tablet, Take 100 mg by mouth at bedtime., Disp: , Rfl:    tiZANidine (ZANAFLEX) 4 MG capsule, Take 4 mg by mouth every 6 (six) hours as needed for muscle spasms., Disp: , Rfl:    traZODone (  DESYREL) 50 MG tablet, Take 50 mg by mouth at bedtime as needed for sleep., Disp: , Rfl:    VITAMIN E PO, Take 1 capsule by mouth daily., Disp: , Rfl:    gabapentin (NEURONTIN) 300 MG capsule, Take 1 capsule (300 mg total) by mouth 3 (three) times daily., Disp: 90 capsule, Rfl: 0   naproxen (NAPROSYN) 500 MG tablet, Take 1 tablet (500 mg total) by mouth 2 (two) times daily as needed for moderate pain., Disp: 60 tablet, Rfl: 0  Past Medical Problems: Past Medical History:  Diagnosis Date   Arthritis    Sleep apnea     Past Surgical History: Past Surgical History:  Procedure Laterality Date   APPLICATION OF WOUND VAC Left 06/04/2022   Procedure:  APPLICATION OF WOUND VAC;  Surgeon: Peggye Form, DO;  Location: [facility];  Service: Plastics;  Laterality: Left;   APPLICATION OF WOUND VAC Left 06/12/2022   Procedure: APPLICATION OF WOUND VAC;  Surgeon: Peggye Form, DO;  Location: [facility];  Service: Plastics;  Laterality: Left;   AUGMENTATION MAMMAPLASTY     DEBRIDEMENT AND CLOSURE WOUND Left 05/26/2022   Procedure: ARM DEBRIDEMENT WITH MYRIAD PLACEMENT AND CHANGE WOUND VAC;  Surgeon: Peggye Form, DO;  Location: [facility];  Service: Plastics;  Laterality: Left;   DEBRIDEMENT AND CLOSURE WOUND Left 06/04/2022   Procedure: ARM DEBRIDEMENT WITH MYRIAD PLACEMENT, wound vac change;  Surgeon: Peggye Form, DO;  Location: [facility];  Service: Plastics;  Laterality: Left;   hip replacements Bilateral    INCISION AND DRAINAGE OF WOUND Left 05/20/2022   Procedure: IRRIGATION AND DEBRIDEMENT AND WOUND VAC PLACEMENT LEFT ARM;  Surgeon: Ramon Dredge, MD;  Location: [facility];  Service: Orthopedics;  Laterality: Left;   MINOR IRRIGATION AND DEBRIDEMENT OF WOUND Right 05/20/2022   Procedure: IRRIGATION AND DEBRIDEMENT OF WOUND RIGHT ARM;  Surgeon: Ramon Dredge, MD;  Location: [facility];  Service: Orthopedics;  Laterality: Right;    Social History: Social History   Socioeconomic History   Marital status: Widowed    Spouse name: Not on file   Number of children: Not on file   Years of education: Not on file   Highest education level: Not on file  Occupational History   Not on file  Tobacco Use   Smoking status: Former    Packs/day: 0.50    Years: 35.00    Additional pack years: 0.00    Total pack years: 17.50    Types: Cigarettes    Quit date: 04/03/2010    Years since quitting: 12.2   Smokeless tobacco: Never  Substance and Sexual Activity   Alcohol use: Yes    Comment: rarely   Drug use: Never   Sexual activity: Not on file  Other Topics Concern    Not on file  Social History Narrative   Not on file   Social Determinants of Health   Financial Resource Strain: Not on file  Food Insecurity: No Food Insecurity (05/21/2022)   Hunger Vital Sign    Worried About Running Out of Food in the Last Year: Never true    Ran Out of Food in the Last Year: Never true  Transportation Needs: No Transportation Needs (05/21/2022)   PRAPARE - Administrator, Civil Service (Medical): No    Lack of Transportation (Non-Medical): No  Physical Activity: Not on file  Stress: Not on file  Social Connections: Not on file  Intimate Partner Violence: Not At Risk (05/21/2022)   Humiliation, Afraid, Rape, and Kick questionnaire    Fear of Current or Ex-Partner: No    Emotionally Abused: No    Physically Abused: No    Sexually Abused: No    Family History: Family History  Problem Relation Age of Onset   Breast cancer Neg Hx     Review of Systems: ROS Denies any recent chest pain, difficulty breathing, leg swelling, fevers.  Physical Exam: Vital Signs BP 139/79 (BP Location: Right Arm, Patient Position: Sitting, Cuff Size: Small)   Pulse 77   SpO2 98%   Physical Exam Constitutional:      General: Not in acute distress.    Appearance: Normal appearance. Not ill-appearing.  HENT:     Head: Normocephalic and atraumatic.  Eyes:     Pupils: Pupils are equal, round. Cardiovascular:     Rate and Rhythm: Normal rate.    Pulses: Normal pulses.  Pulmonary:     Effort: No respiratory distress or increased work of breathing.  Speaks in full sentences. Abdominal:     General: Abdomen is flat. No distension.   Musculoskeletal: Normal range of motion. No lower extremity swelling or edema.  Scattered varicosities. Skin:    General: Skin is warm and dry.     Findings: No erythema or rash.  Neurological:     Mental Status: Alert and oriented to person, place, and time.  Psychiatric:        Mood and Affect: Mood normal.        Behavior:  Behavior normal.    Assessment/Plan: The patient is scheduled for STSG left upper extremity wound with Dr. Ulice Bold.  Risks, benefits, and alternatives of procedure discussed, questions answered and consent obtained.    Smoking Status: Non-smoker.  Caprini Score: 6; Risk Factors include: Age, BMI greater than 25, varicosities, and length of planned surgery. Recommendation for mechanical prophylaxis. Encourage early ambulation.   Pictures obtained: Today.  Post-op Rx sent to pharmacy: Naproxen, oxycodone, Keflex.  She states that she does not require Zofran for nausea.  Patient was provided with the General Surgical Risk consent document and Pain Medication Agreement prior to their appointment.  They had adequate time to read through the risk consent documents and Pain Medication Agreement. We also discussed them in person together during this preop appointment. All of their questions were answered to their satisfaction.  Recommended calling if they have any further questions.  Risk consent form and Pain Medication Agreement to be scanned into patient's chart.    Electronically signed by: Evelena Leyden, PA-C 07/11/2022 3:55 PM

## 2022-07-14 DIAGNOSIS — G4700 Insomnia, unspecified: Secondary | ICD-10-CM | POA: Diagnosis not present

## 2022-07-14 DIAGNOSIS — W540XXD Bitten by dog, subsequent encounter: Secondary | ICD-10-CM | POA: Diagnosis not present

## 2022-07-14 DIAGNOSIS — S91011D Laceration without foreign body, right ankle, subsequent encounter: Secondary | ICD-10-CM | POA: Diagnosis not present

## 2022-07-14 DIAGNOSIS — S81012D Laceration without foreign body, left knee, subsequent encounter: Secondary | ICD-10-CM | POA: Diagnosis not present

## 2022-07-14 DIAGNOSIS — S51811D Laceration without foreign body of right forearm, subsequent encounter: Secondary | ICD-10-CM | POA: Diagnosis not present

## 2022-07-14 DIAGNOSIS — S51852D Open bite of left forearm, subsequent encounter: Secondary | ICD-10-CM | POA: Diagnosis not present

## 2022-07-14 DIAGNOSIS — Z87891 Personal history of nicotine dependence: Secondary | ICD-10-CM | POA: Diagnosis not present

## 2022-07-14 DIAGNOSIS — Z9981 Dependence on supplemental oxygen: Secondary | ICD-10-CM | POA: Diagnosis not present

## 2022-07-16 DIAGNOSIS — S51811D Laceration without foreign body of right forearm, subsequent encounter: Secondary | ICD-10-CM | POA: Diagnosis not present

## 2022-07-16 DIAGNOSIS — Z87891 Personal history of nicotine dependence: Secondary | ICD-10-CM | POA: Diagnosis not present

## 2022-07-16 DIAGNOSIS — S81012D Laceration without foreign body, left knee, subsequent encounter: Secondary | ICD-10-CM | POA: Diagnosis not present

## 2022-07-16 DIAGNOSIS — G4700 Insomnia, unspecified: Secondary | ICD-10-CM | POA: Diagnosis not present

## 2022-07-16 DIAGNOSIS — Z9981 Dependence on supplemental oxygen: Secondary | ICD-10-CM | POA: Diagnosis not present

## 2022-07-16 DIAGNOSIS — S51852D Open bite of left forearm, subsequent encounter: Secondary | ICD-10-CM | POA: Diagnosis not present

## 2022-07-16 DIAGNOSIS — S91011D Laceration without foreign body, right ankle, subsequent encounter: Secondary | ICD-10-CM | POA: Diagnosis not present

## 2022-07-16 DIAGNOSIS — W540XXD Bitten by dog, subsequent encounter: Secondary | ICD-10-CM | POA: Diagnosis not present

## 2022-07-16 NOTE — Telephone Encounter (Signed)
I spoke with patient. She was letting us know that her [facility] has been difficult getting to her for her dressing changes.  It is all squared away, but she will let us know if she has any further difficulty and needs our assistance here in the office.

## 2022-07-16 NOTE — Telephone Encounter (Signed)
Patient called back and had some questions about her wound dressing. She said something about there being issues with Mondays but I wasn't sure what she meant by it, we were having trouble understanding each other. She asked for a call back from [place]. Please advise

## 2022-07-18 DIAGNOSIS — G4700 Insomnia, unspecified: Secondary | ICD-10-CM | POA: Diagnosis not present

## 2022-07-18 DIAGNOSIS — S51811D Laceration without foreign body of right forearm, subsequent encounter: Secondary | ICD-10-CM | POA: Diagnosis not present

## 2022-07-18 DIAGNOSIS — Z87891 Personal history of nicotine dependence: Secondary | ICD-10-CM | POA: Diagnosis not present

## 2022-07-18 DIAGNOSIS — S91011D Laceration without foreign body, right ankle, subsequent encounter: Secondary | ICD-10-CM | POA: Diagnosis not present

## 2022-07-18 DIAGNOSIS — W540XXD Bitten by dog, subsequent encounter: Secondary | ICD-10-CM | POA: Diagnosis not present

## 2022-07-18 DIAGNOSIS — S81012D Laceration without foreign body, left knee, subsequent encounter: Secondary | ICD-10-CM | POA: Diagnosis not present

## 2022-07-18 DIAGNOSIS — Z9981 Dependence on supplemental oxygen: Secondary | ICD-10-CM | POA: Diagnosis not present

## 2022-07-18 DIAGNOSIS — S51852D Open bite of left forearm, subsequent encounter: Secondary | ICD-10-CM | POA: Diagnosis not present

## 2022-07-21 ENCOUNTER — Encounter (HOSPITAL_BASED_OUTPATIENT_CLINIC_OR_DEPARTMENT_OTHER): Payer: Self-pay | Admitting: Plastic Surgery

## 2022-07-21 ENCOUNTER — Other Ambulatory Visit: Payer: Self-pay

## 2022-07-21 DIAGNOSIS — S91011D Laceration without foreign body, right ankle, subsequent encounter: Secondary | ICD-10-CM | POA: Diagnosis not present

## 2022-07-21 DIAGNOSIS — W540XXD Bitten by dog, subsequent encounter: Secondary | ICD-10-CM | POA: Diagnosis not present

## 2022-07-21 DIAGNOSIS — S51811D Laceration without foreign body of right forearm, subsequent encounter: Secondary | ICD-10-CM | POA: Diagnosis not present

## 2022-07-21 DIAGNOSIS — G4700 Insomnia, unspecified: Secondary | ICD-10-CM | POA: Diagnosis not present

## 2022-07-21 DIAGNOSIS — S51852D Open bite of left forearm, subsequent encounter: Secondary | ICD-10-CM | POA: Diagnosis not present

## 2022-07-21 DIAGNOSIS — Z87891 Personal history of nicotine dependence: Secondary | ICD-10-CM | POA: Diagnosis not present

## 2022-07-21 DIAGNOSIS — S81012D Laceration without foreign body, left knee, subsequent encounter: Secondary | ICD-10-CM | POA: Diagnosis not present

## 2022-07-21 DIAGNOSIS — Z9981 Dependence on supplemental oxygen: Secondary | ICD-10-CM | POA: Diagnosis not present

## 2022-07-22 ENCOUNTER — Telehealth: Payer: Self-pay | Admitting: *Deleted

## 2022-07-22 NOTE — Telephone Encounter (Signed)
Received on ( 07/17/22) via of fax Physician Order from [facility].  Requesting signature,date,and return.  Given to provider to complete.    Physician Order signed,dated,and faxed back to [facility].  Confirmation received and copy scanned into the chart.//AB/CMA

## 2022-07-23 DIAGNOSIS — Z87891 Personal history of nicotine dependence: Secondary | ICD-10-CM | POA: Diagnosis not present

## 2022-07-23 DIAGNOSIS — S81012D Laceration without foreign body, left knee, subsequent encounter: Secondary | ICD-10-CM | POA: Diagnosis not present

## 2022-07-23 DIAGNOSIS — S51852D Open bite of left forearm, subsequent encounter: Secondary | ICD-10-CM | POA: Diagnosis not present

## 2022-07-23 DIAGNOSIS — S51811D Laceration without foreign body of right forearm, subsequent encounter: Secondary | ICD-10-CM | POA: Diagnosis not present

## 2022-07-23 DIAGNOSIS — Z9981 Dependence on supplemental oxygen: Secondary | ICD-10-CM | POA: Diagnosis not present

## 2022-07-23 DIAGNOSIS — W540XXD Bitten by dog, subsequent encounter: Secondary | ICD-10-CM | POA: Diagnosis not present

## 2022-07-23 DIAGNOSIS — S91011D Laceration without foreign body, right ankle, subsequent encounter: Secondary | ICD-10-CM | POA: Diagnosis not present

## 2022-07-23 DIAGNOSIS — G4700 Insomnia, unspecified: Secondary | ICD-10-CM | POA: Diagnosis not present

## 2022-07-24 DIAGNOSIS — S52602B Unspecified fracture of lower end of left ulna, initial encounter for open fracture type I or II: Secondary | ICD-10-CM | POA: Diagnosis not present

## 2022-07-24 DIAGNOSIS — S52609B Unspecified fracture of lower end of unspecified ulna, initial encounter for open fracture type I or II: Secondary | ICD-10-CM | POA: Diagnosis not present

## 2022-07-24 DIAGNOSIS — S548X2D Unspecified injury of other nerves at forearm level, left arm, subsequent encounter: Secondary | ICD-10-CM | POA: Diagnosis not present

## 2022-07-25 DIAGNOSIS — S81012D Laceration without foreign body, left knee, subsequent encounter: Secondary | ICD-10-CM | POA: Diagnosis not present

## 2022-07-25 DIAGNOSIS — G4700 Insomnia, unspecified: Secondary | ICD-10-CM | POA: Diagnosis not present

## 2022-07-25 DIAGNOSIS — W540XXD Bitten by dog, subsequent encounter: Secondary | ICD-10-CM | POA: Diagnosis not present

## 2022-07-25 DIAGNOSIS — Z87891 Personal history of nicotine dependence: Secondary | ICD-10-CM | POA: Diagnosis not present

## 2022-07-25 DIAGNOSIS — S91011D Laceration without foreign body, right ankle, subsequent encounter: Secondary | ICD-10-CM | POA: Diagnosis not present

## 2022-07-25 DIAGNOSIS — S51811D Laceration without foreign body of right forearm, subsequent encounter: Secondary | ICD-10-CM | POA: Diagnosis not present

## 2022-07-25 DIAGNOSIS — S51852D Open bite of left forearm, subsequent encounter: Secondary | ICD-10-CM | POA: Diagnosis not present

## 2022-07-25 DIAGNOSIS — Z9981 Dependence on supplemental oxygen: Secondary | ICD-10-CM | POA: Diagnosis not present

## 2022-07-28 ENCOUNTER — Ambulatory Visit (HOSPITAL_BASED_OUTPATIENT_CLINIC_OR_DEPARTMENT_OTHER)
Admission: RE | Admit: 2022-07-28 | Discharge: 2022-07-28 | Disposition: A | Discharge status: Home / Self Care | Payer: Medicare HMO | Attending: Plastic Surgery | Admitting: Plastic Surgery

## 2022-07-28 ENCOUNTER — Other Ambulatory Visit: Payer: Self-pay

## 2022-07-28 ENCOUNTER — Ambulatory Visit (HOSPITAL_BASED_OUTPATIENT_CLINIC_OR_DEPARTMENT_OTHER): Payer: Medicare HMO | Admitting: Anesthesiology

## 2022-07-28 ENCOUNTER — Encounter (HOSPITAL_BASED_OUTPATIENT_CLINIC_OR_DEPARTMENT_OTHER)
Admission: RE | Disposition: A | Discharge status: Home / Self Care | Payer: Self-pay | Source: Home / Self Care | Attending: Plastic Surgery

## 2022-07-28 ENCOUNTER — Encounter (HOSPITAL_BASED_OUTPATIENT_CLINIC_OR_DEPARTMENT_OTHER): Payer: Self-pay | Admitting: Plastic Surgery

## 2022-07-28 DIAGNOSIS — W540XXD Bitten by dog, subsequent encounter: Secondary | ICD-10-CM | POA: Diagnosis not present

## 2022-07-28 DIAGNOSIS — G4733 Obstructive sleep apnea (adult) (pediatric): Secondary | ICD-10-CM | POA: Diagnosis not present

## 2022-07-28 DIAGNOSIS — S41152D Open bite of left upper arm, subsequent encounter: Secondary | ICD-10-CM

## 2022-07-28 DIAGNOSIS — S41152A Open bite of left upper arm, initial encounter: Secondary | ICD-10-CM | POA: Diagnosis not present

## 2022-07-28 DIAGNOSIS — M21862 Other specified acquired deformities of left lower leg: Secondary | ICD-10-CM | POA: Diagnosis not present

## 2022-07-28 DIAGNOSIS — M1990 Unspecified osteoarthritis, unspecified site: Secondary | ICD-10-CM | POA: Diagnosis not present

## 2022-07-28 DIAGNOSIS — Z87891 Personal history of nicotine dependence: Secondary | ICD-10-CM | POA: Diagnosis not present

## 2022-07-28 HISTORY — PX: SKIN SPLIT GRAFT: SHX444

## 2022-07-28 HISTORY — PX: DEBRIDEMENT AND CLOSURE WOUND: SHX5614

## 2022-07-28 HISTORY — PX: APPLICATION OF WOUND VAC: SHX5189

## 2022-07-28 SURGERY — DEBRIDEMENT, WOUND, WITH CLOSURE
Anesthesia: General | Site: Thigh | Laterality: Left

## 2022-07-28 MED ORDER — FENTANYL CITRATE (PF) 100 MCG/2ML IJ SOLN
INTRAMUSCULAR | Status: DC | PRN
  Administered 2022-07-28: 50 ug via INTRAVENOUS

## 2022-07-28 MED ORDER — HYDROMORPHONE HCL 1 MG/ML IJ SOLN: 0.2500 mg | INTRAMUSCULAR | Status: DC | PRN

## 2022-07-28 MED ORDER — CHLORHEXIDINE GLUCONATE CLOTH 2 % EX PADS: 6.0000 | MEDICATED_PAD | Freq: Once | CUTANEOUS | Status: DC

## 2022-07-28 MED ORDER — ONDANSETRON HCL 4 MG/2ML IJ SOLN
INTRAMUSCULAR | Status: DC | PRN
  Administered 2022-07-28: 4 mg via INTRAVENOUS

## 2022-07-28 MED ORDER — BUPIVACAINE-EPINEPHRINE (PF) 0.25% -1:200000 IJ SOLN
INTRAMUSCULAR | Status: AC
  Filled 2022-07-28: qty 30

## 2022-07-28 MED ORDER — SODIUM CHLORIDE 0.9% FLUSH: 3.0000 mL | INTRAVENOUS | Status: DC | PRN

## 2022-07-28 MED ORDER — OXYCODONE HCL 5 MG PO TABS: 5.0000 mg | ORAL_TABLET | ORAL | Status: DC | PRN

## 2022-07-28 MED ORDER — MIDAZOLAM HCL 5 MG/5ML IJ SOLN
INTRAMUSCULAR | Status: DC | PRN
  Administered 2022-07-28: 2 mg via INTRAVENOUS

## 2022-07-28 MED ORDER — ACETAMINOPHEN 500 MG PO TABS
1000.0000 mg | ORAL_TABLET | Freq: Once | ORAL | Status: AC
  Administered 2022-07-28: 1000 mg via ORAL

## 2022-07-28 MED ORDER — ACETAMINOPHEN 500 MG PO TABS
ORAL_TABLET | ORAL | Status: AC
  Filled 2022-07-28: qty 2

## 2022-07-28 MED ORDER — AMISULPRIDE (ANTIEMETIC) 5 MG/2ML IV SOLN: 10.0000 mg | Freq: Once | INTRAVENOUS | Status: DC | PRN

## 2022-07-28 MED ORDER — ACETAMINOPHEN 325 MG PO TABS: 650.0000 mg | ORAL_TABLET | ORAL | Status: DC | PRN

## 2022-07-28 MED ORDER — BUPIVACAINE-EPINEPHRINE (PF) 0.25% -1:200000 IJ SOLN
INTRAMUSCULAR | Status: DC | PRN
  Administered 2022-07-28: 19 mL

## 2022-07-28 MED ORDER — CEFAZOLIN SODIUM-DEXTROSE 2-4 GM/100ML-% IV SOLN
INTRAVENOUS | Status: AC
  Filled 2022-07-28: qty 100

## 2022-07-28 MED ORDER — KETOROLAC TROMETHAMINE 30 MG/ML IJ SOLN: 30.0000 mg | Freq: Once | INTRAMUSCULAR | Status: DC | PRN

## 2022-07-28 MED ORDER — DEXAMETHASONE SODIUM PHOSPHATE 4 MG/ML IJ SOLN
INTRAMUSCULAR | Status: DC | PRN
  Administered 2022-07-28: 5 mg via INTRAVENOUS

## 2022-07-28 MED ORDER — FENTANYL CITRATE (PF) 100 MCG/2ML IJ SOLN: 25.0000 ug | INTRAMUSCULAR | Status: DC | PRN

## 2022-07-28 MED ORDER — SODIUM CHLORIDE 0.9% FLUSH: 3.0000 mL | Freq: Two times a day (BID) | INTRAVENOUS | Status: DC

## 2022-07-28 MED ORDER — MINERAL OIL LIGHT OIL
TOPICAL_OIL | Status: AC
  Filled 2022-07-28: qty 10

## 2022-07-28 MED ORDER — OXYCODONE HCL 5 MG PO TABS
5.0000 mg | ORAL_TABLET | Freq: Once | ORAL | Status: AC | PRN
  Administered 2022-07-28: 5 mg via ORAL

## 2022-07-28 MED ORDER — SODIUM CHLORIDE 0.9 % IV SOLN: 250.0000 mL | INTRAVENOUS | Status: DC | PRN

## 2022-07-28 MED ORDER — OXYCODONE HCL 5 MG PO TABS
ORAL_TABLET | ORAL | Status: AC
  Filled 2022-07-28: qty 1

## 2022-07-28 MED ORDER — LIDOCAINE HCL (CARDIAC) PF 100 MG/5ML IV SOSY
PREFILLED_SYRINGE | INTRAVENOUS | Status: DC | PRN
  Administered 2022-07-28: 60 mg via INTRAVENOUS

## 2022-07-28 MED ORDER — ONDANSETRON HCL 4 MG/2ML IJ SOLN: 4.0000 mg | Freq: Once | INTRAMUSCULAR | Status: DC | PRN

## 2022-07-28 MED ORDER — LACTATED RINGERS IV SOLN: INTRAVENOUS | Status: DC

## 2022-07-28 MED ORDER — PROPOFOL 10 MG/ML IV BOLUS
INTRAVENOUS | Status: DC | PRN
  Administered 2022-07-28: 200 mg via INTRAVENOUS

## 2022-07-28 MED ORDER — FENTANYL CITRATE (PF) 100 MCG/2ML IJ SOLN
INTRAMUSCULAR | Status: AC
  Filled 2022-07-28: qty 2

## 2022-07-28 MED ORDER — VASHE WOUND IRRIGATION OPTIME
TOPICAL | Status: DC | PRN
  Administered 2022-07-28: 34 [oz_av]

## 2022-07-28 MED ORDER — 0.9 % SODIUM CHLORIDE (POUR BTL) OPTIME
TOPICAL | Status: DC | PRN
  Administered 2022-07-28: 600 mL

## 2022-07-28 MED ORDER — CEFAZOLIN SODIUM-DEXTROSE 2-4 GM/100ML-% IV SOLN
2.0000 g | INTRAVENOUS | Status: AC
  Administered 2022-07-28: 2 g via INTRAVENOUS

## 2022-07-28 MED ORDER — ACETAMINOPHEN 325 MG RE SUPP: 650.0000 mg | RECTAL | Status: DC | PRN

## 2022-07-28 MED ORDER — MIDAZOLAM HCL 2 MG/2ML IJ SOLN
INTRAMUSCULAR | Status: AC
  Filled 2022-07-28: qty 2

## 2022-07-28 MED ORDER — PHENYLEPHRINE 80 MCG/ML (10ML) SYRINGE FOR IV PUSH (FOR BLOOD PRESSURE SUPPORT)
PREFILLED_SYRINGE | INTRAVENOUS | Status: DC | PRN
  Administered 2022-07-28 (×2): 80 ug via INTRAVENOUS

## 2022-07-28 MED ORDER — OXYCODONE HCL 5 MG/5ML PO SOLN: 5.0000 mg | Freq: Once | ORAL | Status: AC | PRN

## 2022-07-28 MED ORDER — MEPERIDINE HCL 25 MG/ML IJ SOLN: 6.2500 mg | INTRAMUSCULAR | Status: DC | PRN

## 2022-07-28 SURGICAL SUPPLY — 109 items
ADH SKN CLS APL DERMABOND .7 (GAUZE/BANDAGES/DRESSINGS)
APL SKNCLS STERI-STRIP NONHPOA (GAUZE/BANDAGES/DRESSINGS)
BAG DECANTER FOR FLEXI CONT (MISCELLANEOUS) IMPLANT
BALL CTTN LRG ABS STRL LF (GAUZE/BANDAGES/DRESSINGS)
BENZOIN TINCTURE PRP APPL 2/3 (GAUZE/BANDAGES/DRESSINGS) IMPLANT
BLADE CLIPPER SURG (BLADE) IMPLANT
BLADE DERMATOME SS (BLADE) IMPLANT
BLADE HEX COATED 2.75 (ELECTRODE) ×3 IMPLANT
BLADE MINI RND TIP GREEN BEAV (BLADE) IMPLANT
BLADE SURG 10 STRL SS (BLADE) IMPLANT
BLADE SURG 15 STRL LF DISP TIS (BLADE) ×3 IMPLANT
BLADE SURG 15 STRL SS (BLADE) ×3
BNDG CMPR 5X3 KNIT ELC UNQ LF (GAUZE/BANDAGES/DRESSINGS)
BNDG CMPR 5X4 CHSV STRCH STRL (GAUZE/BANDAGES/DRESSINGS)
BNDG CMPR 5X4 KNIT ELC UNQ LF (GAUZE/BANDAGES/DRESSINGS) ×6
BNDG CMPR 5X62 HK CLSR LF (GAUZE/BANDAGES/DRESSINGS)
BNDG CMPR 6 X 5 YARDS HK CLSR (GAUZE/BANDAGES/DRESSINGS)
BNDG CMPR 6"X 5 YARDS HK CLSR (GAUZE/BANDAGES/DRESSINGS)
BNDG COHESIVE 4X5 TAN STRL LF (GAUZE/BANDAGES/DRESSINGS) IMPLANT
BNDG ELASTIC 3INX 5YD STR LF (GAUZE/BANDAGES/DRESSINGS) IMPLANT
BNDG ELASTIC 4INX 5YD STR LF (GAUZE/BANDAGES/DRESSINGS) ×3 IMPLANT
BNDG ELASTIC 6INX 5YD STR LF (GAUZE/BANDAGES/DRESSINGS) IMPLANT
BNDG GAUZE DERMACEA FLUFF 4 (GAUZE/BANDAGES/DRESSINGS) ×3 IMPLANT
BNDG GZE DERMACEA 4 6PLY (GAUZE/BANDAGES/DRESSINGS) ×6
CANISTER SUCT 1200ML W/VALVE (MISCELLANEOUS) IMPLANT
CORD BIPOLAR FORCEPS 12FT (ELECTRODE) IMPLANT
COTTONBALL LRG STERILE PKG (GAUZE/BANDAGES/DRESSINGS) IMPLANT
COVER BACK TABLE 60X90IN (DRAPES) ×3 IMPLANT
COVER MAYO STAND STRL (DRAPES) ×3 IMPLANT
DERMABOND ADVANCED .7 DNX12 (GAUZE/BANDAGES/DRESSINGS) IMPLANT
DERMACARRIERS GRAFT 1 TO 1.5 (DISPOSABLE)
DRAIN CHANNEL 19F RND (DRAIN) IMPLANT
DRAIN PENROSE .5X12 LATEX STL (DRAIN) IMPLANT
DRAPE INCISE IOBAN 66X45 STRL (DRAPES) IMPLANT
DRAPE LAPAROSCOPIC ABDOMINAL (DRAPES) IMPLANT
DRAPE LAPAROTOMY 100X72 PEDS (DRAPES) IMPLANT
DRAPE SURG 17X23 STRL (DRAPES) IMPLANT
DRAPE U-SHAPE 76X120 STRL (DRAPES) ×3 IMPLANT
DRSG ADAPTIC 3X8 NADH LF (GAUZE/BANDAGES/DRESSINGS) IMPLANT
DRSG CUTIMED SORBACT 7X9 (GAUZE/BANDAGES/DRESSINGS) IMPLANT
DRSG EMULSION OIL 3X3 NADH (GAUZE/BANDAGES/DRESSINGS) IMPLANT
DRSG HYDROCOLLOID 4X4 (GAUZE/BANDAGES/DRESSINGS) IMPLANT
DRSG OPSITE 6X11 MED (GAUZE/BANDAGES/DRESSINGS) IMPLANT
DRSG TEGADERM 4X10 (GAUZE/BANDAGES/DRESSINGS) IMPLANT
DRSG TELFA 3X8 NADH STRL (GAUZE/BANDAGES/DRESSINGS) IMPLANT
DRSG VAC GRANUFOAM LG (GAUZE/BANDAGES/DRESSINGS) IMPLANT
ELECT NDL TIP 2.8 STRL (NEEDLE) IMPLANT
ELECT NEEDLE TIP 2.8 STRL (NEEDLE) IMPLANT
ELECT REM PT RETURN 9FT ADLT (ELECTROSURGICAL) ×3
ELECTRODE REM PT RTRN 9FT ADLT (ELECTROSURGICAL) ×3 IMPLANT
EVACUATOR SILICONE 100CC (DRAIN) IMPLANT
GAUZE PAD ABD 8X10 STRL (GAUZE/BANDAGES/DRESSINGS) IMPLANT
GAUZE SPONGE 4X4 12PLY STRL (GAUZE/BANDAGES/DRESSINGS) ×3 IMPLANT
GAUZE SPONGE 4X4 12PLY STRL LF (GAUZE/BANDAGES/DRESSINGS) IMPLANT
GAUZE XEROFORM 5X9 LF (GAUZE/BANDAGES/DRESSINGS) IMPLANT
GLOVE BIO SURGEON STRL SZ 6.5 (GLOVE) ×6 IMPLANT
GLOVE BIOGEL M STRL SZ7.5 (GLOVE) IMPLANT
GLOVE BIOGEL PI IND STRL 7.0 (GLOVE) IMPLANT
GLOVE BIOGEL PI IND STRL 8 (GLOVE) IMPLANT
GLOVE SURG SYN 7.5 E (GLOVE) ×3 IMPLANT
GLOVE SURG SYN 7.5 PF PI (GLOVE) IMPLANT
GOWN STRL REUS W/ TWL LRG LVL3 (GOWN DISPOSABLE) ×6 IMPLANT
GOWN STRL REUS W/ TWL XL LVL3 (GOWN DISPOSABLE) IMPLANT
GOWN STRL REUS W/TWL LRG LVL3 (GOWN DISPOSABLE) ×6
GOWN STRL REUS W/TWL XL LVL3 (GOWN DISPOSABLE) ×3
GRAFT DERMACARRIERS 1 TO 1.5 (DISPOSABLE) IMPLANT
GRAFT MYRIAD 3 LAYER 5X5 (Graft) IMPLANT
NDL HYPO 25X1 1.5 SAFETY (NEEDLE) ×3 IMPLANT
NDL HYPO 27GX1-1/4 (NEEDLE) IMPLANT
NEEDLE HYPO 25X1 1.5 SAFETY (NEEDLE) ×3 IMPLANT
NEEDLE HYPO 27GX1-1/4 (NEEDLE) IMPLANT
NS IRRIG 1000ML POUR BTL (IV SOLUTION) ×3 IMPLANT
PACK BASIN DAY SURGERY FS (CUSTOM PROCEDURE TRAY) ×3 IMPLANT
PADDING CAST ABS COTTON 3X4 (CAST SUPPLIES) IMPLANT
PADDING CAST ABS COTTON 4X4 ST (CAST SUPPLIES) IMPLANT
PENCIL SMOKE EVACUATOR (MISCELLANEOUS) ×3 IMPLANT
SHEET MEDIUM DRAPE 40X70 STRL (DRAPES) ×3 IMPLANT
SLEEVE SCD COMPRESS KNEE MED (STOCKING) ×3 IMPLANT
SPIKE FLUID TRANSFER (MISCELLANEOUS) IMPLANT
SPLINT FIBERGLASS 3X35 (CAST SUPPLIES) IMPLANT
SPLINT FIBERGLASS 4X30 (CAST SUPPLIES) IMPLANT
SPLINT PLASTER CAST XFAST 3X15 (CAST SUPPLIES) IMPLANT
SPONGE T-LAP 18X18 ~~LOC~~+RFID (SPONGE) ×3 IMPLANT
STAPLER VISISTAT 35W (STAPLE) IMPLANT
STOCKINETTE 4X48 STRL (DRAPES) IMPLANT
STOCKINETTE 6 STRL (DRAPES) ×3 IMPLANT
STOCKINETTE IMPERVIOUS LG (DRAPES) IMPLANT
SUCTION TUBE FRAZIER 10FR DISP (SUCTIONS) IMPLANT
SURGILUBE 2OZ TUBE FLIPTOP (MISCELLANEOUS) IMPLANT
SUT MNCRL AB 3-0 PS2 18 (SUTURE) IMPLANT
SUT MNCRL AB 4-0 PS2 18 (SUTURE) IMPLANT
SUT MON AB 5-0 PS2 18 (SUTURE) IMPLANT
SUT SILK 3 0 PS 1 (SUTURE) IMPLANT
SUT SILK 3 0 SH CR/8 (SUTURE) IMPLANT
SUT SILK 4 0 PS 2 (SUTURE) IMPLANT
SUT VIC AB 3-0 FS2 27 (SUTURE) IMPLANT
SUT VIC AB 4-0 PS2 18 (SUTURE) IMPLANT
SUT VIC AB 5-0 P-3 18X BRD (SUTURE) IMPLANT
SUT VIC AB 5-0 P3 18 (SUTURE)
SUT VIC AB 5-0 PS2 18 (SUTURE) IMPLANT
SWAB COLLECTION DEVICE MRSA (MISCELLANEOUS) IMPLANT
SWAB CULTURE ESWAB REG 1ML (MISCELLANEOUS) IMPLANT
SYR BULB IRRIG 60ML STRL (SYRINGE) ×3 IMPLANT
SYR CONTROL 10ML LL (SYRINGE) ×3 IMPLANT
TOWEL GREEN STERILE FF (TOWEL DISPOSABLE) ×6 IMPLANT
TRAY DSU PREP LF (CUSTOM PROCEDURE TRAY) ×3 IMPLANT
TUBE CONNECTING 20X1/4 (TUBING) ×3 IMPLANT
UNDERPAD 30X36 HEAVY ABSORB (UNDERPADS AND DIAPERS) ×3 IMPLANT
YANKAUER SUCT BULB TIP NO VENT (SUCTIONS) ×3 IMPLANT

## 2022-07-28 NOTE — Anesthesia Procedure Notes (Signed)
Procedure Name: LMA Insertion Date/Time: 07/28/2022 10:59 AM  Performed by: Ronnette Hila, CRNAPre-anesthesia Checklist: Patient identified, Emergency Drugs available, Suction available and Patient being monitored Patient Re-evaluated:Patient Re-evaluated prior to induction Oxygen Delivery Method: Circle system utilized Preoxygenation: Pre-oxygenation with 100% oxygen Induction Type: IV induction Ventilation: Mask ventilation without difficulty LMA: LMA inserted LMA Size: 4.0 Number of attempts: 1 Airway Equipment and Method: Bite block Placement Confirmation: positive ETCO2 Tube secured with: Tape Dental Injury: Teeth and Oropharynx as per pre-operative assessment

## 2022-07-28 NOTE — Transfer of Care (Signed)
Immediate Anesthesia Transfer of Care Note  Patient: [name]  Procedure(s) Performed: Left arm wound excision with myriad and skin graft placement with VAC (Left: Arm Upper) APPLICATION OF SKIN SUBSTITUTE (Left: Arm Lower) SKIN GRAFT SPLIT THICKNESS (Left: Thigh) APPLICATION OF WOUND VAC (Left: Arm Upper)  Patient Location: PACU  Anesthesia Type:General  Level of Consciousness: awake and drowsy  Airway & Oxygen Therapy: Patient Spontanous Breathing  Post-op Assessment: Report given to RN, Post -op Vital signs reviewed and stable, and Patient moving all extremities X 4  Post vital signs: Reviewed and stable  Last Vitals:  Vitals Value Taken Time  BP    Temp    Pulse    Resp    SpO2      Last Pain:  Vitals:   07/28/22 1018  TempSrc: Oral  PainSc: 0-No pain         Complications: No notable events documented.

## 2022-07-28 NOTE — Anesthesia Postprocedure Evaluation (Signed)
Anesthesia Post Note  Patient: [name] Performed: Left arm wound excision with myriad and skin graft placement with VAC (Left: Arm Upper) APPLICATION OF SKIN SUBSTITUTE (Left: Arm Lower) SKIN GRAFT SPLIT THICKNESS (Left: Thigh) APPLICATION OF WOUND VAC (Left: Arm Upper)     Patient location during evaluation: PACU Anesthesia Type: General Level of consciousness: awake and alert, oriented and patient cooperative Pain management: pain level controlled Vital Signs Assessment: post-procedure vital signs reviewed and stable Respiratory status: spontaneous breathing, nonlabored ventilation and respiratory function stable Cardiovascular status: blood pressure returned to baseline and stable Postop Assessment: no apparent nausea or vomiting Anesthetic complications: no   No notable events documented.  Last Vitals:  Vitals:   07/28/22 1215 07/28/22 1245  BP: (!) 142/75 (!) 160/74  Pulse: 61 67  Resp: 14 14  Temp:  (!) 36.1 C  SpO2: 90% 98%    Last Pain:  Vitals:   07/28/22 1240  TempSrc:   PainSc: 5                  Lannie Fields

## 2022-07-28 NOTE — Anesthesia Preprocedure Evaluation (Addendum)
Anesthesia Evaluation  Patient identified by MRN, date of birth, ID band Patient awake    Reviewed: Allergy & Precautions, NPO status , Patient's Chart, lab work & pertinent test results  Airway Mallampati: III  TM Distance: >3 FB Neck ROM: Full    Dental  (+) Teeth Intact, Dental Advisory Given   Pulmonary sleep apnea and Continuous Positive Airway Pressure Ventilation , former smoker Quit smoking 2012, 18 pack year history    Pulmonary exam normal breath sounds clear to auscultation       Cardiovascular negative cardio ROS Normal cardiovascular exam Rhythm:Regular Rate:Normal     Neuro/Psych negative neurological ROS  negative psych ROS   GI/Hepatic negative GI ROS, Neg liver ROS,,,  Endo/Other  negative endocrine ROS    Renal/GU negative Renal ROS  negative genitourinary   Musculoskeletal  (+) Arthritis , Osteoarthritis,    Abdominal   Peds  Hematology negative hematology ROS (+)   Anesthesia Other Findings   Reproductive/Obstetrics negative OB ROS                             Anesthesia Physical Anesthesia Plan  ASA: 2  Anesthesia Plan: General   Post-op Pain Management: Tylenol PO (pre-op)*, Toradol IV (intra-op)* and Ketamine IV*   Induction: Intravenous  PONV Risk Score and Plan: 3 and Ondansetron, Dexamethasone, Midazolam and Treatment may vary due to age or medical condition  Airway Management Planned: LMA  Additional Equipment: None  Intra-op Plan:   Post-operative Plan: Extubation in OR  Informed Consent: I have reviewed the patients History and Physical, chart, labs and discussed the procedure including the risks, benefits and alternatives for the proposed anesthesia with the patient or authorized representative who has indicated his/her understanding and acceptance.     Dental advisory given  Plan Discussed with: CRNA  Anesthesia Plan Comments:         Anesthesia Quick Evaluation

## 2022-07-28 NOTE — Op Note (Signed)
DATE OF OPERATION: 07/28/2022  LOCATION: [facility]  PREOPERATIVE DIAGNOSIS: left arm dog bite wound  POSTOPERATIVE DIAGNOSIS: Same  PROCEDURE: split thickness skin graft to left arm dog bite with myriad placement 5 x 5 cm  SURGEON: Foster Simpson, DO  ASSISTANT: Evelena Leyden, PA  EBL: none  CONDITION: Stable  COMPLICATIONS: None  INDICATION: The patient, April Bowman, is a 67 y.o. female born on 05/21/1955, is here for treatment of a left arm dog bite wound.   PROCEDURE DETAILS:  The patient was seen prior to surgery and marked.  The IV antibiotics were given. The patient was taken to the operating room and given a general anesthetic. A standard time out was performed and all information was confirmed by those in the room. The left arm and leg were prepped and draped.  Local was placed in the left leg for intraoperative hemostasis and postoperative pain control.  The 3 in dermatome guard was placed.  The dermatome was set at 12/998 inch.  The left leg was used to obtain the donor tissue for the graft.  The left arm was irrigated with Vashe.  The donor tissue was placed on the 3 x 7 cm wound and sutured in place with the 5-0 Vicryl.  It was then covered with the sorbact and a VAC was applied with an excellent seal.  The leg donor site was covered with the 5 x 5 cm myriad.  It was sutured in place with the 5-0 Vicryl.  It was then covered by the sorbact.  Ky gel was applied and a sterile dressing.  The left arm was placed in a palmar splint.  The patient was allowed to wake up and taken to recovery room in stable condition at the end of the case. The family was notified at the end of the case.   The advanced practice practitioner (APP) assisted throughout the case.  The APP was essential in retraction and counter traction when needed to make the case progress smoothly.  This retraction and assistance made it possible to see the tissue plans for the procedure.  The  assistance was needed for blood control, tissue re-approximation and assisted with closure of the incision site.

## 2022-07-28 NOTE — Discharge Instructions (Signed)
  Post Anesthesia Home Care Instructions  Activity: Get plenty of rest for the remainder of the day. A responsible individual must stay with you for 24 hours following the procedure.  For the next 24 hours, DO NOT: -Drive a car -Advertising copywriter -Drink alcoholic beverages -Take any medication unless instructed by your physician -Make any legal decisions or sign important papers.  Meals: Start with liquid foods such as gelatin or soup. Progress to regular foods as tolerated. Avoid greasy, spicy, heavy foods. If nausea and/or vomiting occur, drink only clear liquids until the nausea and/or vomiting subsides. Call your physician if vomiting continues.  Special Instructions/Symptoms: Your throat may feel dry or sore from the anesthesia or the breathing tube placed in your throat during surgery. If this causes discomfort, gargle with warm salt water. The discomfort should disappear within 24 hours.  If you had a scopolamine patch placed behind your ear for the management of post- operative nausea and/or vomiting:  1. The medication in the patch is effective for 72 hours, after which it should be removed.  Wrap patch in a tissue and discard in the trash. Wash hands thoroughly with soap and water. 2. You may remove the patch earlier than 72 hours if you experience unpleasant side effects which may include dry mouth, dizziness or visual disturbances. 3. Avoid touching the patch. Wash your hands with soap and water after contact with the patch.    No tylenol until after 4:20pm today if needed.

## 2022-07-28 NOTE — Interval H&P Note (Signed)
History and Physical Interval Note:  07/28/2022 10:49 AM  April Bowman  has presented today for surgery, with the diagnosis of Dog bite.  The various methods of treatment have been discussed with the patient and family. After consideration of risks, benefits and other options for treatment, the patient has consented to  Procedure(s): Left arm wound excision with myriad and skin graft placement with VAC (Left) APPLICATION OF SKIN SUBSTITUTE (Left) SKIN GRAFT SPLIT THICKNESS (Left) APPLICATION OF WOUND VAC (Left) as a surgical intervention.  The patient's history has been reviewed, patient examined, no change in status, stable for surgery.  I have reviewed the patient's chart and labs.  Questions were answered to the patient's satisfaction.     Alena Bills Hawraa Stambaugh

## 2022-07-29 ENCOUNTER — Encounter (HOSPITAL_BASED_OUTPATIENT_CLINIC_OR_DEPARTMENT_OTHER): Payer: Self-pay | Admitting: Plastic Surgery

## 2022-07-29 ENCOUNTER — Telehealth: Payer: Self-pay | Admitting: Physician Assistant

## 2022-07-29 NOTE — Telephone Encounter (Signed)
Patient said he she has surgery yesterday and she has been using a machine called activac from [employer] and that they deactivated it and they needed [place] to call to have it re-activated. She needed [place] to call [phone]. Please advise. She said it needed to be done today

## 2022-07-29 NOTE — Telephone Encounter (Signed)
I called April Bowman to let her know [name] had spoken with [employer] and given them the information they needed to continue the [facility]. She asked if someone will need to come to her house to change the [facility] before her next visit on 7/23. Also if she needs to change the dressing over the donor site. Please advise

## 2022-07-29 NOTE — Telephone Encounter (Signed)
I called [employer] at the number provided above. They are aware patient needs wound vac continued and are working on the proper paperwork. Provided them with wound measurements they requested to continue the wound vac.

## 2022-07-30 NOTE — Telephone Encounter (Signed)
Hi Joss, please relay to patient that she can leave the dressings in place - no VAC removal until she comes to the office for post op visit. Thank you

## 2022-08-05 ENCOUNTER — Encounter: Payer: Self-pay | Admitting: Plastic Surgery

## 2022-08-05 ENCOUNTER — Ambulatory Visit: Payer: Medicare HMO | Admitting: Plastic Surgery

## 2022-08-05 DIAGNOSIS — W540XXD Bitten by dog, subsequent encounter: Secondary | ICD-10-CM

## 2022-08-05 DIAGNOSIS — S41152D Open bite of left upper arm, subsequent encounter: Secondary | ICD-10-CM

## 2022-08-05 NOTE — Addendum Note (Signed)
Addended by: Sherol Dade on: 08/05/2022 01:58 PM   Modules accepted: Orders

## 2022-08-05 NOTE — Progress Notes (Signed)
   Subjective:    Patient ID: April Bowman, female    DOB: 25-Sep-1955, 67 y.o.   MRN: [id number]  The patient is a 67 year old female here for follow-up after undergoing several debridements and placement of a skin graft July 15.  She was by a dog on the left arm and a skin graft was placed with VAC dressing.      Review of Systems  Constitutional:  Positive for activity change.  Eyes: Negative.   Respiratory: Negative.    Cardiovascular: Negative.   Gastrointestinal: Negative.   Genitourinary: Negative.        Objective:   Physical Exam Constitutional:      Appearance: Normal appearance.  Cardiovascular:     Rate and Rhythm: Normal rate.     Pulses: Normal pulses.  Skin:    Capillary Refill: Capillary refill takes less than 2 seconds.  Neurological:     Mental Status: She is alert and oriented to person, place, and time.  Psychiatric:        Mood and Affect: Mood normal.        Behavior: Behavior normal.        Thought Content: Thought content normal.        Judgment: Judgment normal.         Assessment & Plan:     ICD-10-CM   1. Dog bite of left upper extremity, subsequent encounter  S41.152D    W54.0XXD        The VAC was removed.  She has good take.  A Xeroform was applied.  She is to change it every 3 days.  I like to see her back in 3 days for dressing change.

## 2022-08-07 ENCOUNTER — Telehealth: Payer: Self-pay | Admitting: *Deleted

## 2022-08-07 NOTE — Telephone Encounter (Signed)
Faxed on (07/29/22) recent Op notes and (OR#) to [facility] per Leander Rams. Confirmation received and copy scanned into the chart.//AB/CMA

## 2022-08-08 ENCOUNTER — Ambulatory Visit: Payer: Medicare HMO | Admitting: Physician Assistant

## 2022-08-08 ENCOUNTER — Telehealth: Payer: Self-pay

## 2022-08-08 DIAGNOSIS — W540XXD Bitten by dog, subsequent encounter: Secondary | ICD-10-CM

## 2022-08-08 DIAGNOSIS — S41152D Open bite of left upper arm, subsequent encounter: Secondary | ICD-10-CM

## 2022-08-08 NOTE — Telephone Encounter (Signed)
Spoke to [name] at [facility] and gave verbal orders to continue dressing changes 3x a week with vaseline, xeroform, kerlix and ace arap on L arm; KY Jelly or Vaseline to L thigh donor site covered with 4 x 8 Mepilex silicone border.  She conveyed understanding and stated that dressing changes would take place starting Monday of next week and will continue seeing pt on M, W, Fri until they receive orders to stop.   Called pt and informed her and she conveyed understanding.

## 2022-08-08 NOTE — Progress Notes (Signed)
Patient is a pleasant 67 year old female with PMH of left upper extremity wound that required debridement and wound VAC in the OR now s/p STSG 07/28/2022 by Dr. Ulice Bold who presents to clinic for postoperative follow-up.  She was last seen here in clinic on 08/05/2022.  At that time, VAC was removed and she had good take of skin graft.  Xeroform dressing was applied and plan was to change it every 3 days.  Return in 3 days for dressing change.  Today, she is doing okay.  She states that the splint that was placed at time of STSG has been uncomfortable and she is hoping to be removed.  She has not addressed her STSG placement site since her bandage was changed in clinic.  As for the skin graft harvest site, she states that it is uncomfortable and bulky.  She states that it has not been touched since surgery and she thinks it may be hard and dry.  On exam, the dressings are removed from the skin graft harvest site.  The donor site that had myriad placed at time of surgery appears desiccated and dry, hard.  Overlying Sorbact is secured with 5-0 Vicryl's remains in place.  Given how hard it is, will not attempt to remove today and provoked bleeding.  Instead, recommending daily Vaseline and then will cover with bordered Mepilex dressing.  Would recommend K-Y jelly, but she states that she does not have any at home and given how hard it is, feel as though petroleum-based lubricant is okay.  As for the skin graft placement site, the wound looks well-healed.  See picture obtained and placed in chart.  Placed Vaseline followed by Xeroform and secured with 4 x 4 gauze, Kerlix, and Ace wrap.  She continues to wear a sling, but does not need to from our standpoint.  She states that she is following up with orthopedics and her next visit is early August.  She does require additional orders to be placed with her home health nursing agency.  Recommend that they continue with 3 days/week to assist with dressing changes  given that she cannot take her left arm out of the sling, remove Ace and Kerlix, and perform wound care independently.  Follow-up with our practice as scheduled.  Picture(s) obtained of the patient and placed in the chart were with the patient's or guardian's permission.

## 2022-08-11 DIAGNOSIS — Z87891 Personal history of nicotine dependence: Secondary | ICD-10-CM | POA: Diagnosis not present

## 2022-08-11 DIAGNOSIS — W540XXD Bitten by dog, subsequent encounter: Secondary | ICD-10-CM | POA: Diagnosis not present

## 2022-08-11 DIAGNOSIS — S51852D Open bite of left forearm, subsequent encounter: Secondary | ICD-10-CM | POA: Diagnosis not present

## 2022-08-11 NOTE — Telephone Encounter (Signed)
Thank you :)

## 2022-08-13 ENCOUNTER — Telehealth: Payer: Self-pay | Admitting: *Deleted

## 2022-08-13 DIAGNOSIS — W540XXD Bitten by dog, subsequent encounter: Secondary | ICD-10-CM | POA: Diagnosis not present

## 2022-08-13 DIAGNOSIS — S51852D Open bite of left forearm, subsequent encounter: Secondary | ICD-10-CM | POA: Diagnosis not present

## 2022-08-13 DIAGNOSIS — Z87891 Personal history of nicotine dependence: Secondary | ICD-10-CM | POA: Diagnosis not present

## 2022-08-13 NOTE — Telephone Encounter (Signed)
Order signed by Dr. Ulice Bold and faxed to [facility] with fax confirmation received. Copy to batch scan

## 2022-08-15 DIAGNOSIS — W540XXD Bitten by dog, subsequent encounter: Secondary | ICD-10-CM | POA: Diagnosis not present

## 2022-08-15 DIAGNOSIS — S51852D Open bite of left forearm, subsequent encounter: Secondary | ICD-10-CM | POA: Diagnosis not present

## 2022-08-15 DIAGNOSIS — Z87891 Personal history of nicotine dependence: Secondary | ICD-10-CM | POA: Diagnosis not present

## 2022-08-15 NOTE — Progress Notes (Unsigned)
Patient is a pleasant 67 year old female with PMH of left upper extremity wound that required debridement and wound VAC in the OR now s/p STSG 07/28/2022 by Dr. Ulice Bold who presents to clinic for postoperative follow-up.   She was last seen here in clinic on 08/08/2022.  At that time, the donor site that had myriad placed at time of surgery appeared hard and desiccated.  Advised liberal amounts of Vaseline given that she did not have any K-Y jelly at home followed by bordered Mepilex dressing.  Hopefully it will soften and the overlying Sorbact will be able to be removed at subsequent encounter.  As for the skin graft placement site, wound look to be healing well.  Recommended Vaseline followed by Xeroform secured with 4 x 4 gauze, Kerlix, and Ace wrap.  Recommend follow-up with orthopedics regarding her sling.  Pictures obtained and placed in chart.  Today, patient is doing well.  She states that home health continues to assist with dressing changes at home.  She states that she continues to endorse some swelling and discomfort with certain movements or extension of the arm.  While range of motion has improved, she has not been to PT.  She has not seen orthopedics in a month and states that her next appointment was recently pushed back an additional couple of weeks.  She is unclear when she can remove her sling.  On exam, the sutures are removed from both the STSG harvest and graft sites.  Appears to be healing quite well.  No evidence concerning for infection.  Wounds appear healed.  Recommend that she simply apply Vaseline and Xeroform to allow for adequate moisturization for a couple of weeks, but then she can transition to silicone scar gels/sheets.  Cleared from our standpoint.  Recommend that she follow-up with our clinic in 1 month for reevaluation after seeing orthopedics.  However, will defer PT/ongoing sling management to them.    Picture(s) obtained of the patient and placed in the chart were  with the patient's or guardian's permission.

## 2022-08-18 ENCOUNTER — Ambulatory Visit (INDEPENDENT_AMBULATORY_CARE_PROVIDER_SITE_OTHER): Payer: Medicare HMO | Admitting: Physician Assistant

## 2022-08-18 VITALS — BP 142/83 | HR 77

## 2022-08-18 DIAGNOSIS — S41152D Open bite of left upper arm, subsequent encounter: Secondary | ICD-10-CM

## 2022-08-18 DIAGNOSIS — W540XXD Bitten by dog, subsequent encounter: Secondary | ICD-10-CM

## 2022-08-22 DIAGNOSIS — W540XXD Bitten by dog, subsequent encounter: Secondary | ICD-10-CM | POA: Diagnosis not present

## 2022-08-22 DIAGNOSIS — Z87891 Personal history of nicotine dependence: Secondary | ICD-10-CM | POA: Diagnosis not present

## 2022-08-22 DIAGNOSIS — S51852D Open bite of left forearm, subsequent encounter: Secondary | ICD-10-CM | POA: Diagnosis not present

## 2022-08-27 ENCOUNTER — Telehealth: Payer: Self-pay

## 2022-08-27 NOTE — Telephone Encounter (Signed)
Yes, that is completely fine. Thank you

## 2022-08-27 NOTE — Telephone Encounter (Signed)
Spoke to April Bowman and advised that the provider is ok with new orders mentioned in voicemail. She conveyed understanding. She reported that she attempted to see pt today but pt declined until next week. Adv her that should be fine.

## 2022-08-27 NOTE — Telephone Encounter (Signed)
Judeth Cornfield, RN from [facility] left message stating that pt's L arm skin graft and L thigh donor site is completely healed. They will decrease visits to once per week for a max of 3 weeks and discharge patient at the end of the weeks. If you are ok with these orders I will call Judeth Cornfield back and confirm.   Call back # [phone].

## 2022-09-02 ENCOUNTER — Encounter: Payer: Medicare HMO | Admitting: Physician Assistant

## 2022-09-03 ENCOUNTER — Emergency Department (HOSPITAL_BASED_OUTPATIENT_CLINIC_OR_DEPARTMENT_OTHER)
Admission: EM | Admit: 2022-09-03 | Discharge: 2022-09-03 | Disposition: A | Discharge status: Home / Self Care | Payer: Medicare HMO | Attending: Emergency Medicine | Admitting: Emergency Medicine

## 2022-09-03 ENCOUNTER — Encounter (HOSPITAL_BASED_OUTPATIENT_CLINIC_OR_DEPARTMENT_OTHER): Payer: Self-pay | Admitting: Emergency Medicine

## 2022-09-03 ENCOUNTER — Other Ambulatory Visit: Payer: Self-pay

## 2022-09-03 DIAGNOSIS — T7840XA Allergy, unspecified, initial encounter: Secondary | ICD-10-CM | POA: Insufficient documentation

## 2022-09-03 MED ORDER — EPINEPHRINE 0.3 MG/0.3ML IJ SOAJ: 0.3000 mg | INTRAMUSCULAR | 0 refills | Status: AC | PRN

## 2022-09-03 MED ORDER — DIPHENHYDRAMINE HCL 25 MG PO TABS: 25.0000 mg | ORAL_TABLET | Freq: Three times a day (TID) | ORAL | 0 refills | Status: AC | PRN

## 2022-09-03 MED ORDER — NAPROXEN 500 MG PO TABS: 500.0000 mg | ORAL_TABLET | Freq: Two times a day (BID) | ORAL | 0 refills | Status: AC

## 2022-09-03 MED ORDER — DIPHENHYDRAMINE HCL 25 MG PO CAPS
25.0000 mg | ORAL_CAPSULE | Freq: Once | ORAL | Status: AC
  Administered 2022-09-03: 25 mg via ORAL
  Filled 2022-09-03: qty 1

## 2022-09-03 NOTE — Discharge Instructions (Addendum)
Take Benadryl every 8 hours for the next 2-3 days as needed for rash. I have sent a prescription for an Epipen to the pharmacy to use if you develop a rash with a throat or tongue swelling sensation.   Follow up with your PCP in 3-5 days for reevaluation and allergen skin testing.  Return to ED if: you develop tongue swelling, throat tightness.

## 2022-09-03 NOTE — ED Provider Notes (Signed)
[place] [facility] AT [facility] Provider Note   CSN: [id number] Arrival date & time: 09/03/22  1125     History  Chief Complaint  Patient presents with   Skin Problem    April Bowman is a 67 y.o. female with a history of obstructive sleep apnea who presents the ED today for a skin concern.  Patient reports that around 11 AM this morning she started to develop an itching and burning sensation as well as redness to her skin.  Started from her head and went down her body.  She has never experienced anything like this before.  Patient states that she recently restarted niacin but did not have any interactions on it in the past when she took it. No shortness of breath, angioedema, or throat swelling sensation.  Patient reports her rash has improved at the time of evaluation.  No other concerns or complaints at this time.    Home Medications Prior to Admission medications   Medication Sig Start Date End Date Taking? Authorizing Provider  diphenhydrAMINE (BENADRYL) 25 MG tablet Take 1 tablet (25 mg total) by mouth every 8 (eight) hours as needed for up to 5 days. 09/03/22 09/08/22 Yes Maxwell Marion, PA-C  EPINEPHrine 0.3 mg/0.3 mL IJ SOAJ injection Inject 0.3 mg into the muscle as needed for up to 1 day for anaphylaxis. 09/03/22 09/04/22 Yes Maxwell Marion, PA-C  naproxen (NAPROSYN) 500 MG tablet Take 1 tablet (500 mg total) by mouth 2 (two) times daily for 14 days. 09/03/22 09/17/22 Yes Maxwell Marion, PA-C  acetaminophen (TYLENOL) 500 MG tablet Take 2 tablets (1,000 mg total) by mouth every 6 (six) hours as needed for mild pain or fever. 05/27/22   Juliet Rude, PA-C  ASCORBIC ACID PO Take 1 tablet by mouth daily. Vitamin C, unknown strength.    [provider]  Cholecalciferol (VITAMIN D-3 PO) Take 1 capsule by mouth daily.    [provider]  Cyanocobalamin (VITAMIN B-12 PO) Take 1 tablet by mouth daily.    [provider]  estradiol (ESTRACE  VAGINAL) 0.1 MG/GM vaginal cream Place 1 g vaginally at bedtime. 05/02/16   [provider]  gabapentin (NEURONTIN) 300 MG capsule Take 1 capsule (300 mg total) by mouth 3 (three) times daily. 05/27/22 07/28/22  Juliet Rude, PA-C  MAGNESIUM OXIDE PO Take 1 tablet by mouth daily.    [provider]  MORINGA OLEIFERA PO Take 1 capsule by mouth daily.    [provider]  OVER THE COUNTER MEDICATION Take 1 tablet by mouth daily. Vitamin A + Vitamin D3 combination supplement.    [provider]  POTASSIUM GLUCONATE PO Take 1 tablet by mouth daily.    [provider]  QUEtiapine (SEROQUEL) 100 MG tablet Take 100 mg by mouth at bedtime.    [provider]  VITAMIN E PO Take 1 capsule by mouth daily.    [provider]      Allergies    Patient has no known allergies.    Review of Systems   Review of Systems  Skin:  Positive for color change and rash.  All other systems reviewed and are negative.   Physical Exam Updated Vital Signs BP 122/85 (BP Location: Right Arm)   Pulse 78   Temp 97.7 F (36.5 C) (Oral)   Resp 16   Ht 5\' 1"  (1.549 m)   Wt 63.5 kg   SpO2 100%   BMI 26.45 kg/m  Physical  Exam Vitals and nursing note reviewed.  Constitutional:      Appearance: Normal appearance.  HENT:     Head: Normocephalic and atraumatic.     Mouth/Throat:     Mouth: Mucous membranes are moist.     Pharynx: Oropharynx is clear.     Comments: No signs of angioedema or swelling of the oropharynx Eyes:     Conjunctiva/sclera: Conjunctivae normal.     Pupils: Pupils are equal, round, and reactive to light.  Cardiovascular:     Rate and Rhythm: Normal rate and regular rhythm.     Pulses: Normal pulses.     Heart sounds: Normal heart sounds.  Pulmonary:     Effort: Pulmonary effort is normal.     Breath sounds: Normal breath sounds.  Abdominal:     Palpations: Abdomen is soft.     Tenderness: There is no abdominal tenderness.   Skin:    General: Skin is warm and dry.     Findings: Erythema and rash present.     Comments: Blanching, non-raised erythematous patches present at the chest, upper back, and knees bilaterally  Neurological:     General: No focal deficit present.     Mental Status: She is alert.     Sensory: No sensory deficit.     Motor: No weakness.  Psychiatric:        Mood and Affect: Mood normal.        Behavior: Behavior normal.     ED Results / Procedures / Treatments   Labs (all labs ordered are listed, but only abnormal results are displayed) Labs Reviewed - No data to display  EKG None  Radiology No results found.  Procedures Procedures: not indicated.   Medications Ordered in ED Medications  diphenhydrAMINE (BENADRYL) capsule 25 mg (25 mg Oral Given 09/03/22 1300)    ED Course/ Medical Decision Making/ A&P                                 Medical Decision Making  This patient presents to the ED for concern of skin rash, this involves an extensive number of treatment options, and is a complaint that carries with it a high risk of complications and morbidity.   Differential diagnosis includes: Atopic dermatitis, contact dermatitis, drug reaction, allergic reaction, etc.   Comorbidities  See HPI above   Additional History  Additional history obtained from patient's previous records.   Problem List / ED Course / Critical Interventions / Medication Management  Red and burning rash on skin Patient reports rash improvement at time of evaluation I ordered medications including: Benadryl for rash  I have reviewed the patients home medicines and have made adjustments as needed.   Social Determinants of Health  Access to healthcare   Test / Admission - Considered  With shared decision making, no labs or imaging were ordered at this time. We have sent a prescription for Benadryl and an EpiPen to the patient's pharmacy.  Additionally, patient reports that she  ran out of her naproxen that she is taking for left arm pain after her dog bite recently prescription for that as well. Patient is hemodynamically stable and safe for discharge home. All questions were answered. Return precautions provided.       Final Clinical Impression(s) / ED Diagnoses Final diagnoses:  Allergic reaction, initial encounter    Rx / DC Orders ED Discharge Orders  Ordered    diphenhydrAMINE (BENADRYL) 25 MG tablet  Every 8 hours PRN        09/03/22 1235    naproxen (NAPROSYN) 500 MG tablet  2 times daily        09/03/22 1235    EPINEPHrine 0.3 mg/0.3 mL IJ SOAJ injection  As needed        09/03/22 1235              Maxwell Marion, PA-C 09/03/22 1313    Horton, Clabe Seal, DO 09/04/22 1710

## 2022-09-03 NOTE — ED Triage Notes (Signed)
Pt states she started to have skin redness and burning today.  Appears systemic.  Pt states she takes niacin, had stopped taking it for a while, and restarted it today.  No new medications

## 2022-09-16 NOTE — Progress Notes (Deleted)
Referring Provider No referring provider defined for this encounter.   CC: No chief complaint on file.     April Bowman is an 67 y.o. female.  HPI: Patient is a pleasant 67 year old female with PMH of left upper extremity wound that required debridement and wound VAC in the OR now s/p STSG 07/28/2022 by Dr. Ulice Bold who presents to clinic for postoperative follow-up.   She was last seen here in clinic on 08/18/2022.  At that time, she had not yet been evaluated by PT.  She also was inquiring about removal of her sling.  Informed patient that from our standpoint, both the STSG harvest and graft sites appear well-healed and she does not need any sling or support from our standpoint.  Recommend that she follow-up with orthopedics for their input given her comminuted displaced distal ulna fracture sustained from the trauma.    No Known Allergies  Outpatient Encounter Medications as of 09/17/2022  Medication Sig   acetaminophen (TYLENOL) 500 MG tablet Take 2 tablets (1,000 mg total) by mouth every 6 (six) hours as needed for mild pain or fever.   ASCORBIC ACID PO Take 1 tablet by mouth daily. Vitamin C, unknown strength.   Cholecalciferol (VITAMIN D-3 PO) Take 1 capsule by mouth daily.   Cyanocobalamin (VITAMIN B-12 PO) Take 1 tablet by mouth daily.   diphenhydrAMINE (BENADRYL) 25 MG tablet Take 1 tablet (25 mg total) by mouth every 8 (eight) hours as needed for up to 5 days.   estradiol (ESTRACE VAGINAL) 0.1 MG/GM vaginal cream Place 1 g vaginally at bedtime.   gabapentin (NEURONTIN) 300 MG capsule Take 1 capsule (300 mg total) by mouth 3 (three) times daily.   MAGNESIUM OXIDE PO Take 1 tablet by mouth daily.   MORINGA OLEIFERA PO Take 1 capsule by mouth daily.   naproxen (NAPROSYN) 500 MG tablet Take 1 tablet (500 mg total) by mouth 2 (two) times daily for 14 days.   OVER THE COUNTER MEDICATION Take 1 tablet by mouth daily. Vitamin A + Vitamin D3 combination supplement.   POTASSIUM  GLUCONATE PO Take 1 tablet by mouth daily.   QUEtiapine (SEROQUEL) 100 MG tablet Take 100 mg by mouth at bedtime.   VITAMIN E PO Take 1 capsule by mouth daily.   No facility-administered encounter medications on file as of 09/17/2022.     Past Medical History:  Diagnosis Date   Arthritis    Sleep apnea     Past Surgical History:  Procedure Laterality Date   APPLICATION OF WOUND VAC Left 06/04/2022   Procedure: APPLICATION OF WOUND VAC;  Surgeon: Peggye Form, DO;  Location: [facility];  Service: Plastics;  Laterality: Left;   APPLICATION OF WOUND VAC Left 06/12/2022   Procedure: APPLICATION OF WOUND VAC;  Surgeon: Peggye Form, DO;  Location: [facility];  Service: Plastics;  Laterality: Left;   APPLICATION OF WOUND VAC Left 07/28/2022   Procedure: APPLICATION OF WOUND VAC;  Surgeon: Peggye Form, DO;  Location: [facility];  Service: Plastics;  Laterality: Left;   AUGMENTATION MAMMAPLASTY     DEBRIDEMENT AND CLOSURE WOUND Left 05/26/2022   Procedure: ARM DEBRIDEMENT WITH MYRIAD PLACEMENT AND CHANGE WOUND VAC;  Surgeon: Peggye Form, DO;  [facility];  Service: Plastics;  Laterality: Left;   DEBRIDEMENT AND CLOSURE WOUND Left 06/04/2022   Procedure: ARM DEBRIDEMENT WITH MYRIAD PLACEMENT, wound vac change;  Surgeon: Peggye Form, DO;  Location: [facility];  Service: Plastics;  Laterality: Left;   DEBRIDEMENT AND CLOSURE WOUND Left 07/28/2022   Procedure: Left arm wound excision with myriad and skin graft placement with VAC;  Surgeon: Peggye Form, DO;  Location: [facility];  Service: Plastics;  Laterality: Left;   hip replacements Bilateral    INCISION AND DRAINAGE OF WOUND Left 05/20/2022   Procedure: IRRIGATION AND DEBRIDEMENT AND WOUND VAC PLACEMENT LEFT ARM;  Surgeon: Ramon Dredge, MD;  Location: [facility];  Service: Orthopedics;  Laterality: Left;   MINOR  IRRIGATION AND DEBRIDEMENT OF WOUND Right 05/20/2022   Procedure: IRRIGATION AND DEBRIDEMENT OF WOUND RIGHT ARM;  Surgeon: Ramon Dredge, MD;  [facility];  Service: Orthopedics;  Laterality: Right;   SKIN SPLIT GRAFT Left 07/28/2022   Procedure: SKIN GRAFT SPLIT THICKNESS;  Surgeon: Peggye Form, DO;  Location: [facility];  Service: Plastics;  Laterality: Left;    Family History  Problem Relation Age of Onset   Breast cancer Neg Hx     Social History   Social History Narrative   Not on file     Review of Systems General: Denies fevers or chills Cardio: Denies chest pain Pulmonary: Denies difficulty breathing  Physical Exam    09/03/2022   11:38 AM 09/03/2022   11:37 AM 08/18/2022    3:11 PM  Vitals with BMI  Height 5\' 1"     Weight 140 lbs    BMI 26.47    Systolic  122 142  Diastolic  85 83  Pulse  78 77    General:  No acute distress, nontoxic appearing  Respiratory: No increased work of breathing Neuro: Alert and oriented Psychiatric: Normal mood and affect   Assessment/Plan ***  Evelena Leyden PA-C 09/16/2022, 1:00 PM

## 2022-09-17 ENCOUNTER — Encounter: Payer: Medicare HMO | Admitting: Physician Assistant

## 2022-09-24 ENCOUNTER — Encounter: Payer: Medicare HMO | Admitting: Physician Assistant

## 2022-11-04 ENCOUNTER — Ambulatory Visit: Payer: Medicare HMO | Admitting: Plastic Surgery

## 2022-11-04 ENCOUNTER — Ambulatory Visit: Payer: Medicare HMO | Admitting: Student

## 2022-11-05 DIAGNOSIS — Z1231 Encounter for screening mammogram for malignant neoplasm of breast: Secondary | ICD-10-CM | POA: Diagnosis not present

## 2022-11-05 LAB — HM MAMMOGRAPHY

## 2022-11-06 DIAGNOSIS — S5422XA Injury of radial nerve at forearm level, left arm, initial encounter: Secondary | ICD-10-CM | POA: Insufficient documentation

## 2022-11-06 DIAGNOSIS — R9389 Abnormal findings on diagnostic imaging of other specified body structures: Secondary | ICD-10-CM | POA: Diagnosis not present

## 2022-11-06 DIAGNOSIS — S548X2D Unspecified injury of other nerves at forearm level, left arm, subsequent encounter: Secondary | ICD-10-CM | POA: Diagnosis not present

## 2022-11-06 DIAGNOSIS — S4422XA Injury of radial nerve at upper arm level, left arm, initial encounter: Secondary | ICD-10-CM | POA: Diagnosis not present

## 2022-11-06 DIAGNOSIS — S52602B Unspecified fracture of lower end of left ulna, initial encounter for open fracture type I or II: Secondary | ICD-10-CM | POA: Diagnosis not present

## 2022-11-11 ENCOUNTER — Ambulatory Visit (INDEPENDENT_AMBULATORY_CARE_PROVIDER_SITE_OTHER): Payer: Self-pay | Admitting: Plastic Surgery

## 2022-11-11 ENCOUNTER — Encounter: Payer: Self-pay | Admitting: Plastic Surgery

## 2022-11-11 VITALS — BP 145/82 | HR 65 | Ht 61.0 in | Wt 152.2 lb

## 2022-11-11 DIAGNOSIS — W540XXD Bitten by dog, subsequent encounter: Secondary | ICD-10-CM

## 2022-11-11 DIAGNOSIS — S41152D Open bite of left upper arm, subsequent encounter: Secondary | ICD-10-CM

## 2022-11-11 DIAGNOSIS — M25522 Pain in left elbow: Secondary | ICD-10-CM | POA: Diagnosis not present

## 2022-11-11 DIAGNOSIS — M79602 Pain in left arm: Secondary | ICD-10-CM | POA: Diagnosis not present

## 2022-11-11 NOTE — Progress Notes (Signed)
Subjective:    Patient ID: April Bowman, female    DOB: [birth year], 67 y.o.   MRN: [id number]  The patient is a 67 year old female here for follow-up on her left arm wound.  In May 2024 the patient was taken to the emergency room with a very severe dog bite.  She underwent a surgical cleanout and then was referred to the plastic surgery team.  She was taken to the OR 3 times for excision, debridement and myriad placement.  Then in July we were able to do a skin graft.  The area is healed.  She does not like the way it looks.  No sign of infection or any skin breakdown.  It is a little bit painful.  She is starting to get back some range of motion of her fingers and her elbow.  She is also getting back some feeling in her fingers but does not have it in her thumb yet.  She is set up to start physical therapy.      Review of Systems  Constitutional:  Positive for activity change. Negative for appetite change.  Eyes: Negative.   Respiratory: Negative.    Cardiovascular: Negative.   Gastrointestinal: Negative.   Endocrine: Negative.   Genitourinary: Negative.   Musculoskeletal: Negative.        Objective:   Physical Exam Constitutional:      Appearance: Normal appearance.  HENT:     Head: Atraumatic.  Cardiovascular:     Rate and Rhythm: Normal rate.     Pulses: Normal pulses.  Pulmonary:     Effort: Pulmonary effort is normal.  Musculoskeletal:        General: Tenderness and signs of injury present.  Skin:    Coloration: Skin is not jaundiced.     Findings: No bruising or erythema.  Neurological:     Mental Status: She is oriented to person, place, and time.  Psychiatric:        Mood and Affect: Mood normal.        Behavior: Behavior normal.        Thought Content: Thought content normal.        Judgment: Judgment normal.        Assessment & Plan:     ICD-10-CM   1. Dog bite of left upper extremity, subsequent encounter  S41.152D    W54.0XXD        I would  like her to start massage and work through the physical therapy.  There are certainly some things we can do that may help it to look better but we need to give it some time to see if that will settle out.  Fat grafting is likely an option and release of scar contracture.  Plan for 69-month follow-up.  Pictures were obtained of the patient and placed in the chart with the patient's or guardian's permission.

## 2022-11-19 DIAGNOSIS — M79602 Pain in left arm: Secondary | ICD-10-CM | POA: Diagnosis not present

## 2022-11-19 DIAGNOSIS — M25522 Pain in left elbow: Secondary | ICD-10-CM | POA: Diagnosis not present

## 2022-11-26 DIAGNOSIS — M79602 Pain in left arm: Secondary | ICD-10-CM | POA: Diagnosis not present

## 2022-11-26 DIAGNOSIS — M25522 Pain in left elbow: Secondary | ICD-10-CM | POA: Diagnosis not present

## 2022-12-03 DIAGNOSIS — M25522 Pain in left elbow: Secondary | ICD-10-CM | POA: Diagnosis not present

## 2022-12-03 DIAGNOSIS — M79602 Pain in left arm: Secondary | ICD-10-CM | POA: Diagnosis not present

## 2022-12-08 DIAGNOSIS — M25522 Pain in left elbow: Secondary | ICD-10-CM | POA: Diagnosis not present

## 2022-12-08 DIAGNOSIS — M79602 Pain in left arm: Secondary | ICD-10-CM | POA: Diagnosis not present

## 2022-12-10 ENCOUNTER — Ambulatory Visit: Payer: Medicare HMO | Admitting: Emergency Medicine

## 2022-12-15 DIAGNOSIS — M79602 Pain in left arm: Secondary | ICD-10-CM | POA: Diagnosis not present

## 2022-12-15 DIAGNOSIS — M25522 Pain in left elbow: Secondary | ICD-10-CM | POA: Diagnosis not present

## 2023-01-19 DIAGNOSIS — M79602 Pain in left arm: Secondary | ICD-10-CM | POA: Diagnosis not present

## 2023-01-19 DIAGNOSIS — M25522 Pain in left elbow: Secondary | ICD-10-CM | POA: Diagnosis not present

## 2023-01-22 DIAGNOSIS — M47816 Spondylosis without myelopathy or radiculopathy, lumbar region: Secondary | ICD-10-CM | POA: Diagnosis not present

## 2023-01-22 DIAGNOSIS — E559 Vitamin D deficiency, unspecified: Secondary | ICD-10-CM | POA: Diagnosis not present

## 2023-01-22 DIAGNOSIS — Z23 Encounter for immunization: Secondary | ICD-10-CM | POA: Diagnosis not present

## 2023-01-22 DIAGNOSIS — G4709 Other insomnia: Secondary | ICD-10-CM | POA: Diagnosis not present

## 2023-01-22 DIAGNOSIS — N898 Other specified noninflammatory disorders of vagina: Secondary | ICD-10-CM | POA: Diagnosis not present

## 2023-01-23 DIAGNOSIS — Z780 Asymptomatic menopausal state: Secondary | ICD-10-CM | POA: Diagnosis not present

## 2023-01-26 DIAGNOSIS — M25522 Pain in left elbow: Secondary | ICD-10-CM | POA: Diagnosis not present

## 2023-01-26 DIAGNOSIS — M79602 Pain in left arm: Secondary | ICD-10-CM | POA: Diagnosis not present

## 2023-02-03 DIAGNOSIS — M79602 Pain in left arm: Secondary | ICD-10-CM | POA: Diagnosis not present

## 2023-02-03 DIAGNOSIS — M25522 Pain in left elbow: Secondary | ICD-10-CM | POA: Diagnosis not present

## 2023-02-09 DIAGNOSIS — M25522 Pain in left elbow: Secondary | ICD-10-CM | POA: Diagnosis not present

## 2023-02-09 DIAGNOSIS — M79602 Pain in left arm: Secondary | ICD-10-CM | POA: Diagnosis not present

## 2023-02-17 DIAGNOSIS — M79602 Pain in left arm: Secondary | ICD-10-CM | POA: Diagnosis not present

## 2023-02-17 DIAGNOSIS — M25522 Pain in left elbow: Secondary | ICD-10-CM | POA: Diagnosis not present

## 2023-02-18 DIAGNOSIS — M810 Age-related osteoporosis without current pathological fracture: Secondary | ICD-10-CM | POA: Diagnosis not present

## 2023-02-18 DIAGNOSIS — E559 Vitamin D deficiency, unspecified: Secondary | ICD-10-CM | POA: Diagnosis not present

## 2023-02-23 DIAGNOSIS — R29898 Other symptoms and signs involving the musculoskeletal system: Secondary | ICD-10-CM | POA: Diagnosis not present

## 2023-02-23 DIAGNOSIS — M25522 Pain in left elbow: Secondary | ICD-10-CM | POA: Diagnosis not present

## 2023-02-23 DIAGNOSIS — S4422XD Injury of radial nerve at upper arm level, left arm, subsequent encounter: Secondary | ICD-10-CM | POA: Diagnosis not present

## 2023-02-23 DIAGNOSIS — M79602 Pain in left arm: Secondary | ICD-10-CM | POA: Diagnosis not present

## 2023-02-23 DIAGNOSIS — S52602B Unspecified fracture of lower end of left ulna, initial encounter for open fracture type I or II: Secondary | ICD-10-CM | POA: Diagnosis not present

## 2023-03-02 DIAGNOSIS — G4733 Obstructive sleep apnea (adult) (pediatric): Secondary | ICD-10-CM | POA: Diagnosis not present

## 2023-03-03 DIAGNOSIS — M25522 Pain in left elbow: Secondary | ICD-10-CM | POA: Diagnosis not present

## 2023-03-03 DIAGNOSIS — M79602 Pain in left arm: Secondary | ICD-10-CM | POA: Diagnosis not present

## 2023-03-03 DIAGNOSIS — R29898 Other symptoms and signs involving the musculoskeletal system: Secondary | ICD-10-CM | POA: Diagnosis not present

## 2023-03-10 DIAGNOSIS — M25522 Pain in left elbow: Secondary | ICD-10-CM | POA: Diagnosis not present

## 2023-03-10 DIAGNOSIS — R29898 Other symptoms and signs involving the musculoskeletal system: Secondary | ICD-10-CM | POA: Diagnosis not present

## 2023-03-10 DIAGNOSIS — M79602 Pain in left arm: Secondary | ICD-10-CM | POA: Diagnosis not present

## 2023-03-16 DIAGNOSIS — M25522 Pain in left elbow: Secondary | ICD-10-CM | POA: Diagnosis not present

## 2023-03-16 DIAGNOSIS — R29898 Other symptoms and signs involving the musculoskeletal system: Secondary | ICD-10-CM | POA: Diagnosis not present

## 2023-03-16 DIAGNOSIS — M79602 Pain in left arm: Secondary | ICD-10-CM | POA: Diagnosis not present

## 2023-03-18 ENCOUNTER — Ambulatory Visit: Payer: Medicare HMO | Admitting: Emergency Medicine

## 2023-03-18 ENCOUNTER — Encounter: Payer: Self-pay | Admitting: Emergency Medicine

## 2023-03-18 VITALS — BP 114/68 | HR 68 | Temp 98.3°F | Ht 61.0 in | Wt 157.0 lb

## 2023-03-18 DIAGNOSIS — I700 Atherosclerosis of aorta: Secondary | ICD-10-CM | POA: Diagnosis not present

## 2023-03-18 DIAGNOSIS — F5104 Psychophysiologic insomnia: Secondary | ICD-10-CM

## 2023-03-18 DIAGNOSIS — R32 Unspecified urinary incontinence: Secondary | ICD-10-CM | POA: Diagnosis not present

## 2023-03-18 DIAGNOSIS — G4733 Obstructive sleep apnea (adult) (pediatric): Secondary | ICD-10-CM | POA: Diagnosis not present

## 2023-03-18 DIAGNOSIS — Z7689 Persons encountering health services in other specified circumstances: Secondary | ICD-10-CM

## 2023-03-18 NOTE — Assessment & Plan Note (Signed)
 Well-documented diagnosis in the past On CPAP treatment but finds it very uncomfortable Wants to try new approach like Inspire Referral to sleep studies placed today.

## 2023-03-18 NOTE — Assessment & Plan Note (Signed)
 Diet and nutrition discussed Not taking statin medication at present time Lipid profile not available

## 2023-03-18 NOTE — Progress Notes (Signed)
 [name] 68 y.o.   Chief Complaint  Patient presents with   Establish Care    Patient here for establish care. Patient questions about her insomnia, and wanted to know if she could get meds for her osteoporosis. And wanted a referral for pulmonary so she can get new CPAP supplies     HISTORY OF PRESENT ILLNESS: This is a 68 y.o. female first visit to this office, here to establish care with me. Originally from [place].  Has been in this area for over 20 years Has the following chronic medical conditions: 1.  Chronic insomnia 2.  Obstructive sleep apnea.  Struggles with CPAP masks.  Looking for different approach.  Needs referral. 3.  Recently diagnosed with osteoporosis.  Report not available 4.  Lower urinary tract symptoms including incontinence.  Was recently started on Myrbetriq 50 mg.  HPI   Prior to Admission medications   Medication Sig Start Date End Date Taking? Authorizing Provider  acetaminophen (TYLENOL) 500 MG tablet Take 2 tablets (1,000 mg total) by mouth every 6 (six) hours as needed for mild pain or fever. 05/27/22  Yes Trixie Deis R, PA-C  ASCORBIC ACID PO Take 1 tablet by mouth daily. Vitamin C, unknown strength.   Yes [provider]  Cholecalciferol (VITAMIN D-3 PO) Take 1 capsule by mouth daily.   Yes [provider]  Cyanocobalamin (VITAMIN B-12 PO) Take 1 tablet by mouth daily.   Yes [provider]  estradiol (ESTRACE VAGINAL) 0.1 MG/GM vaginal cream Place 1 g vaginally at bedtime. 05/02/16  Yes [provider]  MAGNESIUM OXIDE PO Take 1 tablet by mouth daily.   Yes [provider]  mirabegron ER (MYRBETRIQ) 50 MG TB24 tablet Take 1 tablet by mouth daily. 03/14/21  Yes [provider]  MORINGA OLEIFERA PO Take 1 capsule by mouth daily.   Yes [provider]  naproxen (NAPROSYN) 500 MG tablet Take 500 mg by mouth as needed for mild pain (pain score 1-3) or moderate pain (pain score 4-6).  03/04/23 04/03/23 Yes [provider]  Omega-3 Fatty Acids (OMEGA 3 500 PO) Take 500 mg by mouth daily at 2 am.   Yes [provider]  OVER THE COUNTER MEDICATION Take 1 tablet by mouth daily. Vitamin A + Vitamin D3 combination supplement.   Yes [provider]  POTASSIUM GLUCONATE PO Take 1 tablet by mouth daily.   Yes [provider]  QUEtiapine (SEROQUEL) 100 MG tablet Take 100 mg by mouth at bedtime.   Yes [provider]  VITAMIN E PO Take 1 capsule by mouth daily.   Yes [provider]  diphenhydrAMINE (BENADRYL) 25 MG tablet Take 1 tablet (25 mg total) by mouth every 8 (eight) hours as needed for up to 5 days. 09/03/22 09/08/22  Maxwell Marion, PA-C  gabapentin (NEURONTIN) 300 MG capsule Take 1 capsule (300 mg total) by mouth 3 (three) times daily. 05/27/22 07/28/22  Juliet Rude, PA-C    No Known Allergies  Patient Active Problem List   Diagnosis Date Noted   Aortic atherosclerosis (HCC) 03/18/2023   Insomnia 06/02/2017   OSA (obstructive sleep apnea) 06/02/2017    Past Medical History:  Diagnosis Date   Arthritis    Osteoporosis    Sleep apnea     Past Surgical History:  Procedure Laterality Date   APPLICATION OF WOUND VAC Left 06/04/2022   Procedure: APPLICATION OF WOUND VAC;  Surgeon: Peggye Form, DO;  [facility];  Service: Government social research officer;  Laterality: Left;   APPLICATION OF WOUND VAC Left 06/12/2022   Procedure: APPLICATION OF WOUND VAC;  Surgeon: Peggye Form, DO;  Location: [facility];  Service: Plastics;  Laterality: Left;   APPLICATION OF WOUND VAC Left 07/28/2022   Procedure: APPLICATION OF WOUND VAC;  Surgeon: Peggye Form, DO;  Location: [facility];  Service: Plastics;  Laterality: Left;   AUGMENTATION MAMMAPLASTY     DEBRIDEMENT AND CLOSURE WOUND Left 05/26/2022   Procedure: ARM DEBRIDEMENT WITH MYRIAD PLACEMENT AND CHANGE WOUND VAC;   Surgeon: Peggye Form, DO;  [facility];  Service: Plastics;  Laterality: Left;   DEBRIDEMENT AND CLOSURE WOUND Left 06/04/2022   Procedure: ARM DEBRIDEMENT WITH MYRIAD PLACEMENT, wound vac change;  Surgeon: Peggye Form, DO;  Location: [facility];  Service: Plastics;  Laterality: Left;   DEBRIDEMENT AND CLOSURE WOUND Left 07/28/2022   Procedure: Left arm wound excision with myriad and skin graft placement with VAC;  Surgeon: Peggye Form, DO;  Location: [facility];  Service: Plastics;  Laterality: Left;   hip replacements Bilateral    INCISION AND DRAINAGE OF WOUND Left 05/20/2022   Procedure: IRRIGATION AND DEBRIDEMENT AND WOUND VAC PLACEMENT LEFT ARM;  Surgeon: Ramon Dredge, MD;  [facility];  Service: Orthopedics;  Laterality: Left;   MINOR IRRIGATION AND DEBRIDEMENT OF WOUND Right 05/20/2022   Procedure: IRRIGATION AND DEBRIDEMENT OF WOUND RIGHT ARM;  Surgeon: Ramon Dredge, MD;  [facility];  Service: Orthopedics;  Laterality: Right;   SKIN SPLIT GRAFT Left 07/28/2022   Procedure: SKIN GRAFT SPLIT THICKNESS;  Surgeon: Peggye Form, DO;  Location: [facility];  Service: Plastics;  Laterality: Left;    Social History   Socioeconomic History   Marital status: Married    Spouse name: Annamary Carolin   Number of children: Not on file   Years of education: Not on file   Highest education level: 7th grade  Occupational History   Not on file  Tobacco Use   Smoking status: Former    Current packs/day: 0.00    Average packs/day: 0.5 packs/day for 35.0 years (17.5 ttl pk-yrs)    Types: Cigarettes    Start date: 04/03/1975    Quit date: 04/03/2010    Years since quitting: 12.9   Smokeless tobacco: Never  Vaping Use   Vaping status: Never Used  Substance and Sexual Activity   Alcohol use: Yes    Alcohol/week: 6.0 standard drinks of alcohol    Types: 6 Cans of beer per week    Comment: 6  beers in a week   Drug use: Never   Sexual activity: Yes    Birth control/protection: None  Other Topics Concern   Not on file  Social History Narrative   Not on file   Social Drivers of Health   Financial Resource Strain: Not on file  Food Insecurity: Low Risk  (02/18/2023)   Received from [facility]   Hunger Vital Sign    Worried About Running Out of Food in the Last Year: Never true    Ran Out of Food in the Last Year: Never true  Transportation Needs: No Transportation Needs (02/18/2023)   Received from [facility]    In the past 12 months, has lack of reliable transportation kept you from medical appointments, meetings, work or from getting things needed for daily living? :  No  Physical Activity: Not on file  Stress: Not on file  Social Connections: Not on file  Intimate Partner Violence: Not At Risk (05/21/2022)   Humiliation, Afraid, Rape, and Kick questionnaire    Fear of Current or Ex-Partner: No    Emotionally Abused: No    Physically Abused: No    Sexually Abused: No    Family History  Problem Relation Age of Onset   Breast cancer Neg Hx      Review of Systems  Constitutional: Negative.  Negative for chills and fever.  HENT: Negative.  Negative for congestion and sore throat.   Respiratory: Negative.  Negative for cough and shortness of breath.   Cardiovascular: Negative.  Negative for chest pain and palpitations.  Gastrointestinal:  Negative for abdominal pain, diarrhea, nausea and vomiting.  Genitourinary: Negative.  Negative for dysuria and hematuria.  Skin: Negative.  Negative for rash.  Neurological: Negative.  Negative for dizziness and headaches.  Psychiatric/Behavioral:  The patient has insomnia.   All other systems reviewed and are negative.   Vitals:   03/18/23 1026  BP: 114/68  Pulse: 68  Temp: 98.3 F (36.8 C)  SpO2: 95%    Physical Exam Vitals reviewed.  Constitutional:      Appearance: Normal appearance.  HENT:      Head: Normocephalic.  Eyes:     Extraocular Movements: Extraocular movements intact.  Cardiovascular:     Rate and Rhythm: Normal rate and regular rhythm.     Pulses: Normal pulses.     Heart sounds: Normal heart sounds.  Pulmonary:     Effort: Pulmonary effort is normal.     Breath sounds: Normal breath sounds.  Skin:    General: Skin is warm and dry.  Neurological:     General: No focal deficit present.     Mental Status: She is alert and oriented to person, place, and time.  Psychiatric:        Mood and Affect: Mood normal.        Behavior: Behavior normal.      ASSESSMENT & PLAN: A total of 48 minutes was spent with the patient and counseling/coordination of care regarding preparing for this visit, review of available medical records, establishing care with me, comprehensive history and physical examination, review of multiple chronic medical conditions and their management, review of all medications, review of most recent blood work results, review of health maintenance items, education and nutrition, prognosis, documentation and need for follow-up  Problem List Items Addressed This Visit       Cardiovascular and Mediastinum   Aortic atherosclerosis (HCC)   Diet and nutrition discussed Not taking statin medication at present time Lipid profile not available        Respiratory   OSA (obstructive sleep apnea) - Primary   Well-documented diagnosis in the past On CPAP treatment but finds it very uncomfortable Wants to try new approach like Inspire Referral to sleep studies placed today.      Relevant Orders   Ambulatory referral to Sleep Studies     Other   Insomnia   Chronic and psychophysiological in nature. Has tried different over-the-counter and prescription medications with marginal results.  Melatonin discussed. Mental health discussed.  Denies depression or anxiety. May benefit from behavioral health evaluation Referral placed today.      Relevant  Orders   Ambulatory referral to Psychiatry   Urinary incontinence   States she was evaluated by urologist for this and prescribed Myrbetriq 50 mg  daily. Records not available for my review.      Relevant Medications   mirabegron ER (MYRBETRIQ) 50 MG TB24 tablet   Other Visit Diagnoses       Encounter to establish care          Patient Instructions  Mantenimiento de la salud despus de los 65 aos de edad Health Maintenance After Age 64 Despus de los 65 aos de edad, corre un riesgo mayor de Film/video editor enfermedades e infecciones a Air cabin crew, como tambin de sufrir lesiones por cadas. Las cadas son la causa principal de las fracturas de huesos y lesiones en la cabeza de personas mayores de 65 aos de edad. Recibir cuidados preventivos de forma regular puede ayudarlo a mantenerse saludable y en buen [place]. Los cuidados preventivos incluyen realizarse anlisis de forma regular y Forensic psychologist en el estilo de vida segn las recomendaciones del mdico. Converse con el mdico sobre lo siguiente: Las pruebas de deteccin y los anlisis que debe International aid/development worker. Una prueba de deteccin es un estudio que se para Engineer, manufacturing la presencia de una enfermedad cuando no tiene sntomas. Un plan de dieta y ejercicios adecuado para usted. Qu debo saber sobre las pruebas de deteccin y los anlisis para prevenir cadas? Realizarse pruebas de deteccin y [employer] es la mejor manera de Engineer, manufacturing un problema de salud de forma temprana. El diagnstico y tratamiento tempranos le brindan la mejor oportunidad de Chief Operating Officer las afecciones mdicas que son comunes despus de los 65 aos de edad. Ciertas afecciones y elecciones de estilo de vida pueden hacer que sea ms propenso a sufrir Engineer, site. El mdico puede recomendarle lo siguiente: Controles regulares de la visin. Una visin deficiente y afecciones como las cataratas pueden hacer que sea ms propenso a sufrir Engineer, site. Si [place] lentes, asegrese de obtener  una receta actualizada si su visin cambia. Revisin de medicamentos. Revise regularmente con el mdico todos los medicamentos que toma, incluidos los medicamentos de [place]. Consulte al [employer] efectos secundarios que pueden hacer que sea ms propenso a sufrir Engineer, site. Informe al mdico si alguno de los medicamentos que toma lo hace sentir mareado o somnoliento. Controles de fuerza y equilibrio. El mdico puede recomendar ciertos estudios para controlar su fuerza y equilibrio al estar de pie, al caminar o al cambiar de posicin. Examen de los pies. El dolor y Materials engineer en los pies, como tambin no utilizar el calzado [place], pueden hacer que sea ms propenso a sufrir Engineer, site. Pruebas de deteccin, que incluyen las siguientes: Pruebas de deteccin para la osteoporosis. La osteoporosis es una afeccin que hace que los huesos se tornen ms dbiles y se quiebren con ms facilidad. Pruebas de deteccin para la presin arterial. Los cambios en la presin arterial y los medicamentos para Chief Operating Officer la presin arterial pueden hacerlo sentir mareado. Prueba de deteccin de la depresin. Es ms probable que sufra una cada si tiene miedo a caerse, se siente deprimido o se siente incapaz de Probation officer. Prueba de deteccin de consumo de alcohol. Beber demasiado alcohol puede afectar su equilibrio y puede hacer que sea ms propenso a sufrir Engineer, site. Siga estas indicaciones en su casa: Estilo de vida No beba alcohol si: Su mdico le indica no hacerlo. Si bebe alcohol: Limite la cantidad que bebe a lo siguiente: De 0 a 1 medida por da para las mujeres. De 0 a 2 medidas por da para los hombres. Sepa cunta cantidad de alcohol hay en  las bebidas que toma. En los [address], una medida equivale a una botella de cerveza de 12 oz (355 ml), un vaso de vino de 5 oz (148 ml) o un vaso de una bebida alcohlica de alta graduacin de 1 oz (44 ml). No consuma  ningn producto que contenga nicotina o tabaco. Estos productos incluyen cigarrillos, tabaco para Theatre manager y aparatos de vapeo, como los Administrator, Civil Service. Si necesita ayuda para dejar de consumir estos productos, consulte al [employer]. Actividad  Siga un programa de ejercicio regular para mantenerse en forma. Esto lo ayudar a Radio producer equilibrio. Consulte al mdico qu tipos de ejercicios son adecuados para usted. Si necesita un bastn o un andador, selo segn las recomendaciones del mdico. Utilice calzado con buen apoyo y suela antideslizante. Seguridad  Retire los [employer] puedan causar tropiezos tales como alfombras, cables u obstculos. Instale equipos de seguridad, como barras para sostn en los baos y barandas de seguridad en las escaleras. Mantenga las habitaciones y los pasillos bien iluminados. Indicaciones generales Hable con el mdico sobre sus riesgos de sufrir una cada. Infrmele a su mdico si: Se cae. Asegrese de informarle a su mdico acerca de todas las cadas, incluso aquellas que parecen ser [employer]. Se siente mareado, cansado (tiene fatiga) o siente que pierde el equilibrio. Use los medicamentos de venta libre y los recetados solamente como se lo haya indicado el mdico. Estos incluyen suplementos. Siga una dieta sana y [place] un peso saludable. Una dieta saludable incluye productos lcteos descremados, carnes bajas en contenido de grasa ([place]), fibra de granos enteros, frijoles y [place] frutas y verduras. Mantngase al da con las vacunas. Realcese los estudios de rutina de la salud, dentales y de Wellsite geologist. Resumen Tener un estilo de vida saludable y recibir cuidados preventivos pueden ayudar a Research scientist (physical sciences) salud y el bienestar despus de los 65 aos de [place]. Realizarse pruebas de deteccin y [employer] es la mejor manera de Engineer, manufacturing un problema de salud de forma temprana y [place]ber [place] a Automotive engineer una cada. El diagnstico y tratamiento tempranos le brindan la  mejor oportunidad de Chief Operating Officer las afecciones mdicas ms comunes en las personas mayores de 65 aos de edad. Las cadas son la causa principal de las fracturas de huesos y lesiones en la cabeza de personas mayores de 65 aos de edad. Tome precauciones para evitar una cada en su casa. Trabaje con el mdico para saber qu cambios que puede hacer para mejorar su salud y [place], y para prevenir las cadas. Esta informacin no tiene Theme park manager el consejo del mdico. Asegrese de hacerle al mdico cualquier pregunta que tenga. Document Revised: 06/06/2020 Document Reviewed: 06/06/2020 Elsevier Patient Education  2024 Elsevier Inc.    Edwina Barth, MD [facility]

## 2023-03-18 NOTE — Assessment & Plan Note (Addendum)
 States she was evaluated by urologist for this and prescribed Myrbetriq 50 mg daily. Records not available for my review.

## 2023-03-18 NOTE — Patient Instructions (Signed)
 Mantenimiento de la salud despus de los 65 aos de edad Health Maintenance After Age 67 Despus de los 65 aos de edad, corre un riesgo mayor de Film/video editor enfermedades e infecciones a Air cabin crew, como tambin de sufrir lesiones por cadas. Las cadas son la causa principal de las fracturas de huesos y lesiones en la cabeza de personas mayores de 65 aos de edad. Recibir cuidados preventivos de forma regular puede ayudarlo a mantenerse saludable y en buen [place]. Los cuidados preventivos incluyen realizarse anlisis de forma regular y Forensic psychologist en el estilo de vida segn las recomendaciones del mdico. Converse con el mdico sobre lo siguiente: Las pruebas de deteccin y los anlisis que debe International aid/development worker. Una prueba de deteccin es un estudio que se para Engineer, manufacturing la presencia de una enfermedad cuando no tiene sntomas. Un plan de dieta y ejercicios adecuado para usted. Qu debo saber sobre las pruebas de deteccin y los anlisis para prevenir cadas? Realizarse pruebas de deteccin y [employer] es la mejor manera de Engineer, manufacturing un problema de salud de forma temprana. El diagnstico y tratamiento tempranos le brindan la mejor oportunidad de Chief Operating Officer las afecciones mdicas que son comunes despus de los 65 aos de edad. Ciertas afecciones y elecciones de estilo de vida pueden hacer que sea ms propenso a sufrir Engineer, site. El mdico puede recomendarle lo siguiente: Controles regulares de la visin. Una visin deficiente y afecciones como las cataratas pueden hacer que sea ms propenso a sufrir Engineer, site. Si [place] lentes, asegrese de obtener una receta actualizada si su visin cambia. Revisin de medicamentos. Revise regularmente con el mdico todos los medicamentos que toma, incluidos los medicamentos de [place]. Consulte al [employer] efectos secundarios que pueden hacer que sea ms propenso a sufrir Engineer, site. Informe al mdico si alguno de los medicamentos que toma lo hace sentir mareado o  somnoliento. Controles de fuerza y equilibrio. El mdico puede recomendar ciertos estudios para controlar su fuerza y equilibrio al estar de pie, al caminar o al cambiar de posicin. Examen de los pies. El dolor y Materials engineer en los pies, como tambin no utilizar el calzado [place], pueden hacer que sea ms propenso a sufrir Engineer, site. Pruebas de deteccin, que incluyen las siguientes: Pruebas de deteccin para la osteoporosis. La osteoporosis es una afeccin que hace que los huesos se tornen ms dbiles y se quiebren con ms facilidad. Pruebas de deteccin para la presin arterial. Los cambios en la presin arterial y los medicamentos para Chief Operating Officer la presin arterial pueden hacerlo sentir mareado. Prueba de deteccin de la depresin. Es ms probable que sufra una cada si tiene miedo a caerse, se siente deprimido o se siente incapaz de Probation officer. Prueba de deteccin de consumo de alcohol. Beber demasiado alcohol puede afectar su equilibrio y puede hacer que sea ms propenso a sufrir Engineer, site. Siga estas indicaciones en su casa: Estilo de vida No beba alcohol si: Su mdico le indica no hacerlo. Si bebe alcohol: Limite la cantidad que bebe a lo siguiente: De 0 a 1 medida por da para las mujeres. De 0 a 2 medidas por da para los hombres. Sepa cunta cantidad de alcohol hay en las bebidas que toma. En los [address], una medida equivale a una botella de cerveza de 12 oz (355 ml), un vaso de vino de 5 oz (148 ml) o un vaso de una bebida alcohlica de alta graduacin de 1 oz (44 ml). No consuma ningn producto que  contenga nicotina o tabaco. Estos productos incluyen cigarrillos, tabaco para Theatre manager y aparatos de vapeo, como los Administrator, Civil Service. Si necesita ayuda para dejar de consumir estos productos, consulte al [employer]. Actividad  Siga un programa de ejercicio regular para mantenerse en forma. Esto lo ayudar a Radio producer equilibrio. Consulte al  mdico qu tipos de ejercicios son adecuados para usted. Si necesita un bastn o un andador, selo segn las recomendaciones del mdico. Utilice calzado con buen apoyo y suela antideslizante. Seguridad  Retire los [employer] puedan causar tropiezos tales como alfombras, cables u obstculos. Instale equipos de seguridad, como barras para sostn en los baos y barandas de seguridad en las escaleras. Mantenga las habitaciones y los pasillos bien iluminados. Indicaciones generales Hable con el mdico sobre sus riesgos de sufrir una cada. Infrmele a su mdico si: Se cae. Asegrese de informarle a su mdico acerca de todas las cadas, incluso aquellas que parecen ser [employer]. Se siente mareado, cansado (tiene fatiga) o siente que pierde el equilibrio. Use los medicamentos de venta libre y los recetados solamente como se lo haya indicado el mdico. Estos incluyen suplementos. Siga una dieta sana y [place] un peso saludable. Una dieta saludable incluye productos lcteos descremados, carnes bajas en contenido de grasa ([place]), fibra de granos enteros, frijoles y [place] frutas y verduras. Mantngase al da con las vacunas. Realcese los estudios de rutina de la salud, dentales y de Wellsite geologist. Resumen Tener un estilo de vida saludable y recibir cuidados preventivos pueden ayudar a Research scientist (physical sciences) salud y el bienestar despus de los 65 aos de [place]. Realizarse pruebas de deteccin y [employer] es la mejor manera de Engineer, manufacturing un problema de salud de forma temprana y [place] a Automotive engineer una cada. El diagnstico y tratamiento tempranos le brindan la mejor oportunidad de Chief Operating Officer las afecciones mdicas ms comunes en las personas mayores de 65 aos de edad. Las cadas son la causa principal de las fracturas de huesos y lesiones en la cabeza de personas mayores de 65 aos de edad. Tome precauciones para evitar una cada en su casa. Trabaje con el mdico para saber qu cambios que puede hacer para mejorar su salud y  [place], y para prevenir las cadas. Esta informacin no tiene Theme park manager el consejo del mdico. Asegrese de hacerle al mdico cualquier pregunta que tenga. Document Revised: 06/06/2020 Document Reviewed: 06/06/2020 Elsevier Patient Education  2024 [employer].

## 2023-03-18 NOTE — Assessment & Plan Note (Signed)
 Chronic and psychophysiological in nature. Has tried different over-the-counter and prescription medications with marginal results.  Melatonin discussed. Mental health discussed.  Denies depression or anxiety. May benefit from behavioral health evaluation Referral placed today.

## 2023-04-13 DIAGNOSIS — M79602 Pain in left arm: Secondary | ICD-10-CM | POA: Diagnosis not present

## 2023-04-13 DIAGNOSIS — M25522 Pain in left elbow: Secondary | ICD-10-CM | POA: Diagnosis not present

## 2023-04-13 DIAGNOSIS — R29898 Other symptoms and signs involving the musculoskeletal system: Secondary | ICD-10-CM | POA: Diagnosis not present

## 2023-04-21 DIAGNOSIS — R29898 Other symptoms and signs involving the musculoskeletal system: Secondary | ICD-10-CM | POA: Diagnosis not present

## 2023-04-21 DIAGNOSIS — M25522 Pain in left elbow: Secondary | ICD-10-CM | POA: Diagnosis not present

## 2023-04-21 DIAGNOSIS — M79602 Pain in left arm: Secondary | ICD-10-CM | POA: Diagnosis not present

## 2023-04-23 ENCOUNTER — Telehealth: Payer: Self-pay | Admitting: *Deleted

## 2023-04-23 NOTE — Telephone Encounter (Signed)
 Copied from CRM [id number]. Topic: Referral - Question >> Apr 23, 2023 12:06 PM Elizebeth Brooking wrote: Reason for CRM: Patient called in stating when she saw the doctor, they were suppose to send over a referral to a specialist , wanted to know the status on this and when will someone call her back regarding this

## 2023-04-24 ENCOUNTER — Other Ambulatory Visit: Payer: Self-pay | Admitting: Radiology

## 2023-04-24 DIAGNOSIS — G4733 Obstructive sleep apnea (adult) (pediatric): Secondary | ICD-10-CM

## 2023-04-24 NOTE — Telephone Encounter (Signed)
 Spoke with patient informed her the first referral was appropriate and placed a new referral to previous location with Dr. Vassie Loll

## 2023-04-28 DIAGNOSIS — M25522 Pain in left elbow: Secondary | ICD-10-CM | POA: Diagnosis not present

## 2023-04-28 DIAGNOSIS — M79602 Pain in left arm: Secondary | ICD-10-CM | POA: Diagnosis not present

## 2023-04-28 DIAGNOSIS — R29898 Other symptoms and signs involving the musculoskeletal system: Secondary | ICD-10-CM | POA: Diagnosis not present

## 2023-05-07 DIAGNOSIS — M79602 Pain in left arm: Secondary | ICD-10-CM | POA: Diagnosis not present

## 2023-05-07 DIAGNOSIS — R29898 Other symptoms and signs involving the musculoskeletal system: Secondary | ICD-10-CM | POA: Diagnosis not present

## 2023-05-07 DIAGNOSIS — M25522 Pain in left elbow: Secondary | ICD-10-CM | POA: Diagnosis not present

## 2023-05-11 DIAGNOSIS — R29898 Other symptoms and signs involving the musculoskeletal system: Secondary | ICD-10-CM | POA: Diagnosis not present

## 2023-05-11 DIAGNOSIS — M25522 Pain in left elbow: Secondary | ICD-10-CM | POA: Diagnosis not present

## 2023-05-11 DIAGNOSIS — M79602 Pain in left arm: Secondary | ICD-10-CM | POA: Diagnosis not present

## 2023-05-12 ENCOUNTER — Ambulatory Visit: Payer: Medicare HMO | Admitting: Plastic Surgery

## 2023-05-12 ENCOUNTER — Encounter: Payer: Self-pay | Admitting: Plastic Surgery

## 2023-05-12 VITALS — BP 151/82 | HR 83

## 2023-05-12 DIAGNOSIS — S41152S Open bite of left upper arm, sequela: Secondary | ICD-10-CM | POA: Diagnosis not present

## 2023-05-12 DIAGNOSIS — L905 Scar conditions and fibrosis of skin: Secondary | ICD-10-CM

## 2023-05-12 DIAGNOSIS — W540XXS Bitten by dog, sequela: Secondary | ICD-10-CM

## 2023-05-12 DIAGNOSIS — W540XXD Bitten by dog, subsequent encounter: Secondary | ICD-10-CM

## 2023-05-12 NOTE — Progress Notes (Signed)
   Subjective:    Patient ID: Gerry Krone, female    DOB: 1955/07/05, 68 y.o.   MRN: [id number]  The patient is a 68 yrs old female here for evaluation of her left arm.  She was bitten by a dog one year ago.  She underwent extensive reconstruction and skin grafting.  She presents today with concerns about contracture and difficulty keep the area clean due to the scar and folding of the skin.  The area is ~ 100 cm2 at the elbow.    Review of Systems  Constitutional:  Positive for activity change.  Eyes: Negative.   Respiratory: Negative.    Cardiovascular: Negative.   Gastrointestinal: Negative.   Endocrine: Negative.   Genitourinary: Negative.        Objective:   Physical Exam Constitutional:      Appearance: Normal appearance.  Cardiovascular:     Rate and Rhythm: Normal rate.     Pulses: Normal pulses.  Pulmonary:     Effort: Pulmonary effort is normal.  Musculoskeletal:        General: Tenderness and deformity present. No swelling.  Skin:    General: Skin is warm.     Coloration: Skin is not jaundiced or pale.     Findings: Lesion present.  Neurological:     Mental Status: She is alert and oriented to person, place, and time.  Psychiatric:        Mood and Affect: Mood normal.        Behavior: Behavior normal.        Thought Content: Thought content normal.        Judgment: Judgment normal.        Assessment & Plan:  Dog bite of left upper extremity, subsequent encounter  I believe we can improve the arm skin contracture.  I can't eliminate the scar or the area of grafting.  Patient expressed understanding. Pictures were obtained of the patient and placed in the chart with the patient's or guardian's permission.  Plan for excision of left arm contracture with tissue advancement 100 cm.

## 2023-05-18 ENCOUNTER — Ambulatory Visit (INDEPENDENT_AMBULATORY_CARE_PROVIDER_SITE_OTHER)

## 2023-05-18 ENCOUNTER — Telehealth: Payer: Self-pay | Admitting: Emergency Medicine

## 2023-05-18 VITALS — Ht 61.0 in | Wt 157.0 lb

## 2023-05-18 DIAGNOSIS — Z1211 Encounter for screening for malignant neoplasm of colon: Secondary | ICD-10-CM

## 2023-05-18 DIAGNOSIS — Z0000 Encounter for general adult medical examination without abnormal findings: Secondary | ICD-10-CM

## 2023-05-18 DIAGNOSIS — Z1159 Encounter for screening for other viral diseases: Secondary | ICD-10-CM

## 2023-05-18 NOTE — Patient Instructions (Addendum)
 April Bowman , Thank you for taking time to come for your Medicare Wellness Visit. I appreciate your ongoing commitment to your health goals. Please review the following plan we discussed and let me know if I can assist you in the future.   Referrals/Orders/Follow-Ups/Clinician Recommendations: It was nice to speak with today.  Remember to ask for mammogram and bone density results to be sent to Dr. Hilaria Loveless office.  Please call [facility], at [phone], to schedule a colonoscopy.   This is a list of the screening recommended for you and due dates:  Health Maintenance  Topic Date Due   Hepatitis C Screening  Never done   Colon Cancer Screening  Never done   Mammogram  09/24/2019   DEXA scan (bone density measurement)  Never done   Medicare Annual Wellness Visit  08/27/2022   Zoster (Shingles) Vaccine (2 of 2) 12/15/2022   COVID-19 Vaccine (6 - 2024-25 season) 04/20/2023   Flu Shot  08/14/2023   DTaP/Tdap/Td vaccine (3 - Td or Tdap) 05/19/2032   Pneumonia Vaccine  Completed   HPV Vaccine  Aged Out   Meningitis B Vaccine  Aged Out    Advanced directives: (Declined) Advance directive discussed with you today. Even though you declined this today, please call our office should you change your mind, and we can give you the proper paperwork for you to fill out.  Next Medicare Annual Wellness Visit scheduled for next year: Yes  Have you seen your provider in the last 6 months (3 months if uncontrolled diabetes)? Yes

## 2023-05-18 NOTE — Telephone Encounter (Signed)
 I have seen her only once.  She did mention a history of osteoporosis but no report available.  We probably did go over medications like Prolia.  Please refer to osteoporosis clinic.  Thanks.

## 2023-05-18 NOTE — Telephone Encounter (Signed)
 Copied from CRM [id number]. Topic: Clinical - Medication Question >> May 18, 2023 10:26 AM April Bowman wrote: Reason for CRM: pt called to speak with provider regarding arteritis injection. Pt states per her last visit with provider they discussed the injection, she has not heard from the provider . Please give pt a call back. 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

## 2023-05-18 NOTE — Progress Notes (Signed)
 Subjective:   Poonam Tonga is a 68 y.o. who presents for a Medicare Wellness preventive visit.  Visit Complete: Virtual I connected with  [name] on 05/18/23 by a audio enabled telemedicine application and verified that I am speaking with the correct person using two identifiers.  Patient Location: Home  Provider Location: Home Office  I discussed the limitations of evaluation and management by telemedicine. The patient expressed understanding and agreed to proceed.  Vital Signs: Because this visit was a virtual/telehealth visit, some criteria may be missing or patient reported. Any vitals not documented were not able to be obtained and vitals that have been documented are patient reported.  VideoDeclined- This patient declined Librarian, academic. Therefore the visit was completed with audio only.  Persons Participating in Visit: Patient.  AWV Questionnaire: No: Patient Medicare AWV questionnaire was not completed prior to this visit.  Cardiac Risk Factors include: advanced age (>23men, >26 women);Other (see comment), Risk factor comments: OSA,  Aortic atherosclerosis     Objective:    Today's Vitals   05/18/23 1447  Weight: 157 lb (71.2 kg)  Height: 5\' 1"  (1.549 m)   Body mass index is 29.66 kg/m.     05/18/2023    3:09 PM 07/28/2022   10:13 AM 07/21/2022   10:09 AM 06/12/2022    8:31 AM 06/04/2022    8:15 AM 05/21/2022    4:00 AM 09/03/2021    7:44 AM  Advanced Directives  Does Patient Have a Medical Advance Directive? No No No No No No No  Would patient like information on creating a medical advance directive?  No - Patient declined No - Patient declined No - Patient declined Yes (MAU/Ambulatory/Procedural Areas - Information given) No - Patient declined     Current Medications (verified) Outpatient Encounter Medications as of 05/18/2023  Medication Sig   acetaminophen  (TYLENOL ) 500 MG tablet Take 2 tablets (1,000 mg total) by mouth  every 6 (six) hours as needed for mild pain or fever.   ASCORBIC ACID PO Take 1 tablet by mouth daily. Vitamin C, unknown strength.   Cholecalciferol  (VITAMIN D-3 PO) Take 1 capsule by mouth daily.   conjugated estrogens (PREMARIN) vaginal cream Place vaginally.   Cyanocobalamin (VITAMIN B-12 PO) Take 1 tablet by mouth daily.   estradiol (ESTRACE VAGINAL) 0.1 MG/GM vaginal cream Place 1 g vaginally at bedtime.   MAGNESIUM  OXIDE PO Take 1 tablet by mouth daily.   mirabegron ER (MYRBETRIQ) 50 MG TB24 tablet Take 1 tablet by mouth daily.   MORINGA OLEIFERA PO Take 1 capsule by mouth daily.   Omega-3 Fatty Acids (OMEGA 3 500 PO) Take 500 mg by mouth daily at 2 am.   OVER THE COUNTER MEDICATION Take 1 tablet by mouth daily. Vitamin A + Vitamin D3 combination supplement.   POTASSIUM GLUCONATE PO Take 1 tablet by mouth daily.   QUEtiapine  (SEROQUEL ) 100 MG tablet Take 100 mg by mouth at bedtime.   VITAMIN E PO Take 1 capsule by mouth daily.   diphenhydrAMINE  (BENADRYL ) 25 MG tablet Take 1 tablet (25 mg total) by mouth every 8 (eight) hours as needed for up to 5 days.   gabapentin  (NEURONTIN ) 300 MG capsule Take 1 capsule (300 mg total) by mouth 3 (three) times daily.   No facility-administered encounter medications on file as of 05/18/2023.    Allergies (verified) Patient has no known allergies.   History: Past Medical History:  Diagnosis Date   Arthritis    Osteoporosis  Sleep apnea    Past Surgical History:  Procedure Laterality Date   APPLICATION OF WOUND VAC Left 06/04/2022   Procedure: APPLICATION OF WOUND VAC;  Surgeon: Thornell Flirt, DO;  Location: [facility];  Service: Plastics;  Laterality: Left;   APPLICATION OF WOUND VAC Left 06/12/2022   Procedure: APPLICATION OF WOUND VAC;  Surgeon: Thornell Flirt, DO;  Location: [facility];  Service: Plastics;  Laterality: Left;   APPLICATION OF WOUND VAC Left 07/28/2022   Procedure: APPLICATION OF  WOUND VAC;  Surgeon: Thornell Flirt, DO;  Location: [facility];  Service: Plastics;  Laterality: Left;   AUGMENTATION MAMMAPLASTY     DEBRIDEMENT AND CLOSURE WOUND Left 05/26/2022   Procedure: ARM DEBRIDEMENT WITH MYRIAD PLACEMENT AND CHANGE WOUND VAC;  Surgeon: Thornell Flirt, DO;  Location: [facility];  Service: Plastics;  Laterality: Left;   DEBRIDEMENT AND CLOSURE WOUND Left 06/04/2022   Procedure: ARM DEBRIDEMENT WITH MYRIAD PLACEMENT, wound vac change;  Surgeon: Thornell Flirt, DO;  Location: [facility];  Service: Plastics;  Laterality: Left;   DEBRIDEMENT AND CLOSURE WOUND Left 07/28/2022   Procedure: Left arm wound excision with myriad and skin graft placement with VAC;  Surgeon: Thornell Flirt, DO;  Location: [facility];  Service: Plastics;  Laterality: Left;   hip replacements Bilateral    INCISION AND DRAINAGE OF WOUND Left 05/20/2022   Procedure: IRRIGATION AND DEBRIDEMENT AND WOUND VAC PLACEMENT LEFT ARM;  Surgeon: Gillermo Lack, MD;  Location: [facility];  Service: Orthopedics;  Laterality: Left;   MINOR IRRIGATION AND DEBRIDEMENT OF WOUND Right 05/20/2022   Procedure: IRRIGATION AND DEBRIDEMENT OF WOUND RIGHT ARM;  Surgeon: Gillermo Lack, MD;  Location: [facility];  Service: Orthopedics;  Laterality: Right;   SKIN SPLIT GRAFT Left 07/28/2022   Procedure: SKIN GRAFT SPLIT THICKNESS;  Surgeon: Thornell Flirt, DO;  Location: [facility];  Service: Plastics;  Laterality: Left;   Family History  Problem Relation Age of Onset   Breast cancer Neg Hx    Social History   Socioeconomic History   Marital status: Married    Spouse name: Braxton Calico   Number of children: Not on file   Years of education: Not on file   Highest education level: 7th grade  Occupational History   Not on file  Tobacco Use   Smoking status: Former    Current packs/day: 0.00    Average packs/day: 0.5 packs/day for 35.0  years (17.5 ttl pk-yrs)    Types: Cigarettes    Start date: 04/03/1975    Quit date: 04/03/2010    Years since quitting: 13.1   Smokeless tobacco: Never  Vaping Use   Vaping status: Never Used  Substance and Sexual Activity   Alcohol use: Yes    Alcohol/week: 6.0 standard drinks of alcohol    Types: 6 Cans of beer per week    Comment: 6 beers in a week   Drug use: Never   Sexual activity: Yes    Birth control/protection: None  Other Topics Concern   Not on file  Social History Narrative   Lives with husband   Social Drivers of Health   Financial Resource Strain: Medium Risk (05/18/2023)   Overall Financial Resource Strain (CARDIA)    Difficulty of Paying Living Expenses: Somewhat hard  Food Insecurity: No Food Insecurity (05/18/2023)   Hunger Vital Sign    Worried About Running Out of  Food in the Last Year: Never true    Ran Out of Food in the Last Year: Never true  Transportation Needs: No Transportation Needs (05/18/2023)   PRAPARE - Administrator, Civil Service (Medical): No    Lack of Transportation (Non-Medical): No  Physical Activity: Insufficiently Active (05/18/2023)   Exercise Vital Sign    Days of Exercise per Week: 4 days    Minutes of Exercise per Session: 30 min  Stress: No Stress Concern Present (05/18/2023)   [employer] of Occupational Health - Occupational Stress Questionnaire    Feeling of Stress : Not at all  Social Connections: Moderately Isolated (05/18/2023)   Social Connection and Isolation Panel [NHANES]    Frequency of Communication with Friends and Family: More than three times a week    Frequency of Social Gatherings with Friends and Family: More than three times a week    Attends Religious Services: Never    Database administrator or Organizations: No    Attends Engineer, structural: Never    Marital Status: Married    Tobacco Counseling Counseling given: Not Answered    Clinical Intake:  Pre-visit preparation  completed: Yes  Pain : No/denies pain     BMI - recorded: 29.66 Nutritional Status: BMI 25 -29 Overweight Nutritional Risks: None Diabetes: No  No results found for: "HGBA1C"   How often do you need to have someone help you when you read instructions, pamphlets, or other written materials from your doctor or pharmacy?: 2 - Rarely  Interpreter Needed?: No  Information entered by :: Scotlyn Mccranie, RMA   Activities of Daily Living     05/18/2023    3:03 PM 07/28/2022   10:16 AM  In your present state of health, do you have any difficulty performing the following activities:  Hearing? 0 0  Vision? 0 0  Difficulty concentrating or making decisions? 0 0  Walking or climbing stairs? 0 0  Dressing or bathing? 0 0  Doing errands, shopping? 0   Preparing Food and eating ? N   Using the Toilet? N   In the past six months, have you accidently leaked urine? Y   Do you have problems with loss of bowel control? N   Managing your Medications? N   Managing your Finances? N   Housekeeping or managing your Housekeeping? N     Patient Care Team: Elvira Hammersmith, MD as PCP - General (Internal Medicine)  Indicate any recent Medical Services you may have received from other than [facility] providers in the past year (date may be approximate).     Assessment:   This is a routine wellness examination for Anilah.  Hearing/Vision screen Hearing Screening - Comments:: Denies hearing difficulties     Goals Addressed   None    Depression Screen     05/18/2023    3:12 PM 03/18/2023   10:39 AM  PHQ 2/9 Scores  PHQ - 2 Score 3 0  PHQ- 9 Score 7     Fall Risk     05/18/2023    3:09 PM 03/18/2023   10:39 AM  Fall Risk   Falls in the past year? 0 0  Number falls in past yr: 0 0  Injury with Fall? 0 0  Risk for fall due to : No Fall Risks No Fall Risks  Follow up Falls prevention discussed;Falls evaluation completed Falls evaluation completed    MEDICARE RISK AT HOME:  Medicare Risk  at  Home Any stairs in or around the home?: No Home free of loose throw rugs in walkways, pet beds, electrical cords, etc?: Yes Adequate lighting in your home to reduce risk of falls?: Yes Life alert?: No Use of a cane, walker or w/c?: No Grab bars in the bathroom?: No Shower chair or bench in shower?: No Elevated toilet seat or a handicapped toilet?: No  TIMED UP AND GO:  Was the test performed?  No  Cognitive Function: Declined/Normal: No cognitive concerns noted by patient or family. Patient alert, oriented, able to answer questions appropriately and recall recent events. No signs of memory loss or confusion.        Immunizations Immunization History  Administered Date(s) Administered   Dtap, Unspecified 02/22/1999   Fluzone Influenza virus vaccine,trivalent (IIV3), split virus 11/22/2013, 10/27/2016   Influenza Whole 10/27/2016   Influenza, High Dose Seasonal PF 01/22/2023   Meningococcal Acwy, Unspecified 08/29/1999   PFIZER(Purple Top)SARS-COV-2 Vaccination 03/27/2019, 04/18/2019, 01/23/2020   PNEUMOCOCCAL CONJUGATE-20 10/20/2022   Pfizer Covid-19 Vaccine Bivalent Booster 26yrs & up 02/20/2021   Pfizer(Comirnaty)Fall Seasonal Vaccine 12 years and older 10/20/2022   Tdap 05/20/2022   Zoster Recombinant(Shingrix) 10/20/2022    Screening Tests Health Maintenance  Topic Date Due   Hepatitis C Screening  Never done   Colonoscopy  Never done   MAMMOGRAM  09/24/2019   DEXA SCAN  Never done   Medicare Annual Wellness (AWV)  08/27/2022   Zoster Vaccines- Shingrix (2 of 2) 12/15/2022   COVID-19 Vaccine (6 - 2024-25 season) 04/20/2023   INFLUENZA VACCINE  08/14/2023   DTaP/Tdap/Td (3 - Td or Tdap) 05/19/2032   Pneumonia Vaccine 15+ Years old  Completed   HPV VACCINES  Aged Out   Meningococcal B Vaccine  Aged Out    Health Maintenance  Health Maintenance Due  Topic Date Due   Hepatitis C Screening  Never done   Colonoscopy  Never done   MAMMOGRAM  09/24/2019   DEXA  SCAN  Never done   Medicare Annual Wellness (AWV)  08/27/2022   Zoster Vaccines- Shingrix (2 of 2) 12/15/2022   COVID-19 Vaccine (6 - 2024-25 season) 04/20/2023   Health Maintenance Items Addressed: Referral sent to GI for colonoscopy, Hepatitis C Screening, See Nurse Notes  Additional Screening:  Vision Screening: Recommended annual ophthalmology exams for early detection of glaucoma and other disorders of the eye.  Dental Screening: Recommended annual dental exams for proper oral hygiene  Community Resource Referral / Chronic Care Management: CRR required this visit?  No   CCM required this visit?  No     Plan:     I have personally reviewed and noted the following in the patient's chart:   Medical and social history Use of alcohol, tobacco or illicit drugs  Current medications and supplements including opioid prescriptions. Patient is not currently taking opioid prescriptions. Functional ability and status Nutritional status Physical activity Advanced directives List of other physicians Hospitalizations, surgeries, and ER visits in previous 12 months Vitals Screenings to include cognitive, depression, and falls Referrals and appointments  In addition, I have reviewed and discussed with patient certain preventive protocols, quality metrics, and best practice recommendations. A written personalized care plan for preventive services as well as general preventive health recommendations were provided to patient.     Park Beck L Danessa Mensch, CMA   05/18/2023   After Visit Summary: (MyChart) Due to this being a telephonic visit, the after visit summary with patients personalized plan was offered to patient via MyChart  Notes: Please refer to Routing Comments.

## 2023-05-19 ENCOUNTER — Encounter: Payer: Self-pay | Admitting: Radiology

## 2023-05-19 ENCOUNTER — Other Ambulatory Visit: Payer: Self-pay | Admitting: Radiology

## 2023-05-19 DIAGNOSIS — S548X2D Unspecified injury of other nerves at forearm level, left arm, subsequent encounter: Secondary | ICD-10-CM | POA: Diagnosis not present

## 2023-05-19 DIAGNOSIS — S4422XD Injury of radial nerve at upper arm level, left arm, subsequent encounter: Secondary | ICD-10-CM | POA: Diagnosis not present

## 2023-05-19 DIAGNOSIS — M810 Age-related osteoporosis without current pathological fracture: Secondary | ICD-10-CM

## 2023-05-21 ENCOUNTER — Telehealth: Payer: Self-pay | Admitting: Emergency Medicine

## 2023-05-21 ENCOUNTER — Encounter: Payer: Self-pay | Admitting: Emergency Medicine

## 2023-05-21 ENCOUNTER — Ambulatory Visit (HOSPITAL_COMMUNITY): Admitting: Psychiatry

## 2023-05-21 ENCOUNTER — Other Ambulatory Visit: Payer: Self-pay

## 2023-05-21 ENCOUNTER — Ambulatory Visit (INDEPENDENT_AMBULATORY_CARE_PROVIDER_SITE_OTHER): Admitting: Emergency Medicine

## 2023-05-21 ENCOUNTER — Encounter (HOSPITAL_COMMUNITY): Payer: Self-pay | Admitting: Psychiatry

## 2023-05-21 ENCOUNTER — Other Ambulatory Visit: Payer: Self-pay | Admitting: Emergency Medicine

## 2023-05-21 VITALS — BP 128/84 | HR 65 | Temp 98.5°F | Ht 61.0 in | Wt 155.0 lb

## 2023-05-21 VITALS — BP 130/81 | HR 76 | Ht 61.0 in | Wt 158.0 lb

## 2023-05-21 DIAGNOSIS — E785 Hyperlipidemia, unspecified: Secondary | ICD-10-CM

## 2023-05-21 DIAGNOSIS — Z13220 Encounter for screening for lipoid disorders: Secondary | ICD-10-CM | POA: Diagnosis not present

## 2023-05-21 DIAGNOSIS — M810 Age-related osteoporosis without current pathological fracture: Secondary | ICD-10-CM | POA: Diagnosis not present

## 2023-05-21 DIAGNOSIS — Z0000 Encounter for general adult medical examination without abnormal findings: Secondary | ICD-10-CM | POA: Diagnosis not present

## 2023-05-21 DIAGNOSIS — Z1231 Encounter for screening mammogram for malignant neoplasm of breast: Secondary | ICD-10-CM

## 2023-05-21 DIAGNOSIS — R32 Unspecified urinary incontinence: Secondary | ICD-10-CM

## 2023-05-21 DIAGNOSIS — Z13228 Encounter for screening for other metabolic disorders: Secondary | ICD-10-CM

## 2023-05-21 DIAGNOSIS — Z1329 Encounter for screening for other suspected endocrine disorder: Secondary | ICD-10-CM

## 2023-05-21 DIAGNOSIS — F5101 Primary insomnia: Secondary | ICD-10-CM | POA: Diagnosis not present

## 2023-05-21 DIAGNOSIS — R109 Unspecified abdominal pain: Secondary | ICD-10-CM | POA: Insufficient documentation

## 2023-05-21 DIAGNOSIS — I700 Atherosclerosis of aorta: Secondary | ICD-10-CM

## 2023-05-21 DIAGNOSIS — G4733 Obstructive sleep apnea (adult) (pediatric): Secondary | ICD-10-CM

## 2023-05-21 DIAGNOSIS — Z130 Encounter for screening for diseases of the blood and blood-forming organs and certain disorders involving the immune mechanism: Secondary | ICD-10-CM | POA: Diagnosis not present

## 2023-05-21 DIAGNOSIS — Z0001 Encounter for general adult medical examination with abnormal findings: Secondary | ICD-10-CM

## 2023-05-21 DIAGNOSIS — Z1211 Encounter for screening for malignant neoplasm of colon: Secondary | ICD-10-CM

## 2023-05-21 DIAGNOSIS — E559 Vitamin D deficiency, unspecified: Secondary | ICD-10-CM

## 2023-05-21 LAB — CBC WITH DIFFERENTIAL/PLATELET
Basophils Absolute: 0 10*3/uL (ref 0.0–0.1)
Basophils Relative: 0.7 % (ref 0.0–3.0)
Eosinophils Absolute: 0.1 10*3/uL (ref 0.0–0.7)
Eosinophils Relative: 1.9 % (ref 0.0–5.0)
HCT: 42.5 % (ref 36.0–46.0)
Hemoglobin: 13.9 g/dL (ref 12.0–15.0)
Lymphocytes Relative: 37.3 % (ref 12.0–46.0)
Lymphs Abs: 2.2 10*3/uL (ref 0.7–4.0)
MCHC: 32.6 g/dL (ref 30.0–36.0)
MCV: 93.9 fl (ref 78.0–100.0)
Monocytes Absolute: 0.4 10*3/uL (ref 0.1–1.0)
Monocytes Relative: 7.1 % (ref 3.0–12.0)
Neutro Abs: 3.1 10*3/uL (ref 1.4–7.7)
Neutrophils Relative %: 53 % (ref 43.0–77.0)
Platelets: 338 10*3/uL (ref 150.0–400.0)
RBC: 4.53 Mil/uL (ref 3.87–5.11)
RDW: 14.1 % (ref 11.5–15.5)
WBC: 5.8 10*3/uL (ref 4.0–10.5)

## 2023-05-21 LAB — COMPREHENSIVE METABOLIC PANEL WITH GFR
ALT: 14 U/L (ref 0–35)
AST: 20 U/L (ref 0–37)
Albumin: 4.1 g/dL (ref 3.5–5.2)
Alkaline Phosphatase: 96 U/L (ref 39–117)
BUN: 13 mg/dL (ref 6–23)
CO2: 28 meq/L (ref 19–32)
Calcium: 8.8 mg/dL (ref 8.4–10.5)
Chloride: 103 meq/L (ref 96–112)
Creatinine, Ser: 0.69 mg/dL (ref 0.40–1.20)
GFR: 90.04 mL/min (ref >60.00)
Glucose, Bld: 89 mg/dL (ref 70–99)
Potassium: 4.3 meq/L (ref 3.5–5.1)
Sodium: 137 meq/L (ref 135–145)
Total Bilirubin: 0.5 mg/dL (ref 0.2–1.2)
Total Protein: 7.1 g/dL (ref 6.0–8.3)

## 2023-05-21 LAB — LIPID PANEL
Cholesterol: 242 mg/dL — ABNORMAL HIGH (ref 0–200)
HDL: 68.6 mg/dL (ref >39.00)
LDL Cholesterol: 126 mg/dL — ABNORMAL HIGH (ref 0–99)
NonHDL: 173.64
Total CHOL/HDL Ratio: 4
Triglycerides: 237 mg/dL — ABNORMAL HIGH (ref 0.0–149.0)
VLDL: 47.4 mg/dL — ABNORMAL HIGH (ref 0.0–40.0)

## 2023-05-21 LAB — VITAMIN B12: Vitamin B-12: 457 pg/mL (ref 211–911)

## 2023-05-21 LAB — TSH: TSH: 1.27 u[IU]/mL (ref 0.35–5.50)

## 2023-05-21 LAB — HEMOGLOBIN A1C: Hgb A1c MFr Bld: 6.1 % (ref 4.6–6.5)

## 2023-05-21 LAB — VITAMIN D 25 HYDROXY (VIT D DEFICIENCY, FRACTURES): VITD: 16.24 ng/mL — ABNORMAL LOW (ref 30.00–100.00)

## 2023-05-21 MED ORDER — DOXEPIN HCL 25 MG PO CAPS: 25.0000 mg | ORAL_CAPSULE | Freq: Every day | ORAL | 0 refills | Status: DC

## 2023-05-21 MED ORDER — VITAMIN D (ERGOCALCIFEROL) 1.25 MG (50000 UNIT) PO CAPS
50000.0000 [IU] | ORAL_CAPSULE | ORAL | 1 refills | Status: DC
Start: 2023-05-21 — End: 2023-08-01

## 2023-05-21 MED ORDER — ESTROGENS CONJUGATED 0.625 MG/GM VA CREA: TOPICAL_CREAM | Freq: Every day | VAGINAL | 2 refills | Status: DC

## 2023-05-21 MED ORDER — ROSUVASTATIN CALCIUM 10 MG PO TABS: 10.0000 mg | ORAL_TABLET | Freq: Every day | ORAL | 3 refills | Status: AC

## 2023-05-21 NOTE — Progress Notes (Signed)
 Psychiatric Initial Adult Assessment   Patient location; office Provider location; office  Patient Identification: April Bowman MRN:  [id number] Date of Evaluation:  05/21/2023 Referral Source: PCP Chief Complaint:   Chief Complaint  Patient presents with   Establish Care   Visit Diagnosis:    ICD-10-CM   1. Primary insomnia  F51.01 doxepin (SINEQUAN) 25 MG capsule      History of Present Illness: Patient is 68 year old married female who was referred from primary care for insomnia.  Patient denies any depressive symptoms, anxiety symptoms, PTSD symptoms, mania, psychosis, hallucination.  She reported her biggest concern is not able to sleep and then she get tired.  Patient told he was seeing primary care in the past he was prescribing Seroquel  but now her new primary care wants to see psychiatrist.  When I ask about why Seroquel  patient replied that she is not sure because it was given by primary care.  As per EMR she had tried in the past mirtazapine, nortriptyline and trazodone .  Despite taking antianxiety medicine and antipsychotic medication patient denied having the symptoms.  She reported her insomnia is for many years and she uses CPAP regularly but that does not help.  She reported Seroquel  helps and able to sleep at least 6 7 hours.  She denies any feeling of hopelessness, anhedonia, worthlessness, active or passive suicidal thoughts or homicidal thoughts.  She works part-time 6 hours as a housekeeping.  She lives with her husband who she has been married for 9 years.  Patient told her 78 year old grandson also lives with them.  Patient told his first born child was killed 11 years ago in a robbery and it was very difficult for her after the death of the child.  She reported having crying spells not able to cope well but having a good social network through the chart had helped her a lot.  Patient denies any nightmares flashback.  She is not seeing any therapist.  She does not feel she  need to see any therapist.  Her appetite is okay.  Her weight is stable.  [place].  She is from [place].  Her younger son lives in [place].  Her daughter lives in [place].  She has 5 grandkids.  Patient told father of the kids disease many years ago and she remarried 9 years ago.  Patient denies drinking or using any illegal substances.  She is prescribed CPAP but did not have any recent visit to sleep doctor.  She takes gabapentin  for her neuropathy.  Past Psychiatric History: None reported.  However prescribed nortriptyline, mirtazapine, trazodone , hydroxyzine , Xanax and Seroquel  from her primary care.  Previous Psychotropic Medications: Yes   Substance Abuse History in the last 12 months:  No.  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  Past Medical History:  Past Medical History:  Diagnosis Date   Arthritis    Osteoporosis    Sleep apnea     Past Surgical History:  Procedure Laterality Date   APPLICATION OF WOUND VAC Left 06/04/2022   Procedure: APPLICATION OF WOUND VAC;  Surgeon: Thornell Flirt, DO;  Location: [facility];  Service: Plastics;  Laterality: Left;   APPLICATION OF WOUND VAC Left 06/12/2022   Procedure: APPLICATION OF WOUND VAC;  Surgeon: Thornell Flirt, DO;  Location: [facility];  Service: Plastics;  Laterality: Left;   APPLICATION OF WOUND VAC  Left 07/28/2022   Procedure: APPLICATION OF WOUND VAC;  Surgeon: Thornell Flirt, DO;  Location: [facility];  Service: Plastics;  Laterality: Left;   AUGMENTATION MAMMAPLASTY     DEBRIDEMENT AND CLOSURE WOUND Left 05/26/2022   Procedure: ARM DEBRIDEMENT WITH MYRIAD PLACEMENT AND CHANGE WOUND VAC;  Surgeon: Thornell Flirt, DO;  Location: [facility];  Service: Plastics;  Laterality: Left;   DEBRIDEMENT AND CLOSURE WOUND Left 06/04/2022   Procedure: ARM DEBRIDEMENT WITH  MYRIAD PLACEMENT, wound vac change;  Surgeon: Thornell Flirt, DO;  Location: [facility];  Service: Plastics;  Laterality: Left;   DEBRIDEMENT AND CLOSURE WOUND Left 07/28/2022   Procedure: Left arm wound excision with myriad and skin graft placement with VAC;  Surgeon: Thornell Flirt, DO;  Location: [facility];  Service: Plastics;  Laterality: Left;   hip replacements Bilateral    INCISION AND DRAINAGE OF WOUND Left 05/20/2022   Procedure: IRRIGATION AND DEBRIDEMENT AND WOUND VAC PLACEMENT LEFT ARM;  Surgeon: Gillermo Lack, MD;  Location: [facility];  Service: Orthopedics;  Laterality: Left;   MINOR IRRIGATION AND DEBRIDEMENT OF WOUND Right 05/20/2022   Procedure: IRRIGATION AND DEBRIDEMENT OF WOUND RIGHT ARM;  Surgeon: Gillermo Lack, MD;  Location: [facility];  Service: Orthopedics;  Laterality: Right;   SKIN SPLIT GRAFT Left 07/28/2022   Procedure: SKIN GRAFT SPLIT THICKNESS;  Surgeon: Thornell Flirt, DO;  Location: [facility];  Service: Plastics;  Laterality: Left;    Family Psychiatric History: Denies  Family History:  Family History  Problem Relation Age of Onset   Breast cancer Neg Hx     Social History:   Social History   Socioeconomic History   Marital status: Married    Spouse name: Braxton Calico   Number of children: Not on file   Years of education: Not on file   Highest education level: 7th grade  Occupational History   Not on file  Tobacco Use   Smoking status: Former    Current packs/day: 0.00    Average packs/day: 0.5 packs/day for 35.0 years (17.5 ttl pk-yrs)    Types: Cigarettes    Start date: 04/03/1975    Quit date: 04/03/2010    Years since quitting: 13.1   Smokeless tobacco: Never  Vaping Use   Vaping status: Never Used  Substance and Sexual Activity   Alcohol use: Not Currently   Drug use: Never   Sexual activity: Yes    Birth control/protection: None  Other Topics Concern   Not on  file  Social History Narrative   Lives with husband   Social Drivers of Health   Financial Resource Strain: Medium Risk (05/18/2023)   Overall Financial Resource Strain (CARDIA)    Difficulty of Paying Living Expenses: Somewhat hard  Food Insecurity: No Food Insecurity (05/18/2023)   Hunger Vital Sign    Worried About Running Out of Food in the Last Year: Never true    Ran Out of Food in the Last Year: Never true  Transportation Needs: No Transportation Needs (05/18/2023)   PRAPARE - Administrator, Civil Service (Medical): No    Lack of Transportation (Non-Medical): No  Physical Activity: Insufficiently Active (05/18/2023)   Exercise Vital Sign    Days of Exercise per Week: 4 days    Minutes of Exercise per Session: 30 min  Stress: No Stress Concern Present (05/18/2023)   [employer] of Occupational Health - Occupational Stress Questionnaire  Feeling of Stress : Not at all  Social Connections: Moderately Isolated (05/18/2023)   Social Connection and Isolation Panel [NHANES]    Frequency of Communication with Friends and Family: More than three times a week    Frequency of Social Gatherings with Friends and Family: More than three times a week    Attends Religious Services: Never    Database administrator or Organizations: No    Attends Banker Meetings: Never    Marital Status: Married    Additional Social History: Patient born and lived in [place] 80.  She is working as a Stage manager.  Patient oldest child was killed in a robbery 12 years ago.  She has a younger son who lives in [place] and a daughter who lives in [place].  She has 5 grandkids.  Patient lives with her husband who she has been married 9 years ago.  Allergies:  No Known Allergies  Metabolic Disorder Labs: No results found for: "HGBA1C", "MPG" No results found for: "PROLACTIN" No results found for: "CHOL", "TRIG", "HDL", "CHOLHDL", "VLDL", "LDLCALC" No results  found for: "TSH"  Therapeutic Level Labs: No results found for: "LITHIUM" No results found for: "CBMZ" No results found for: "VALPROATE"  Current Medications: Current Outpatient Medications  Medication Sig Dispense Refill   acetaminophen  (TYLENOL ) 500 MG tablet Take 2 tablets (1,000 mg total) by mouth every 6 (six) hours as needed for mild pain or fever.     ASCORBIC ACID PO Take 1 tablet by mouth daily. Vitamin C, unknown strength.     Cholecalciferol  (VITAMIN D-3 PO) Take 1 capsule by mouth daily.     conjugated estrogens (PREMARIN) vaginal cream Place vaginally.     Cyanocobalamin (VITAMIN B-12 PO) Take 1 tablet by mouth daily.     estradiol (ESTRACE VAGINAL) 0.1 MG/GM vaginal cream Place 1 g vaginally at bedtime.     gabapentin  (NEURONTIN ) 300 MG capsule Take 1 capsule (300 mg total) by mouth 3 (three) times daily. 90 capsule 0   MAGNESIUM  OXIDE PO Take 1 tablet by mouth daily.     mirabegron ER (MYRBETRIQ) 50 MG TB24 tablet Take 1 tablet by mouth daily.     MORINGA OLEIFERA PO Take 1 capsule by mouth daily.     Omega-3 Fatty Acids (OMEGA 3 500 PO) Take 500 mg by mouth daily at 2 am.     OVER THE COUNTER MEDICATION Take 1 tablet by mouth daily. Vitamin A + Vitamin D3 combination supplement.     POTASSIUM GLUCONATE PO Take 1 tablet by mouth daily.     QUEtiapine  (SEROQUEL ) 100 MG tablet Take 100 mg by mouth at bedtime.     VITAMIN E PO Take 1 capsule by mouth daily.     diphenhydrAMINE  (BENADRYL ) 25 MG tablet Take 1 tablet (25 mg total) by mouth every 8 (eight) hours as needed for up to 5 days. 15 tablet 0   No current facility-administered medications for this visit.    Musculoskeletal: Strength & Muscle Tone: within normal limits Gait & Station: normal Patient leans: N/A  Psychiatric Specialty Exam: Review of Systems  Constitutional:  Positive for fatigue.  Neurological:  Positive for numbness.  Psychiatric/Behavioral:  Positive for sleep disturbance.     Blood pressure  130/81, pulse 76, height 5\' 1"  (1.549 m), weight 158 lb (71.7 kg).Body mass index is 29.85 kg/m.  General Appearance: Casual  Eye Contact:  Good  Speech:  Slow  Volume:  Normal  Mood:  Euthymic and tired  Affect:  Appropriate  Thought Process:  Goal Directed  Orientation:  Full (Time, Place, and Person)  Thought Content:  Logical  Suicidal Thoughts:  No  Homicidal Thoughts:  No  Memory:  Immediate;   Fair Recent;   Fair Remote;   Fair  Judgement:  Intact  Insight:  Present  Psychomotor Activity:  Normal  Concentration:  Concentration: Fair and Attention Span: Fair  Recall:  [employer] of Knowledge:Fair  Language: Fair  Akathisia:  No  Handed:  Right  AIMS (if indicated):  not done  Assets:  Communication Skills Desire for Improvement Housing Social Support Talents/Skills Transportation  ADL's:  Intact  Cognition: WNL  Sleep:  Fair   Screenings: CAGE-AID    Flowsheet Row ED to Hosp-Admission (Discharged) from 05/20/2022 in [facility]  CAGE-AID Score 0      PHQ2-9    Flowsheet Row Clinical Support from 05/18/2023 in [facility] [place] Office Visit from 03/18/2023 in [facility] [place]  PHQ-2 Total Score 3 0  PHQ-9 Total Score 7 --      Flowsheet Row ED from 09/03/2022 in [facility] at [facility] Admission (Discharged) from 07/28/2022 in [facility] Admission (Discharged) from 06/12/2022 in [facility]  C-SSRS RISK CATEGORY No Risk No Risk No Risk       Assessment and Plan: Patient is 68 year old female with history of osteoarthritis, degenerative disc disease, neuropathy, sleep apnea, aortic arthrosclerosis and chronic insomnia.  She was referred from primary care for chronic insomnia.  I reviewed blood work results, current medication.  She is on gabapentin  for neuropathy.  In the past she has taken psychotropic medication from primary care and  currently she is on Seroquel  100 mg at bedtime.  I explained the Seroquel  is given for psychosis or mood symptoms and patient does not want Seroquel  because she does not have these diagnoses.  She is score 0 on anxiety scale and other screening test.  She does not feel depressed or anxious.  She like to try a different medication that she can help sleep.  I discussed Ambien however given the dependency issue she wanted to try a different medication.  I explained about doxepin that can be used for chronic insomnia.  She agreed to give a try.  She like to try a higher dose however I recommend to try 25 -50 mg at bedtime due to sedation.  Recommend not to drive after taking the medication.  She will not take the Seroquel .  I recommend to call us  back if she has any question, worsening of symptoms.  I spent 20 minutes explaining about the psychotropic medication, indication and potential benefits and side effects.  I also explained that she should consider getting revisit to sleep physician if CPAP needs to be adjusted.  I will forward my note to her PCP.  Will follow-up in 3 weeks.   Collaboration of Care: Other provider involved in patient's care AEB notes are available in epic to review  Patient/Guardian was advised Release of Information must be obtained prior to any record release in order to collaborate their care with an outside provider. Patient/Guardian was advised if they have not already done so to contact the registration department to sign all necessary forms in order for us  to release information regarding their care.   Consent: Patient/Guardian gives verbal consent for treatment and assignment of benefits for  services provided during this visit. Patient/Guardian expressed understanding and agreed to proceed.   Arturo Late, MD 5/8/20259:14 AM

## 2023-05-21 NOTE — Assessment & Plan Note (Signed)
 Well-documented diagnosis in the past On CPAP treatment but finds it very uncomfortable Wants to try new approach like Inspire Has appointment with pulmonary sleep clinic June 5

## 2023-05-21 NOTE — Assessment & Plan Note (Signed)
 States she was evaluated by urologist for this and prescribed Myrbetriq 50 mg daily. Records not available for my review.

## 2023-05-21 NOTE — Assessment & Plan Note (Signed)
 Intermittent symptoms over the past several months Clinically stable.  No red flag signs or symptoms. Benign abdominal examination. Requesting stool examination for parasites Concerned about fatty liver.

## 2023-05-21 NOTE — Assessment & Plan Note (Signed)
 Chronic and psychophysiological in nature. Has tried different over-the-counter and prescription medications with marginal results.  Melatonin discussed. Mental health discussed.  Denies depression or anxiety. Was able to follow-up with psychiatrist today Started on doxepin. Seroquel  discontinued Gabapentin  discontinued

## 2023-05-21 NOTE — Assessment & Plan Note (Signed)
 Diet and nutrition discussed Not taking statin medication at present time Lipid profile done today

## 2023-05-21 NOTE — Telephone Encounter (Signed)
 Copied from CRM [id number]. Topic: Clinical - Prescription Issue >> May 21, 2023  3:50 PM Deaijah H wrote: Reason for CRM: Patient called in stating the Pharmacy needs verification from Dr. Malone Sear before giving patient the correct sleeping pills (doxepin (SINEQUAN) 25 MG capsule). Please call [phone]  if needed.

## 2023-05-21 NOTE — Telephone Encounter (Signed)
 This was ordered today by her psychiatrist.  Recommend to call his office.Thanks.

## 2023-05-21 NOTE — Patient Instructions (Signed)
 Mantenimiento de Radiographer, therapeutic en las mujeres Health Maintenance, Female Adoptar un estilo de vida saludable y recibir atencin preventiva son importantes para promover la salud y Counsellor. Consulte al mdico sobre: El esquema adecuado para hacerse pruebas y exmenes peridicos. Cosas que puede hacer por su cuenta para prevenir enfermedades y [place] sano. Qu debo saber sobre la dieta, el peso y el ejercicio? Consuma una dieta saludable  Consuma una dieta que incluya muchas verduras, frutas, productos lcteos con bajo contenido de [place] y Associate Professor. No consuma muchos alimentos ricos en grasas slidas, azcares agregados o sodio. Mantenga un peso saludable El ndice de masa muscular [facility]) se [place] para identificar problemas de [place]. Proporciona una estimacin de la grasa corporal basndose en el peso y la altura. Su mdico puede ayudarle a Engineer, site IMC y a Personnel officer o Pharmacologist un peso saludable. Haga ejercicio con regularidad Haga ejercicio con regularidad. Esta es una de las prcticas ms importantes que puede hacer por su salud. La [employer] de los adultos deben seguir estas pautas: Education officer, environmental, al menos, 150 minutos de actividad fsica por semana. El ejercicio debe aumentar la frecuencia cardaca y Media planner transpirar (ejercicio de intensidad moderada). Hacer ejercicios de fortalecimiento por lo [employer] por semana. Agregue esto a su plan de ejercicio de intensidad moderada. Pase menos tiempo sentada. Incluso la actividad fsica ligera puede ser beneficiosa. Controle sus niveles de colesterol y lpidos en la sangre Comience a realizarse anlisis de lpidos y Oncologist en la sangre a los 20 aos y luego reptalos cada 5 aos. Hgase controlar los niveles de colesterol con mayor frecuencia si: Sus niveles de lpidos y colesterol son altos. Es mayor de 40 aos. Presenta un alto riesgo de padecer enfermedades cardacas. Qu debo saber sobre las pruebas de deteccin del  cncer? Segn su historia clnica y sus antecedentes familiares, es posible que deba realizarse pruebas de deteccin del cncer en diferentes edades. Esto puede incluir pruebas de deteccin de lo siguiente: Cncer de mama. Cncer de cuello uterino. Cncer colorrectal. Cncer de piel. Cncer de pulmn. Qu debo saber sobre la enfermedad cardaca, la diabetes y la hipertensin arterial? Presin arterial y enfermedad cardaca La hipertensin arterial causa enfermedades cardacas y [place] el riesgo de accidente cerebrovascular. Es ms probable que esto se manifieste en las personas que tienen lecturas de presin arterial alta o tienen sobrepeso. Hgase controlar la presin arterial: Cada 3 a 5 aos si tiene entre 18 y 50 aos. Todos los aos si es mayor de 40 aos. Diabetes Realcese exmenes de deteccin de la diabetes con regularidad. Este anlisis revisa el nivel de azcar en la sangre en [place]. Hgase las pruebas de deteccin: Cada tres aos despus de los 40 aos de edad si tiene un peso normal y un bajo riesgo de padecer diabetes. Con ms frecuencia y a partir de Jerome edad inferior si tiene sobrepeso o un alto riesgo de padecer diabetes. Qu debo saber sobre la prevencin de infecciones? Hepatitis B Si tiene un riesgo ms alto de contraer hepatitis B, debe someterse a un examen de deteccin de este virus. Hable con el mdico para averiguar si tiene riesgo de contraer la infeccin por hepatitis B. Hepatitis C Se recomienda el anlisis a: [employer] 1945 y 1965. Todas las personas que tengan un riesgo de haber contrado hepatitis C. Enfermedades de transmisin sexual (ETS) Hgase las pruebas de Airline pilot de ITS, incluidas la gonorrea y la clamidia, si: Es sexualmente activa y es Adult nurse de 24  aos. Es mayor de 24 aos, y el mdico le informa que corre riesgo de tener este tipo de infecciones. La actividad sexual ha cambiado desde que le hicieron la ltima prueba de  deteccin y tiene un riesgo mayor de Warehouse manager clamidia o Copy. Pregntele al mdico si usted tiene riesgo. Pregntele al mdico si usted tiene un alto riesgo de Primary school teacher VIH. El mdico tambin puede recomendarle un medicamento recetado para ayudar a evitar la infeccin por el VIH. Si elige tomar medicamentos para prevenir el VIH, primero debe [employer] de deteccin del VIH. Luego debe hacerse anlisis cada 3 meses mientras est tomando los medicamentos. Embarazo Si est por dejar de Armed forces training and education officer (fase premenopusica) y usted puede quedar [place], busque asesoramiento antes de [place]. Tome de 400 a 800 microgramos (mcg) de cido [employer] si [place]. Pida mtodos de control de la natalidad (anticonceptivos) si desea evitar un embarazo no deseado. Osteoporosis y [place] La osteoporosis es una enfermedad en la que los huesos pierden los minerales y la fuerza por el avance de la edad. El resultado pueden ser fracturas en los [place]. Si tiene 65 aos o ms, o si est en riesgo de sufrir osteoporosis y fracturas, pregunte a su mdico si debe: Hacerse pruebas de deteccin de prdida sea. Tomar un suplemento de calcio o de vitamina D para reducir el riesgo de fracturas. Recibir terapia de reemplazo hormonal (TRH) para tratar los sntomas de la menopausia. Siga estas indicaciones en su casa: Consumo de alcohol No beba alcohol si: Su mdico le indica no hacerlo. Est embarazada, puede estar embarazada o est tratando de [place]. Si bebe alcohol: Limite la cantidad que bebe a lo siguiente: De 0 a 1 bebida por da. Sepa cunta cantidad de alcohol hay en las bebidas que toma. En los [address], una medida equivale a una botella de cerveza de 12 oz (355 ml), un vaso de vino de 5 oz (148 ml) o un vaso de una bebida alcohlica de alta graduacin de 1 oz (44 ml). Estilo de vida No consuma ningn producto que contenga nicotina o tabaco. Estos  productos incluyen cigarrillos, tabaco para Theatre manager y aparatos de vapeo, como los Administrator, Civil Service. Si necesita ayuda para dejar de consumir estos productos, consulte al mdico. No consuma drogas. No comparta agujas. Solicite ayuda a su mdico si necesita apoyo o informacin para abandonar las drogas. Indicaciones generales Realcese los estudios de rutina de [address], dentales y de Wellsite geologist. Mantngase al da con las vacunas. Infrmele a su mdico si: Se siente deprimida con frecuencia. Alguna vez ha sido vctima de [place] o no se siente seguro en su casa. Resumen Adoptar un estilo de vida saludable y recibir atencin preventiva son importantes para promover la salud y Counsellor. Siga las instrucciones del mdico acerca de una dieta saludable, el ejercicio y la realizacin de pruebas o exmenes para Hotel manager. Siga las instrucciones del mdico con respecto al control del colesterol y la presin arterial. Esta informacin no tiene Theme park manager el consejo del mdico. Asegrese de hacerle al mdico cualquier pregunta que tenga. Document Revised: 06/07/2020 Document Reviewed: 06/07/2020 Elsevier Patient Education  2024 [employer].

## 2023-05-21 NOTE — Assessment & Plan Note (Signed)
 However no report available yet

## 2023-05-21 NOTE — Progress Notes (Signed)
 [name] 68 y.o.   Chief Complaint  Patient presents with   Annual Exam    Patient here for physical. Wanting her Vitamin D & E  levels checked, also wanting to check if she has a fatty liver. Also needing medication refills for naproxen , her vaginal cream, gabapentin , and Seroquel      HISTORY OF PRESENT ILLNESS: This is a 68 y.o. female here for annual physical Was able to follow-up with psychiatrist today Told to stop gabapentin  and Seroquel  Was started on doxepin for sleep management Has appointment June 5 with pulmonary for evaluation of sleep apnea treatments.  Interested in inspire. Wants to know if she has a fatty liver. Occasional abdominal discomfort.  Requesting stool exam for parasites. No other complaints or medical concerns today.  HPI   Prior to Admission medications   Medication Sig Start Date End Date Taking? Authorizing Provider  acetaminophen  (TYLENOL ) 500 MG tablet Take 2 tablets (1,000 mg total) by mouth every 6 (six) hours as needed for mild pain or fever. 05/27/22  Yes Berkeley Breath R, PA-C  ASCORBIC ACID PO Take 1 tablet by mouth daily. Vitamin C, unknown strength.   Yes [provider]  Cholecalciferol  (VITAMIN D-3 PO) Take 1 capsule by mouth daily.   Yes [provider]  conjugated estrogens (PREMARIN) vaginal cream Place vaginally. 01/03/22  Yes [provider]  Cyanocobalamin (VITAMIN B-12 PO) Take 1 tablet by mouth daily.   Yes [provider]  doxepin (SINEQUAN) 25 MG capsule Take 1-2 capsules (25-50 mg total) by mouth at bedtime. 05/21/23 06/20/23 Yes Arfeen, Bronson Canny, MD  estradiol (ESTRACE VAGINAL) 0.1 MG/GM vaginal cream Place 1 g vaginally at bedtime. 05/02/16  Yes [provider]  MAGNESIUM  OXIDE PO Take 1 tablet by mouth daily.   Yes [provider]  mirabegron ER (MYRBETRIQ) 50 MG TB24 tablet Take 1 tablet by mouth daily. 03/14/21  Yes [provider]  MORINGA OLEIFERA PO Take 1 capsule  by mouth daily.   Yes [provider]  Omega-3 Fatty Acids (OMEGA 3 500 PO) Take 500 mg by mouth daily at 2 am.   Yes [provider]  OVER THE COUNTER MEDICATION Take 1 tablet by mouth daily. Vitamin A + Vitamin D3 combination supplement.   Yes [provider]  POTASSIUM GLUCONATE PO Take 1 tablet by mouth daily.   Yes [provider]  VITAMIN E PO Take 1 capsule by mouth daily.   Yes [provider]  diphenhydrAMINE  (BENADRYL ) 25 MG tablet Take 1 tablet (25 mg total) by mouth every 8 (eight) hours as needed for up to 5 days. 09/03/22 09/08/22  Sonnie Dusky, PA-C    No Known Allergies  Patient Active Problem List   Diagnosis Date Noted   Aortic atherosclerosis (HCC) 03/18/2023   Urinary incontinence 03/18/2023   Age-related osteoporosis without current pathological fracture 02/18/2023   Injury of left radial nerve 11/06/2022   Posterior interosseous nerve injury, left, subsequent encounter 07/24/2022   Open fracture of distal end of ulna without additional fracture 06/06/2022   Dog bite of left arm 05/21/2022   Nontraumatic complete tear of left rotator cuff 05/13/2022   Acute bilateral low back pain without sciatica 04/30/2022   Rotator cuff tendinitis, left 04/14/2022   Shoulder tendinitis, right 02/17/2022   Biceps tendinitis, right 02/17/2022   Biceps tendinitis of left shoulder 02/17/2022   Acute pain of both shoulders 02/17/2022   Cervical cancer screening 08/26/2021   Bug bite 08/26/2021  Vitamin D deficiency 05/23/2021   Vaginal dryness 05/23/2021   Osteoarthritis 05/23/2021   Mixed stress and urge urinary incontinence 05/23/2021   Encounter for general adult medical examination without abnormal findings 05/23/2021   Insomnia 06/02/2017   OSA (obstructive sleep apnea) 06/02/2017   Lumbar spondylosis 03/24/2016   DDD (degenerative disc disease), lumbar 03/24/2016   Chronic right-sided low back pain with right-sided sciatica  01/23/2016   Status post right hip replacement 10/11/2015   Lipoma 11/01/2014   Carpal tunnel syndrome, bilateral 11/01/2014   S/P hip replacement 05/22/2011    Past Medical History:  Diagnosis Date   Arthritis    Osteoporosis    Sleep apnea     Past Surgical History:  Procedure Laterality Date   APPLICATION OF WOUND VAC Left 06/04/2022   Procedure: APPLICATION OF WOUND VAC;  Surgeon: Thornell Flirt, DO;  Location: [facility];  Service: Plastics;  Laterality: Left;   APPLICATION OF WOUND VAC Left 06/12/2022   Procedure: APPLICATION OF WOUND VAC;  Surgeon: Thornell Flirt, DO;  Location: [facility];  Service: Plastics;  Laterality: Left;   APPLICATION OF WOUND VAC Left 07/28/2022   Procedure: APPLICATION OF WOUND VAC;  Surgeon: Thornell Flirt, DO;  Location: [facility];  Service: Plastics;  Laterality: Left;   AUGMENTATION MAMMAPLASTY     DEBRIDEMENT AND CLOSURE WOUND Left 05/26/2022   Procedure: ARM DEBRIDEMENT WITH MYRIAD PLACEMENT AND CHANGE WOUND VAC;  Surgeon: Thornell Flirt, DO;  Location: [facility];  Service: Plastics;  Laterality: Left;   DEBRIDEMENT AND CLOSURE WOUND Left 06/04/2022   Procedure: ARM DEBRIDEMENT WITH MYRIAD PLACEMENT, wound vac change;  Surgeon: Thornell Flirt, DO;  Location: [facility];  Service: Plastics;  Laterality: Left;   DEBRIDEMENT AND CLOSURE WOUND Left 07/28/2022   Procedure: Left arm wound excision with myriad and skin graft placement with VAC;  Surgeon: Thornell Flirt, DO;  Location: [facility];  Service: Plastics;  Laterality: Left;   hip replacements Bilateral    INCISION AND DRAINAGE OF WOUND Left 05/20/2022   Procedure: IRRIGATION AND DEBRIDEMENT AND WOUND VAC PLACEMENT LEFT ARM;  Surgeon: Gillermo Lack, MD;  Location: [facility];  Service: Orthopedics;  Laterality: Left;   MINOR IRRIGATION AND DEBRIDEMENT OF WOUND Right 05/20/2022   Procedure:  IRRIGATION AND DEBRIDEMENT OF WOUND RIGHT ARM;  Surgeon: Gillermo Lack, MD;  Location: [facility];  Service: Orthopedics;  Laterality: Right;   SKIN SPLIT GRAFT Left 07/28/2022   Procedure: SKIN GRAFT SPLIT THICKNESS;  Surgeon: Thornell Flirt, DO;  Location: [facility];  Service: Plastics;  Laterality: Left;    Social History   Socioeconomic History   Marital status: Married    Spouse name: Braxton Calico   Number of children: Not on file   Years of education: Not on file   Highest education level: 7th grade  Occupational History   Not on file  Tobacco Use   Smoking status: Former    Current packs/day: 0.00    Average packs/day: 0.5 packs/day for 35.0 years (17.5 ttl pk-yrs)    Types: Cigarettes    Start date: 04/03/1975    Quit date: 04/03/2010    Years since quitting: 13.1   Smokeless tobacco: Never  Vaping Use   Vaping status: Never Used  Substance and Sexual Activity   Alcohol use: Not Currently   Drug use: Never   Sexual activity: Yes    Birth  control/protection: None  Other Topics Concern   Not on file  Social History Narrative   Lives with husband   Social Drivers of Health   Financial Resource Strain: Medium Risk (05/18/2023)   Overall Financial Resource Strain (CARDIA)    Difficulty of Paying Living Expenses: Somewhat hard  Food Insecurity: No Food Insecurity (05/18/2023)   Hunger Vital Sign    Worried About Running Out of Food in the Last Year: Never true    Ran Out of Food in the Last Year: Never true  Transportation Needs: No Transportation Needs (05/18/2023)   PRAPARE - Administrator, Civil Service (Medical): No    Lack of Transportation (Non-Medical): No  Physical Activity: Insufficiently Active (05/18/2023)   Exercise Vital Sign    Days of Exercise per Week: 4 days    Minutes of Exercise per Session: 30 min  Stress: No Stress Concern Present (05/18/2023)   [employer] of Occupational Health - Occupational Stress  Questionnaire    Feeling of Stress : Not at all  Social Connections: Moderately Isolated (05/18/2023)   Social Connection and Isolation Panel [NHANES]    Frequency of Communication with Friends and Family: More than three times a week    Frequency of Social Gatherings with Friends and Family: More than three times a week    Attends Religious Services: Never    Database administrator or Organizations: No    Attends Banker Meetings: Never    Marital Status: Married  Catering manager Violence: Not At Risk (05/18/2023)   Humiliation, Afraid, Rape, and Kick questionnaire    Fear of Current or Ex-Partner: No    Emotionally Abused: No    Physically Abused: No    Sexually Abused: No    Family History  Problem Relation Age of Onset   Breast cancer Neg Hx      Review of Systems  Constitutional: Negative.  Negative for chills and fever.  HENT: Negative.  Negative for congestion and sore throat.   Respiratory: Negative.  Negative for cough and shortness of breath.   Cardiovascular: Negative.  Negative for chest pain and palpitations.  Gastrointestinal:  Negative for abdominal pain, diarrhea, nausea and vomiting.  Genitourinary: Negative.  Negative for dysuria and hematuria.  Skin: Negative.  Negative for rash.  Neurological: Negative.  Negative for dizziness and headaches.  All other systems reviewed and are negative.   Vitals:   05/21/23 1055  BP: 128/84  Pulse: 65  Temp: 98.5 F (36.9 C)  SpO2: 94%    Physical Exam Vitals reviewed.  Constitutional:      Appearance: Normal appearance.  HENT:     Head: Normocephalic.     Right Ear: Tympanic membrane, ear canal and external ear normal.     Left Ear: Tympanic membrane, ear canal and external ear normal.     Mouth/Throat:     Mouth: Mucous membranes are moist.     Pharynx: Oropharynx is clear.  Eyes:     Extraocular Movements: Extraocular movements intact.     Conjunctiva/sclera: Conjunctivae normal.     Pupils:  Pupils are equal, round, and reactive to light.  Cardiovascular:     Rate and Rhythm: Normal rate and regular rhythm.     Pulses: Normal pulses.     Heart sounds: Normal heart sounds.  Pulmonary:     Effort: Pulmonary effort is normal.     Breath sounds: Normal breath sounds.  Abdominal:     Palpations: Abdomen is  soft.     Tenderness: There is no abdominal tenderness.  Musculoskeletal:     Cervical back: No tenderness.  Lymphadenopathy:     Cervical: No cervical adenopathy.  Skin:    General: Skin is warm and dry.  Neurological:     General: No focal deficit present.     Mental Status: She is alert and oriented to person, place, and time.  Psychiatric:        Mood and Affect: Mood normal.        Behavior: Behavior normal.      ASSESSMENT & PLAN: Problem List Items Addressed This Visit       Cardiovascular and Mediastinum   Aortic atherosclerosis (HCC)   Diet and nutrition discussed Not taking statin medication at present time Lipid profile done today        Respiratory   OSA (obstructive sleep apnea)   Well-documented diagnosis in the past On CPAP treatment but finds it very uncomfortable Wants to try new approach like Inspire Has appointment with pulmonary sleep clinic June 5        Musculoskeletal and Integument   Age-related osteoporosis without current pathological fracture   However no report available yet        Other   Insomnia   Chronic and psychophysiological in nature. Has tried different over-the-counter and prescription medications with marginal results.  Melatonin discussed. Mental health discussed.  Denies depression or anxiety. Was able to follow-up with psychiatrist today Started on doxepin. Seroquel  discontinued Gabapentin  discontinued      Urinary incontinence   States she was evaluated by urologist for this and prescribed Myrbetriq 50 mg daily. Records not available for my review.      Abdominal discomfort   Intermittent  symptoms over the past several months Clinically stable.  No red flag signs or symptoms. Benign abdominal examination. Requesting stool examination for parasites Concerned about fatty liver.      Relevant Orders   CBC with Differential/Platelet   Comprehensive metabolic panel with GFR   GI Profile, Stool, PCR   Other Visit Diagnoses       Encounter for general adult medical examination with abnormal findings    -  Primary   Relevant Orders   CBC with Differential/Platelet   Comprehensive metabolic panel with GFR   Hemoglobin A1c     Screening for deficiency anemia       Relevant Orders   CBC with Differential/Platelet     Screening for lipoid disorders       Relevant Orders   Lipid panel     Screening for endocrine, metabolic and immunity disorder       Relevant Orders   Comprehensive metabolic panel with GFR   Hemoglobin A1c   TSH   Vitamin B12   VITAMIN D 25 Hydroxy (Vit-D Deficiency, Fractures)   Vitamin E      Modifiable risk factors discussed with patient. Anticipatory guidance according to age provided. The following topics were also discussed: Social Determinants of Health Smoking.  Non-smoker Diet and nutrition Benefits of exercise Cancer screening and need for breast and colon cancer screening Vaccinations review and recommendations Cardiovascular risk assessment and need for blood work Mental health including depression and anxiety Fall and accident prevention  Patient Instructions  Mantenimiento de Radiographer, therapeutic en las mujeres Health Maintenance, Female Adoptar un estilo de vida saludable y recibir atencin preventiva son importantes para promover la salud y Counsellor. Consulte al mdico sobre: El esquema adecuado para Sales executive  y exmenes peridicos. Cosas que puede hacer por su cuenta para prevenir enfermedades y [place] sano. Qu debo saber sobre la dieta, el peso y el ejercicio? Consuma una dieta saludable  Consuma una dieta que incluya  muchas verduras, frutas, productos lcteos con bajo contenido de grasa y protenas magras. No consuma muchos alimentos ricos en grasas slidas, azcares agregados o sodio. Mantenga un peso saludable El ndice de masa muscular [facility]) se [place] para identificar problemas de [place]. Proporciona una estimacin de la grasa corporal basndose en el peso y la altura. Su mdico puede ayudarle a determinar su [facility] y a Personnel officer o Pharmacologist un peso saludable. Haga ejercicio con regularidad Haga ejercicio con regularidad. Esta es una de las prcticas ms importantes que puede hacer por su salud. La [employer] de los adultos deben seguir estas pautas: Education officer, environmental, al menos, 150 minutos de actividad fsica por semana. El ejercicio debe aumentar la frecuencia cardaca y Media planner transpirar (ejercicio de intensidad moderada). Hacer ejercicios de fortalecimiento por lo [employer] por semana. Agregue esto a su plan de ejercicio de intensidad moderada. Pase menos tiempo sentada. Incluso la actividad fsica ligera puede ser beneficiosa. Controle sus niveles de colesterol y lpidos en la sangre Comience a realizarse anlisis de lpidos y Oncologist en la sangre a los 20 aos y luego reptalos cada 5 aos. Hgase controlar los niveles de colesterol con mayor frecuencia si: Sus niveles de lpidos y colesterol son altos. Es mayor de 40 aos. Presenta un alto riesgo de padecer enfermedades cardacas. Qu debo saber sobre las pruebas de deteccin del cncer? Segn su historia clnica y sus antecedentes familiares, es posible que deba realizarse pruebas de deteccin del cncer en diferentes edades. Esto puede incluir pruebas de deteccin de lo siguiente: Cncer de mama. Cncer de cuello uterino. Cncer colorrectal. Cncer de piel. Cncer de pulmn. Qu debo saber sobre la enfermedad cardaca, la diabetes y la hipertensin arterial? Presin arterial y enfermedad cardaca La hipertensin arterial causa enfermedades cardacas y  [place] el riesgo de accidente cerebrovascular. Es ms probable que esto se manifieste en las personas que tienen lecturas de presin arterial alta o tienen sobrepeso. Hgase controlar la presin arterial: Cada 3 a 5 aos si tiene entre 18 y 11 aos. Todos los aos si es mayor de 40 aos. Diabetes Realcese exmenes de deteccin de la diabetes con regularidad. Este anlisis revisa el nivel de azcar en la sangre en [place]. Hgase las pruebas de deteccin: Cada tres aos despus de los 40 aos de edad si tiene un peso normal y un bajo riesgo de padecer diabetes. Con ms frecuencia y a partir de [place] edad inferior si tiene sobrepeso o un alto riesgo de padecer diabetes. Qu debo saber sobre la prevencin de infecciones? Hepatitis B Si tiene un riesgo ms alto de contraer hepatitis B, debe someterse a un examen de deteccin de este virus. Hable con el mdico para averiguar si tiene riesgo de contraer la infeccin por hepatitis B. Hepatitis C Se recomienda el anlisis a: [employer] 1945 y 1965. Todas las personas que tengan un riesgo de haber contrado hepatitis C. Enfermedades de transmisin sexual (ETS) Hgase las pruebas de Airline pilot de ITS, incluidas la gonorrea y la clamidia, si: Es sexualmente activa y es menor de [address]. Es mayor de [address], y Public affairs consultant informa que corre riesgo de tener este tipo de infecciones. La actividad sexual ha cambiado desde que le hicieron la ltima prueba de deteccin y tiene un riesgo  mayor de tener clamidia o gonorrea. Pregntele al mdico si usted tiene riesgo. Pregntele al mdico si usted tiene un alto riesgo de Primary school teacher VIH. El mdico tambin puede recomendarle un medicamento recetado para ayudar a evitar la infeccin por el VIH. Si elige tomar medicamentos para prevenir el VIH, primero debe [employer] de deteccin del VIH. Luego debe hacerse anlisis cada 3 meses mientras est tomando los medicamentos. Embarazo Si est por  dejar de menstruar (fase premenopusica) y usted puede quedar embarazada, busque asesoramiento antes de quedar embarazada. Tome de 400 a 800 microgramos (mcg) de cido flico todos los das si queda embarazada. Pida mtodos de control de la natalidad (anticonceptivos) si desea evitar un embarazo no deseado. Osteoporosis y [place] La osteoporosis es una enfermedad en la que los huesos pierden los minerales y la fuerza por el avance de la edad. El resultado pueden ser fracturas en los [place]. Si tiene 65 aos o ms, o si est en riesgo de sufrir osteoporosis y fracturas, pregunte a su mdico si debe: Hacerse pruebas de deteccin de prdida sea. Tomar un suplemento de calcio o de vitamina D para reducir el riesgo de fracturas. Recibir terapia de reemplazo hormonal (TRH) para tratar los sntomas de la menopausia. Siga estas indicaciones en su casa: Consumo de alcohol No beba alcohol si: Su mdico le indica no hacerlo. Est embarazada, puede estar embarazada o est tratando de quedar embarazada. Si bebe alcohol: Limite la cantidad que bebe a lo siguiente: De 0 a 1 bebida por da. Sepa cunta cantidad de alcohol hay en las bebidas que toma. En los [address], una medida equivale a una botella de cerveza de 12 oz (355 ml), un vaso de vino de 5 oz (148 ml) o un vaso de una bebida alcohlica de alta graduacin de 1 oz (44 ml). Estilo de vida No consuma ningn producto que contenga nicotina o tabaco. Estos productos incluyen cigarrillos, tabaco para Theatre manager y aparatos de vapeo, como los cigarrillos electrnicos. Si necesita ayuda para dejar de consumir estos productos, consulte al mdico. No consuma drogas. No comparta agujas. Solicite ayuda a su mdico si necesita apoyo o informacin para abandonar las drogas. Indicaciones generales Realcese los estudios de rutina de [address], dentales y de Wellsite geologist. Mantngase al da con las vacunas. Infrmele a su mdico si: Se siente deprimida con  frecuencia. Alguna vez ha sido vctima de maltrato o no se siente seguro en su casa. Resumen Adoptar un estilo de vida saludable y recibir atencin preventiva son importantes para promover la salud y Counsellor. Siga las instrucciones del mdico acerca de una dieta saludable, el ejercicio y la realizacin de pruebas o exmenes para Hotel manager. Siga las instrucciones del mdico con respecto al control del colesterol y la presin arterial. Esta informacin no tiene Theme park manager el consejo del mdico. Asegrese de hacerle al mdico cualquier pregunta que tenga. Document Revised: 06/07/2020 Document Reviewed: 06/07/2020 Elsevier Patient Education  2024 Elsevier Inc.      Maryagnes Small, MD [facility]

## 2023-05-22 MED ORDER — DOXEPIN HCL 25 MG PO CAPS: 25.0000 mg | ORAL_CAPSULE | Freq: Every evening | ORAL | 0 refills | Status: DC | PRN

## 2023-05-22 NOTE — Telephone Encounter (Signed)
Resend again

## 2023-05-24 LAB — GI PROFILE, STOOL, PCR

## 2023-05-25 LAB — VITAMIN E
Gamma-Tocopherol (Vit E): 1.6 mg/L (ref <4.4)
Vitamin E (Alpha Tocopherol): 19.8 mg/L (ref 5.7–19.9)

## 2023-05-25 NOTE — Telephone Encounter (Signed)
 It does not need to be resent, it needed a prior authorization - this was sent in to Cover my Meds this morning

## 2023-05-26 ENCOUNTER — Telehealth (HOSPITAL_COMMUNITY): Payer: Self-pay

## 2023-05-26 NOTE — Telephone Encounter (Signed)
 Patients insurance approved the doxepin  for 01/14/2023 - 01/13/2024

## 2023-06-05 ENCOUNTER — Encounter: Payer: Self-pay | Admitting: Emergency Medicine

## 2023-06-11 DIAGNOSIS — S5422XD Injury of radial nerve at forearm level, left arm, subsequent encounter: Secondary | ICD-10-CM | POA: Diagnosis not present

## 2023-06-15 DIAGNOSIS — S4422XD Injury of radial nerve at upper arm level, left arm, subsequent encounter: Secondary | ICD-10-CM | POA: Diagnosis not present

## 2023-06-17 DIAGNOSIS — M25642 Stiffness of left hand, not elsewhere classified: Secondary | ICD-10-CM | POA: Diagnosis not present

## 2023-06-17 DIAGNOSIS — M79602 Pain in left arm: Secondary | ICD-10-CM | POA: Diagnosis not present

## 2023-06-17 DIAGNOSIS — R29898 Other symptoms and signs involving the musculoskeletal system: Secondary | ICD-10-CM | POA: Diagnosis not present

## 2023-06-18 ENCOUNTER — Ambulatory Visit: Admitting: Nurse Practitioner

## 2023-06-21 NOTE — Progress Notes (Signed)
 06/22/23- 68 yoF followed for OSA, complicated by ASCVD, DDD/ back pain LOV Dr Jude 02/05/18- on CPAP 5-12 NPSG 08/14/17- AHI 6.7/hr, desat to 86%, body weight 150 lbs Body weight today- 155 lbs Download compliance 87%, AHI 3.9/hr She is compliant with CPAP but asks if she still needs it, and about alternatives. We discussed update sleep study to reassess.  Prior to Admission medications   Medication Sig Start Date End Date Taking? Authorizing Provider  acetaminophen  (TYLENOL ) 500 MG tablet Take 2 tablets (1,000 mg total) by mouth every 6 (six) hours as needed for mild pain or fever. 05/27/22  Yes Vicci Sor R, PA-C  ASCORBIC ACID PO Take 1 tablet by mouth daily. Vitamin C, unknown strength.   Yes [provider]  Cholecalciferol  (VITAMIN D -3 PO) Take 1 capsule by mouth daily.   Yes [provider]  Cyanocobalamin  (VITAMIN B-12 PO) Take 1 tablet by mouth daily.   Yes [provider]  MAGNESIUM  OXIDE PO Take 1 tablet by mouth daily.   Yes [provider]  mirabegron ER (MYRBETRIQ) 50 MG TB24 tablet Take 1 tablet by mouth daily. 03/14/21  Yes [provider]  MORINGA OLEIFERA PO Take 1 capsule by mouth daily.   Yes [provider]  Omega-3 Fatty Acids (OMEGA 3 500 PO) Take 500 mg by mouth daily at 2 am.   Yes [provider]  OVER THE COUNTER MEDICATION Take 1 tablet by mouth daily. Vitamin A + Vitamin D3 combination supplement.   Yes [provider]  POTASSIUM GLUCONATE PO Take 1 tablet by mouth daily.   Yes [provider]  rosuvastatin  (CRESTOR ) 10 MG tablet Take 1 tablet (10 mg total) by mouth daily. 05/21/23  Yes Sagardia, Emil Schanz, MD  Vitamin D , Ergocalciferol , (DRISDOL ) 1.25 MG (50000 UNIT) CAPS capsule Take 1 capsule (50,000 Units total) by mouth every 7 (seven) days. 05/21/23  Yes Sagardia, Miguel Jose, MD  VITAMIN E PO Take 1 capsule by mouth daily.   Yes [provider]  diphenhydrAMINE  (BENADRYL ) 25  MG tablet Take 1 tablet (25 mg total) by mouth every 8 (eight) hours as needed for up to 5 days. 09/03/22 09/08/22  Waddell Sluder, PA-C  estradiol  (ESTRACE  VAGINAL) 0.1 MG/GM vaginal cream Place 1 g vaginally at bedtime. 07/10/23   Purcell Emil Schanz, MD  gabapentin  (NEURONTIN ) 300 MG capsule Take 1 capsule (300 mg total) by mouth 3 (three) times daily. 07/10/23   Purcell Emil Schanz, MD  naproxen  (NAPROSYN ) 500 MG tablet Take 1 tablet (500 mg total) by mouth 2 (two) times daily as needed. 07/10/23   Purcell Emil Schanz, MD   Past Medical History:  Diagnosis Date   Arthritis    Osteoporosis    Sleep apnea    Past Surgical History:  Procedure Laterality Date   APPLICATION OF WOUND VAC Left 06/04/2022   Procedure: APPLICATION OF WOUND VAC;  Surgeon: Lowery Estefana RAMAN, DO;  Location: [facility];  Service: Plastics;  Laterality: Left;   APPLICATION OF WOUND VAC Left 06/12/2022   Procedure: APPLICATION OF WOUND VAC;  Surgeon: Lowery Estefana RAMAN, DO;  Location: [facility];  Service: Plastics;  Laterality: Left;   APPLICATION OF WOUND VAC Left 07/28/2022   Procedure: APPLICATION OF WOUND VAC;  Surgeon: Lowery Estefana RAMAN, DO;  Location: [facility];  Service: Plastics;  Laterality: Left;   AUGMENTATION MAMMAPLASTY     DEBRIDEMENT AND CLOSURE WOUND Left 05/26/2022   Procedure: ARM DEBRIDEMENT  WITH MYRIAD PLACEMENT AND CHANGE WOUND VAC;  Surgeon: Lowery Estefana RAMAN, DO;  Location: [facility];  Service: Plastics;  Laterality: Left;   DEBRIDEMENT AND CLOSURE WOUND Left 06/04/2022   Procedure: ARM DEBRIDEMENT WITH MYRIAD PLACEMENT, wound vac change;  Surgeon: Lowery Estefana RAMAN, DO;  Location: [facility];  Service: Plastics;  Laterality: Left;   DEBRIDEMENT AND CLOSURE WOUND Left 07/28/2022   Procedure: Left arm wound excision with myriad and skin graft placement with VAC;  Surgeon: Lowery Estefana RAMAN, DO;  Location: [facility];  Service: Plastics;  Laterality: Left;   hip replacements Bilateral    INCISION AND DRAINAGE OF WOUND Left 05/20/2022   Procedure: IRRIGATION AND DEBRIDEMENT AND WOUND VAC PLACEMENT LEFT ARM;  Surgeon: Delene Marsa HERO, MD;  Location: [facility];  Service: Orthopedics;  Laterality: Left;   MINOR IRRIGATION AND DEBRIDEMENT OF WOUND Right 05/20/2022   Procedure: IRRIGATION AND DEBRIDEMENT OF WOUND RIGHT ARM;  Surgeon: Delene Marsa HERO, MD;  Location: [facility];  Service: Orthopedics;  Laterality: Right;   SKIN SPLIT GRAFT Left 07/28/2022   Procedure: SKIN GRAFT SPLIT THICKNESS;  Surgeon: Lowery Estefana RAMAN, DO;  Location: [facility];  Service: Plastics;  Laterality: Left;   Family History  Problem Relation Age of Onset   Breast cancer Neg Hx    Social History   Socioeconomic History   Marital status: Married    Spouse name: Marcus Baller   Number of children: Not on file   Years of education: Not on file   Highest education level: 7th grade  Occupational History   Not on file  Tobacco Use   Smoking status: Former    Current packs/day: 0.00    Average packs/day: 0.5 packs/day for 35.0 years (17.5 ttl pk-yrs)    Types: Cigarettes    Start date: 04/03/1975    Quit date: 04/03/2010    Years since quitting: 13.3   Smokeless tobacco: Never  Vaping Use   Vaping status: Never Used  Substance and Sexual Activity   Alcohol use: Not Currently   Drug use: Never   Sexual activity: Yes    Birth control/protection: None  Other Topics Concern   Not on file  Social History Narrative   Lives with husband   Social Drivers of Health   Financial Resource Strain: Medium Risk (05/18/2023)   Overall Financial Resource Strain (CARDIA)    Difficulty of Paying Living Expenses: Somewhat hard  Food Insecurity: No Food Insecurity (05/18/2023)   Hunger Vital Sign    Worried About Running Out of Food in the Last Year: Never true    Ran Out of Food in the Last Year: Never true   Transportation Needs: No Transportation Needs (05/18/2023)   PRAPARE - Administrator, Civil Service (Medical): No    Lack of Transportation (Non-Medical): No  Physical Activity: Insufficiently Active (05/18/2023)   Exercise Vital Sign    Days of Exercise per Week: 4 days    Minutes of Exercise per Session: 30 min  Stress: No Stress Concern Present (05/18/2023)   [employer] of Occupational Health - Occupational Stress Questionnaire    Feeling of Stress : Not at all  Social Connections: Moderately Isolated (05/18/2023)   Social Connection and Isolation Panel    Frequency of Communication with Friends and Family: More than three times a week    Frequency of Social Gatherings with Friends and Family: More than three times a week    Attends  Religious Services: Never    Active Member of Clubs or Organizations: No    Attends Banker Meetings: Never    Marital Status: Married  Catering manager Violence: Not At Risk (05/18/2023)   Humiliation, Afraid, Rape, and Kick questionnaire    Fear of Current or Ex-Partner: No    Emotionally Abused: No    Physically Abused: No    Sexually Abused: No   ROS-see HPI   + = postive Constitutional:    weight loss, night sweats, fevers, chills, fatigue, lassitude. HEENT:    headaches, difficulty swallowing, tooth/dental problems, sore throat,       sneezing, itching, ear ache, nasal congestion, post nasal drip, snoring CV:    chest pain, orthopnea, PND, swelling in lower extremities, anasarca,                                   dizziness, palpitations Resp:   shortness of breath with exertion or at rest.                productive cough,   non-productive cough, coughing up of blood.              change in color of mucus.  wheezing.   Skin:    rash or lesions. GI:  No-   heartburn, indigestion, abdominal pain, nausea, vomiting, diarrhea,                 change in bowel habits, loss of appetite GU: dysuria, change in color of urine, no  urgency or frequency.   flank pain. MS:   joint pain, stiffness, decreased range of motion, back pain. Neuro-     nothing unusual Psych:  change in mood or affect.  depression or anxiety.   memory loss.  OBJ- Physical Exam General- Alert, Oriented, Affect-appropriate, Distress- none acute, overweight Skin- rash-none, lesions- none, excoriation- none Lymphadenopathy- none Head- atraumatic            Eyes- Gross vision intact, PERRLA, conjunctivae and secretions clear            Ears- Hearing, canals-normal            Nose- Clear, no-Septal dev, mucus, polyps, erosion, perforation             Throat- Mallampati II-III , mucosa clear , drainage- none, tonsils- atrophic Neck- flexible , trachea midline, no stridor , thyroid  nl, carotid no bruit Chest - symmetrical excursion , unlabored           Heart/CV- RRR , no murmur , no gallop  , no rub, nl s1 s2                           - JVD- none , edema- none, stasis changes- none, varices- none           Lung- clear to P&A, wheeze- none, cough- none , dullness-none, rub- none           Chest wall-  Abd-  Br/ Gen/ Rectal- Not done, not indicated Extrem- cyanosis- none, clubbing, none, atrophy- none, strength- nl Neuro- grossly intact to observation

## 2023-06-22 ENCOUNTER — Ambulatory Visit: Admitting: Internal Medicine

## 2023-06-22 ENCOUNTER — Encounter: Payer: Self-pay | Admitting: Internal Medicine

## 2023-06-22 VITALS — BP 124/78 | HR 61 | Temp 97.7°F | Ht 61.0 in | Wt 155.4 lb

## 2023-06-22 DIAGNOSIS — I700 Atherosclerosis of aorta: Secondary | ICD-10-CM | POA: Diagnosis not present

## 2023-06-22 DIAGNOSIS — G4733 Obstructive sleep apnea (adult) (pediatric): Secondary | ICD-10-CM

## 2023-06-22 NOTE — Patient Instructions (Signed)
 Order- schedule home sleep test ( off CPAP)   dx OSA  Please call us  about 2 weeks after your sleep test for results and recommendations. It may be that you will be a good candidate to try a mouthpiece instead of CPAP.

## 2023-06-23 ENCOUNTER — Telehealth (HOSPITAL_COMMUNITY): Admitting: Psychiatry

## 2023-06-24 DIAGNOSIS — M25642 Stiffness of left hand, not elsewhere classified: Secondary | ICD-10-CM | POA: Diagnosis not present

## 2023-06-24 DIAGNOSIS — M79602 Pain in left arm: Secondary | ICD-10-CM | POA: Diagnosis not present

## 2023-06-24 DIAGNOSIS — M25522 Pain in left elbow: Secondary | ICD-10-CM | POA: Diagnosis not present

## 2023-06-24 DIAGNOSIS — R29898 Other symptoms and signs involving the musculoskeletal system: Secondary | ICD-10-CM | POA: Diagnosis not present

## 2023-07-01 DIAGNOSIS — R29898 Other symptoms and signs involving the musculoskeletal system: Secondary | ICD-10-CM | POA: Diagnosis not present

## 2023-07-01 DIAGNOSIS — M25522 Pain in left elbow: Secondary | ICD-10-CM | POA: Diagnosis not present

## 2023-07-01 DIAGNOSIS — M25642 Stiffness of left hand, not elsewhere classified: Secondary | ICD-10-CM | POA: Diagnosis not present

## 2023-07-01 DIAGNOSIS — M79602 Pain in left arm: Secondary | ICD-10-CM | POA: Diagnosis not present

## 2023-07-06 ENCOUNTER — Telehealth: Payer: Self-pay | Admitting: Emergency Medicine

## 2023-07-06 ENCOUNTER — Other Ambulatory Visit: Payer: Self-pay | Admitting: Emergency Medicine

## 2023-07-06 NOTE — Telephone Encounter (Signed)
 Copied from CRM [id number]. Topic: Clinical - Medication Question >> Jul 06, 2023 10:22 AM Berwyn MATSU wrote: Reason for CRM: Patient and [facility] called in requesting update on osteoporosis injection referral. I advised I don't see any order. Per [employer] no order on there end either. I advised I will send message.  May you please advise.

## 2023-07-06 NOTE — Telephone Encounter (Signed)
 Copied from CRM [id number]. Topic: Clinical - Medication Refill >> Jul 06, 2023  9:06 AM Suzen RAMAN wrote: Medication: naproxen  (NAPROSYN ) 500 MG tablet QUEtiapine  (SEROQUEL ) 100 MG tablet estradiol (ESTRACE VAGINAL) 0.1 MG/GM vaginal cream gabapentin  (NEURONTIN ) 300 MG capsule  patient would prefer 90 day suplly for medication if possible   Has the patient contacted their pharmacy? Yes  This is the patient's preferred pharmacy:  [id number] [place], [place] - [address] AT [facility] & [address] [place] [address] Phone: [phone] Fax: [phone]  Is this the correct pharmacy for this prescription? Yes If no, delete pharmacy and type the correct one.   Has the prescription been filled recently? No  Is the patient out of the medication? No  Has the patient been seen for an appointment in the last year OR does the patient have an upcoming appointment? Yes  Can we respond through MyChart? Yes  Agent: Please be advised that Rx refills may take up to 3 business days. We ask that you follow-up with your pharmacy.

## 2023-07-08 DIAGNOSIS — M79602 Pain in left arm: Secondary | ICD-10-CM | POA: Diagnosis not present

## 2023-07-08 DIAGNOSIS — M25642 Stiffness of left hand, not elsewhere classified: Secondary | ICD-10-CM | POA: Diagnosis not present

## 2023-07-08 DIAGNOSIS — R29898 Other symptoms and signs involving the musculoskeletal system: Secondary | ICD-10-CM | POA: Diagnosis not present

## 2023-07-08 DIAGNOSIS — M25522 Pain in left elbow: Secondary | ICD-10-CM | POA: Diagnosis not present

## 2023-07-09 DIAGNOSIS — R600 Localized edema: Secondary | ICD-10-CM | POA: Diagnosis not present

## 2023-07-10 ENCOUNTER — Other Ambulatory Visit: Payer: Self-pay | Admitting: Psychiatry

## 2023-07-10 DIAGNOSIS — F5101 Primary insomnia: Secondary | ICD-10-CM

## 2023-07-10 MED ORDER — GABAPENTIN 300 MG PO CAPS: 300.0000 mg | ORAL_CAPSULE | Freq: Three times a day (TID) | ORAL | 1 refills | Status: AC

## 2023-07-10 MED ORDER — NAPROXEN 500 MG PO TABS: 500.0000 mg | ORAL_TABLET | Freq: Two times a day (BID) | ORAL | 1 refills | Status: DC | PRN

## 2023-07-10 MED ORDER — ESTRADIOL 0.1 MG/GM VA CREA: 1.0000 g | TOPICAL_CREAM | Freq: Every day | VAGINAL | 1 refills | Status: AC

## 2023-07-17 DIAGNOSIS — Z0100 Encounter for examination of eyes and vision without abnormal findings: Secondary | ICD-10-CM | POA: Diagnosis not present

## 2023-07-17 DIAGNOSIS — H2513 Age-related nuclear cataract, bilateral: Secondary | ICD-10-CM | POA: Diagnosis not present

## 2023-07-22 DIAGNOSIS — M25522 Pain in left elbow: Secondary | ICD-10-CM | POA: Diagnosis not present

## 2023-07-22 DIAGNOSIS — M25642 Stiffness of left hand, not elsewhere classified: Secondary | ICD-10-CM | POA: Diagnosis not present

## 2023-07-22 DIAGNOSIS — R29898 Other symptoms and signs involving the musculoskeletal system: Secondary | ICD-10-CM | POA: Diagnosis not present

## 2023-07-22 DIAGNOSIS — M79602 Pain in left arm: Secondary | ICD-10-CM | POA: Diagnosis not present

## 2023-07-24 NOTE — Assessment & Plan Note (Signed)
 Benefits from CPAP but appropriate now to reassess Plan- home sleep test. Possibly OAP as alternative to CPAP

## 2023-07-24 NOTE — Assessment & Plan Note (Signed)
 Discussed medical problems associated with untreated OSA

## 2023-07-27 ENCOUNTER — Telehealth (HOSPITAL_BASED_OUTPATIENT_CLINIC_OR_DEPARTMENT_OTHER): Admitting: Psychiatry

## 2023-07-27 ENCOUNTER — Encounter (HOSPITAL_COMMUNITY): Payer: Self-pay | Admitting: Psychiatry

## 2023-07-27 VITALS — Wt 155.0 lb

## 2023-07-27 DIAGNOSIS — F5101 Primary insomnia: Secondary | ICD-10-CM

## 2023-07-27 MED ORDER — ZOLPIDEM TARTRATE 10 MG PO TABS
10.0000 mg | ORAL_TABLET | Freq: Every day | ORAL | 0 refills | Status: DC
Start: 2023-07-27 — End: 2023-08-24

## 2023-07-27 NOTE — Progress Notes (Signed)
 [facility] MD/PA/NP OP Progress Note  Virtual Visit via Video Note  I connected with [name] on 07/27/23 at  1:00 PM EDT by a video enabled telemedicine application and verified that I am speaking with the correct person using two identifiers.  Location: Patient: In Car Provider: Home Office   I discussed the limitations of evaluation and management by telemedicine and the availability of in person appointments. The patient expressed understanding and agreed to proceed.  07/27/2023 1:06 PM Maanya [employer]  MRN:  [id number]  Chief Complaint:  Chief Complaint  Patient presents with   Insomnia   HPI: Patient is evaluated by video session.  She is a 68 year old [place] American married female who was seen 6 weeks ago as referral from PCP for insomnia.  She was taking Seroquel  which was discontinued because PCP does not want to continue.  Patient told Seroquel  was working however when explained about the indication she refused to take because she did not believe she has depression, psychosis, mania, anxiety.  Her biggest concern is lack of sleep.  She uses CPAP does not feel it is working very well.  We have recommended to see a pulmonologist/sleep doctor and she had appointment but is still waiting for further information.  We tried doxepin  as she took up to 50 mg but did not feel any better.  She feels it make her more irritable.  Now she like to go higher dose however I recommend it would be not safe as higher dose can cause more side effects.  She denies any feeling of hopelessness or worthlessness.  She denies any active or passive suicidal thoughts or homicidal thoughts but she works part-time 6 hours a housekeeping.  She lives with her husband who she has been married for 9 years.  She is a 83 year old grandson who also lives with them.  She denies any nightmares or flashbacks.  She has a good social support through [employer].  She is not interested in therapy.  Her appetite is okay.  Her  weight is stable.  She takes gabapentin  for neuropathy.  In the past she had tried multiple medication including mirtazapine, trazodone , hydroxyzine , Xanax, nortriptyline, Seroquel  and recently doxepin .  Patient denies drinking or using any illegal substances. 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  However prescribed nortriptyline, mirtazapine, trazodone , hydroxyzine , Xanax and Seroquel  from her primary care.   Past Medical History:  Past Medical History:  Diagnosis Date   Arthritis    Osteoporosis    Sleep apnea     Past Surgical History:  Procedure Laterality Date   APPLICATION OF WOUND VAC Left 06/04/2022   Procedure: APPLICATION OF WOUND VAC;  Surgeon: Lowery Estefana RAMAN, DO;  Location: [facility];  Service: Plastics;  Laterality: Left;   APPLICATION OF WOUND VAC Left 06/12/2022   Procedure: APPLICATION OF WOUND VAC;  Surgeon: Lowery Estefana RAMAN, DO;  Location: [facility];  Service: Plastics;  Laterality: Left;   APPLICATION OF WOUND VAC Left 07/28/2022   Procedure: APPLICATION OF WOUND VAC;  Surgeon: Lowery Estefana RAMAN, DO;  Location: [facility];  Service: Plastics;  Laterality: Left;   AUGMENTATION MAMMAPLASTY     DEBRIDEMENT AND CLOSURE WOUND Left 05/26/2022   Procedure: ARM DEBRIDEMENT WITH MYRIAD PLACEMENT AND CHANGE WOUND VAC;  Surgeon: Lowery Estefana RAMAN, DO;  [facility];  Service:  Plastics;  Laterality: Left;   DEBRIDEMENT AND CLOSURE WOUND Left 06/04/2022   Procedure: ARM DEBRIDEMENT WITH MYRIAD PLACEMENT, wound vac change;  Surgeon: Lowery Estefana RAMAN, DO;  Location: [facility];  Service: Plastics;  Laterality: Left;   DEBRIDEMENT AND CLOSURE WOUND Left 07/28/2022   Procedure: Left arm wound excision with myriad and skin graft placement with VAC;  Surgeon: Lowery Estefana RAMAN, DO;  Location: [facility];  Service: Plastics;  Laterality: Left;   hip replacements Bilateral    INCISION AND DRAINAGE OF WOUND Left 05/20/2022   Procedure: IRRIGATION AND DEBRIDEMENT AND WOUND VAC PLACEMENT LEFT ARM;  Surgeon: Delene Marsa HERO, MD;  Location[facility];  Service: Orthopedics;  Laterality: Left;   MINOR IRRIGATION AND DEBRIDEMENT OF WOUND Right 05/20/2022   Procedure: IRRIGATION AND DEBRIDEMENT OF WOUND RIGHT ARM;  Surgeon: Delene Marsa HERO, MD;  [facility];  Service: Orthopedics;  Laterality: Right;   SKIN SPLIT GRAFT Left 07/28/2022   Procedure: SKIN GRAFT SPLIT THICKNESS;  Surgeon: Lowery Estefana RAMAN, DO;  Location: [facility];  Service: Plastics;  Laterality: Left;    Family Psychiatric History: Reviewed  Family History:  Family History  Problem Relation Age of Onset   Breast cancer Neg Hx     Social History:  Social History   Socioeconomic History   Marital status: Married    Spouse name: Marcus Baller   Number of children: Not on file   Years of education: Not on file   Highest education level: 7th grade  Occupational History   Not on file  Tobacco Use   Smoking status: Former    Current packs/day: 0.00    Average packs/day: 0.5 packs/day for 35.0 years (17.5 ttl pk-yrs)    Types: Cigarettes    Start date: 04/03/1975    Quit date: 04/03/2010    Years since quitting: 13.3   Smokeless tobacco: Never  Vaping Use   Vaping status: Never Used  Substance and Sexual Activity   Alcohol use: Not Currently   Drug use: Never   Sexual activity: Yes    Birth control/protection: None  Other Topics Concern   Not on file  Social History Narrative   Lives with husband   Social Drivers of Health   Financial Resource Strain: Medium Risk (05/18/2023)   Overall Financial Resource Strain (CARDIA)    Difficulty of Paying Living Expenses: Somewhat hard  Food Insecurity: No Food Insecurity (05/18/2023)   Hunger Vital Sign    Worried About Running  Out of Food in the Last Year: Never true    Ran Out of Food in the Last Year: Never true  Transportation Needs: No Transportation Needs (05/18/2023)   PRAPARE - Administrator, Civil Service (Medical): No    Lack of Transportation (Non-Medical): No  Physical Activity: Insufficiently Active (05/18/2023)   Exercise Vital Sign    Days of Exercise per Week: 4 days    Minutes of Exercise per Session: 30 min  Stress: No Stress Concern Present (05/18/2023)   [employer] of Occupational Health - Occupational Stress Questionnaire    Feeling of Stress : Not at all  Social Connections: Moderately Isolated (05/18/2023)   Social Connection and Isolation Panel    Frequency of Communication with Friends and Family: More than three times a week    Frequency of Social Gatherings with Friends and Family: More than three times a week    Attends Religious Services: Never  Active Member of Clubs or Organizations: No    Attends Banker Meetings: Never    Marital Status: Married    Allergies: No Known Allergies  Metabolic Disorder Labs: Lab Results  Component Value Date   HGBA1C 6.1 05/21/2023   No results found for: PROLACTIN Lab Results  Component Value Date   CHOL 242 (H) 05/21/2023   TRIG 237.0 (H) 05/21/2023   HDL 68.60 05/21/2023   CHOLHDL 4 05/21/2023   VLDL 47.4 (H) 05/21/2023   LDLCALC 126 (H) 05/21/2023   Lab Results  Component Value Date   TSH 1.27 05/21/2023    Therapeutic Level Labs: No results found for: LITHIUM No results found for: VALPROATE No results found for: CBMZ  Current Medications: Current Outpatient Medications  Medication Sig Dispense Refill   acetaminophen  (TYLENOL ) 500 MG tablet Take 2 tablets (1,000 mg total) by mouth every 6 (six) hours as needed for mild pain or fever.     ASCORBIC ACID PO Take 1 tablet by mouth daily. Vitamin C, unknown strength.     Cholecalciferol  (VITAMIN D -3 PO) Take 1 capsule by mouth daily.      Cyanocobalamin  (VITAMIN B-12 PO) Take 1 tablet by mouth daily.     diphenhydrAMINE  (BENADRYL ) 25 MG tablet Take 1 tablet (25 mg total) by mouth every 8 (eight) hours as needed for up to 5 days. 15 tablet 0   estradiol  (ESTRACE  VAGINAL) 0.1 MG/GM vaginal cream Place 1 g vaginally at bedtime. 42.5 g 1   gabapentin  (NEURONTIN ) 300 MG capsule Take 1 capsule (300 mg total) by mouth 3 (three) times daily. 270 capsule 1   MAGNESIUM  OXIDE PO Take 1 tablet by mouth daily.     mirabegron ER (MYRBETRIQ) 50 MG TB24 tablet Take 1 tablet by mouth daily.     MORINGA OLEIFERA PO Take 1 capsule by mouth daily.     naproxen  (NAPROSYN ) 500 MG tablet Take 1 tablet (500 mg total) by mouth 2 (two) times daily as needed. 30 tablet 1   Omega-3 Fatty Acids (OMEGA 3 500 PO) Take 500 mg by mouth daily at 2 am.     OVER THE COUNTER MEDICATION Take 1 tablet by mouth daily. Vitamin A + Vitamin D3 combination supplement.     POTASSIUM GLUCONATE PO Take 1 tablet by mouth daily.     rosuvastatin  (CRESTOR ) 10 MG tablet Take 1 tablet (10 mg total) by mouth daily. 90 tablet 3   Vitamin D , Ergocalciferol , (DRISDOL ) 1.25 MG (50000 UNIT) CAPS capsule Take 1 capsule (50,000 Units total) by mouth every 7 (seven) days. 7 capsule 1   VITAMIN E PO Take 1 capsule by mouth daily.     No current facility-administered medications for this visit.     Musculoskeletal: Strength & Muscle Tone: within normal limits Gait & Station: normal Patient leans: N/A  Psychiatric Specialty Exam: Review of Systems  Neurological:  Positive for numbness.  Psychiatric/Behavioral:  Positive for sleep disturbance.     Weight 155 lb (70.3 kg).There is no height or weight on file to calculate BMI.  General Appearance: Casual  Eye Contact:  Good  Speech:  Slow  Volume:  Normal  Mood:  Euthymic  Affect:  Congruent  Thought Process:  Descriptions of Associations: Intact  Orientation:  Full (Time, Place, and Person)  Thought Content: Rumination    Suicidal Thoughts:  No  Homicidal Thoughts:  No  Memory:  Immediate;   Fair Recent;   Fair Remote;   Fair  Judgement:  Intact  Insight:  Present  Psychomotor Activity:  Normal  Concentration:  Concentration: Fair and Attention Span: Fair  Recall:  Good  Fund of Knowledge: Good  Language: Fair  Akathisia:  No  Handed:  Right  AIMS (if indicated): not done  Assets:  Communication Skills Desire for Improvement Housing Social Support Talents/Skills Transportation  ADL's:  Intact  Cognition: WNL  Sleep:  Poor   Screenings: CAGE-AID    Flowsheet Row ED to Hosp-Admission (Discharged) from 05/20/2022 in [facility]  CAGE-AID Score 0   GAD-7    Flowsheet Row Office Visit [facility]  Total GAD-7 Score 0   PHQ2-9    Flowsheet Row Office Visit from 05/21/2023 in [facility] Most recent reading at 05/21/2023 11:14 AM Office Visit from 05/21/2023 in [facility] [place] Most recent reading at 05/21/2023 11:05 AM Clinical Support from 05/18/2023 in [facility] [place] Most recent reading at 05/18/2023  3:12 PM Office Visit from 03/18/2023 in [facility] [place] Most recent reading at 03/18/2023 10:39 AM  PHQ-2 Total Score 0 3 3 0  PHQ-9 Total Score 5 7 7  --   Flowsheet Row Office Visit from 05/21/2023 in [facility] ED from 09/03/2022 in [facility] Admission (Discharged) from 07/28/2022 in [facility]  C-SSRS RISK CATEGORY No Risk No Risk No Risk     Assessment and Plan: Patient is a 68 year old female with history of osteoarthritis, degenerative disc disease, neuropathy, sleep apnea, aortic atherosclerosis and chronic insomnia.  Review recent blood work results and notes from other providers.  Labs are  stable.  She has a visit with sleep doctor but still waiting for further workup.  She would like to increase the doxepin  75 however given the side effects from 50 mg I will not recommend.  I gave her option to try Ambien  and after some discussion she agreed to try.  I discussed it is hypnotic and she need to make sure not to drive after the medication and has dependency issues and should take it only as needed.  I also encourage consult with a sleep doctor if CPAP machine needs adjustment.  Other option is to increase her dose of gabapentin  that can also help her sleep but patient reluctant to try any other medication.  I will also forward my note to her care team.  Recommend not to drink or use any illegal substances with hypnotic.  Discussed dependency, tolerance and withdrawal.  Will follow-up in a month.  She is not interested in therapy.  Collaboration of Care: Collaboration of Care: Other provider involved in patient's care AEB notes are available in epic to review  Patient/Guardian was advised Release of Information must be obtained prior to any record release in order to collaborate their care with an outside provider. Patient/Guardian was advised if they have not already done so to contact the registration department to sign all necessary forms in order for us  to release information regarding their care.   Consent: Patient/Guardian gives verbal consent for treatment and assignment of benefits for services provided during this visit. Patient/Guardian expressed understanding and agreed to proceed.     Follow Up Instructions:    I discussed the assessment and treatment plan with the patient. The patient was provided an opportunity to ask questions and all were answered. The patient  agreed with the plan and demonstrated an understanding of the instructions.   The patient was advised to call back or seek an in-person evaluation if the symptoms worsen or if the condition fails to improve as  anticipated.  I provided 31 minutes of non-face-to-face time during this encounter.   Leni ONEIDA Client, MD   Leni ONEIDA Client, MD 07/27/2023, 1:06 PM

## 2023-07-29 ENCOUNTER — Other Ambulatory Visit: Payer: Self-pay | Admitting: Emergency Medicine

## 2023-07-29 NOTE — Telephone Encounter (Signed)
 Copied from CRM [id number]. Topic: Clinical - Medication Refill >> Jul 29, 2023  4:10 PM Aisha D wrote: Medication: gabapentin  (NEURONTIN ) 300 MG capsule,   Has the patient contacted their pharmacy? No (Agent: If no, request that the patient contact the pharmacy for the refill. If patient does not wish to contact the pharmacy document the reason why and proceed with request.) (Agent: If yes, when and what did the pharmacy advise?)  This is the patient's preferred pharmacy:  [id number] [address], [place] - [address] AT [facility] OF [facility] & [address] [place] [address] Phone: [phone] Fax: [phone]  Is this the correct pharmacy for this prescription? Yes If no, delete pharmacy and type the correct one.   Has the prescription been filled recently? Yes  Is the patient out of the medication? No  Has the patient been seen for an appointment in the last year OR does the patient have an upcoming appointment? Yes  Can we respond through MyChart? Yes  Agent: Please be advised that Rx refills may take up to 3 business days. We ask that you follow-up with your pharmacy.

## 2023-08-01 ENCOUNTER — Other Ambulatory Visit: Payer: Self-pay | Admitting: Emergency Medicine

## 2023-08-01 DIAGNOSIS — E559 Vitamin D deficiency, unspecified: Secondary | ICD-10-CM

## 2023-08-03 ENCOUNTER — Ambulatory Visit (INDEPENDENT_AMBULATORY_CARE_PROVIDER_SITE_OTHER): Admitting: Physician Assistant

## 2023-08-03 ENCOUNTER — Encounter: Payer: Self-pay | Admitting: Physician Assistant

## 2023-08-03 ENCOUNTER — Other Ambulatory Visit: Payer: Self-pay | Admitting: Physician Assistant

## 2023-08-03 ENCOUNTER — Other Ambulatory Visit: Payer: Self-pay | Admitting: Emergency Medicine

## 2023-08-03 DIAGNOSIS — M810 Age-related osteoporosis without current pathological fracture: Secondary | ICD-10-CM

## 2023-08-03 DIAGNOSIS — E559 Vitamin D deficiency, unspecified: Secondary | ICD-10-CM

## 2023-08-03 LAB — VITAMIN D 25 HYDROXY (VIT D DEFICIENCY, FRACTURES): Vit D, 25-Hydroxy: 39 ng/mL (ref 30–100)

## 2023-08-03 NOTE — Telephone Encounter (Signed)
 FYI Only or Action Required?: Action required by provider: medication refill request.  Patient was last seen in primary care on 05/21/2023 by Purcell Emil Schanz, MD.  Called Nurse Triage reporting No chief complaint on file..  Symptoms began today.  Interventions attempted: Nothing.  Symptoms are: stable.  Triage Disposition: No disposition on file.  Patient/caregiver understands and will follow disposition?:

## 2023-08-03 NOTE — Telephone Encounter (Unsigned)
 Copied from CRM [id number]. Topic: Clinical - Medication Refill >> Aug 03, 2023 11:49 AM Lavanda D wrote: Medication: Vitamin D , Ergocalciferol , (DRISDOL ) 1.25 MG (50000 UNIT) CAPS capsule  Has the patient contacted their pharmacy? No, said there is 0 refills (Agent: If no, request that the patient contact the pharmacy for the refill. If patient does not wish to contact the pharmacy document the reason why and proceed with request.) (Agent: If yes, when and what did the pharmacy advise?)  This is the patient's preferred pharmacy:  [id number] [place], [place] - [address] AT [facility] OF [facility] & [address] [place] [address] Phone: [phone] Fax: [phone]  Is this the correct pharmacy for this prescription? Yes If no, delete pharmacy and type the correct one.   Has the prescription been filled recently? No  Is the patient out of the medication? No  Has the patient been seen for an appointment in the last year OR does the patient have an upcoming appointment? Yes  Can we respond through MyChart? Yes  Agent: Please be advised that Rx refills may take up to 3 business days. We ask that you follow-up with your pharmacy.

## 2023-08-03 NOTE — Progress Notes (Signed)
 Office Visit Note   Patient: April Bowman           Date of Birth: 02/11/1955           MRN: [id number] Visit Date: 08/03/2023              Requested by: Purcell Emil Schanz, MD [address] [place],  [place] [address] PCP: Purcell Emil Schanz, MD   Assessment & Plan: Visit Diagnoses:  1. Age-related osteoporosis without current pathological fracture     Plan: Patient is a pleasant 68 year old woman who was referred today by Dr. Sagardia for evaluation of osteoporosis.  She has never taken osteoporosis medications before.  She has had bilateral hip replacements but they were for osteoarthritis.  She has no cardiac history no cancer history no kidney disease no history of an ulcer she does not have severe reflux.  She did go through menopause at 63 and did not have hormone replacement therapy.  She is currently not taking any calcium  be.  Most recent calcium  was within normal.  She did have a vitamin D  done by her primary care provider which demonstrated vitamin D  deficiency.  She has been on a once a week supplement 50,000 units once a week.  She says she has been taking this for a couple months she does not recall that she has had her vitamin D  recheck rechecked.  We will do this before today as I do have a concern of vitamin D  toxicity if she is on this too long.  She is not a smoker she averages 6 beers a week.  She is doing a variety of strengthening exercises almost every day.  She did get dental implants without complication last year.  She does not have any family history of fragility fractures.  45 minutes were spent reviewing her chart discussing osteoporosis treatment both lifestyle and medications with her.  She has not had a bone density scan so I would advocate this first to see where she is at.  If she is normal could just recheck another bone density in 2 years.  If she comes back with osteopenia depending on how much and osteoporosis could discuss treatments with  her.  Follow-Up Instructions: Will call after bone density  Orders:  No orders of the defined types were placed in this encounter.  No orders of the defined types were placed in this encounter.     Procedures: No procedures performed   Clinical Data: No additional findings.   Subjective: No chief complaint on file.   HPI patient is a pleasant 68 year old woman who is referred for evaluation of osteoporosis of note she did have her height taken today and she has lost about 2 to 3 inches of height according to her  Review of Systems  All other systems reviewed and are negative.    Objective: Vital Signs: There were no vitals taken for this visit.  Physical Exam Constitutional:      Appearance: Normal appearance. She is obese.  Pulmonary:     Effort: Pulmonary effort is normal.  Skin:    General: Skin is warm and dry.  Neurological:     General: No focal deficit present.     Mental Status: She is alert and oriented to person, place, and time.  Psychiatric:        Mood and Affect: Mood normal.       Specialty Comments:  No specialty comments available.  Imaging: No results found.   PMFS  History: Patient Active Problem List   Diagnosis Date Noted   Abdominal discomfort 05/21/2023   Aortic atherosclerosis (HCC) 03/18/2023   Urinary incontinence 03/18/2023   Age-related osteoporosis without current pathological fracture 02/18/2023   Injury of left radial nerve 11/06/2022   Posterior interosseous nerve injury, left, subsequent encounter 07/24/2022   Acute bilateral low back pain without sciatica 04/30/2022   Cervical cancer screening 08/26/2021   Vitamin D  deficiency 05/23/2021   Vaginal dryness 05/23/2021   Osteoarthritis 05/23/2021   Mixed stress and urge urinary incontinence 05/23/2021   Insomnia 06/02/2017   OSA (obstructive sleep apnea) 06/02/2017   Lumbar spondylosis 03/24/2016   DDD (degenerative disc disease), lumbar 03/24/2016   Chronic  right-sided low back pain with right-sided sciatica 01/23/2016   Status post right hip replacement 10/11/2015   Lipoma 11/01/2014   Carpal tunnel syndrome, bilateral 11/01/2014   S/P hip replacement 05/22/2011   Past Medical History:  Diagnosis Date   Arthritis    Osteoporosis    Sleep apnea     Family History  Problem Relation Age of Onset   Breast cancer Neg Hx     Past Surgical History:  Procedure Laterality Date   APPLICATION OF WOUND VAC Left 06/04/2022   Procedure: APPLICATION OF WOUND VAC;  Surgeon: Lowery Estefana RAMAN, DO;  Location: [facility];  Service: Plastics;  Laterality: Left;   APPLICATION OF WOUND VAC Left 06/12/2022   Procedure: APPLICATION OF WOUND VAC;  Surgeon: Lowery Estefana RAMAN, DO;  Location: [facility];  Service: Plastics;  Laterality: Left;   APPLICATION OF WOUND VAC Left 07/28/2022   Procedure: APPLICATION OF WOUND VAC;  Surgeon: Lowery Estefana RAMAN, DO;  Location: [facility];  Service: Plastics;  Laterality: Left;   AUGMENTATION MAMMAPLASTY     DEBRIDEMENT AND CLOSURE WOUND Left 05/26/2022   Procedure: ARM DEBRIDEMENT WITH MYRIAD PLACEMENT AND CHANGE WOUND VAC;  Surgeon: Lowery Estefana RAMAN, DO;  Location: [facility];  Service: Plastics;  Laterality: Left;   DEBRIDEMENT AND CLOSURE WOUND Left 06/04/2022   Procedure: ARM DEBRIDEMENT WITH MYRIAD PLACEMENT, wound vac change;  Surgeon: Lowery Estefana RAMAN, DO;  Location: [facility];  Service: Plastics;  Laterality: Left;   DEBRIDEMENT AND CLOSURE WOUND Left 07/28/2022   Procedure: Left arm wound excision with myriad and skin graft placement with VAC;  Surgeon: Lowery Estefana RAMAN, DO;  Location: [facility];  Service: Plastics;  Laterality: Left;   hip replacements Bilateral    INCISION AND DRAINAGE OF WOUND Left 05/20/2022   Procedure: IRRIGATION AND DEBRIDEMENT AND WOUND VAC PLACEMENT LEFT ARM;  Surgeon: Delene Marsa HERO, MD;   Location: [facility];  Service: Orthopedics;  Laterality: Left;   MINOR IRRIGATION AND DEBRIDEMENT OF WOUND Right 05/20/2022   Procedure: IRRIGATION AND DEBRIDEMENT OF WOUND RIGHT ARM;  Surgeon: Delene Marsa HERO, MD;  Location[facility];  Service: Orthopedics;  Laterality: Right;   SKIN SPLIT GRAFT Left 07/28/2022   Procedure: SKIN GRAFT SPLIT THICKNESS;  Surgeon: Lowery Estefana RAMAN, DO;  Location: [facility];  Service: Plastics;  Laterality: Left;   Social History   Occupational History   Not on file  Tobacco Use   Smoking status: Former    Current packs/day: 0.00    Average packs/day: 0.5 packs/day for 35.0 years (17.5 ttl pk-yrs)    Types: Cigarettes    Start date: 04/03/1975    Quit date: 04/03/2010    Years since quitting: 13.3  Smokeless tobacco: Never  Vaping Use   Vaping status: Never Used  Substance and Sexual Activity   Alcohol use: Not Currently   Drug use: Never   Sexual activity: Yes    Birth control/protection: None

## 2023-08-03 NOTE — Addendum Note (Signed)
 Addended by: RODGERS LACY on: 08/03/2023 10:41 AM   Modules accepted: Orders

## 2023-08-03 NOTE — Addendum Note (Signed)
 Addended by: RODGERS LACY on: 08/03/2023 10:42 AM   Modules accepted: Orders

## 2023-08-24 ENCOUNTER — Telehealth (HOSPITAL_COMMUNITY): Admitting: Psychiatry

## 2023-08-24 ENCOUNTER — Encounter (HOSPITAL_COMMUNITY): Payer: Self-pay | Admitting: Psychiatry

## 2023-08-24 DIAGNOSIS — F5101 Primary insomnia: Secondary | ICD-10-CM

## 2023-08-24 MED ORDER — ZOLPIDEM TARTRATE 10 MG PO TABS: 10.0000 mg | ORAL_TABLET | Freq: Every day | ORAL | 1 refills | Status: DC

## 2023-08-24 NOTE — Progress Notes (Signed)
  Health MD Virtual Progress Note   Patient Location: Home Provider Location: Home Office  I connect with patient by video and verified that I am speaking with correct person by using two identifiers. I discussed the limitations of evaluation and management by telemedicine and the availability of in person appointments. I also discussed with the patient that there may be a patient responsible charge related to this service. The patient expressed understanding and agreed to proceed.  [name] [id number] 68 y.o.  08/24/2023 1:09 PM  History of Present Illness:  Patient is evaluated by video session.  She is taking Ambien  every night which is helping her sleep.  She left the medication because she is able to get at least 6 or 7 hours sleep without any major problem.  She still has not heard from sleep doctor.  She is no longer taking doxepin .  She denies any depressive thoughts, crying spells or any feeling of hopelessness or worthlessness.  She is seeing orthopedic for her left arm and chronic pain.  She still uses CPAP but does not feel it is working well and she like to have visit with sleep doctor.  We have sent the message to the sleep doctor but patient still had not heard from them.  Her appetite is okay.  She denies any symptoms of depression.  Past Psychiatric History: None reported.  However prescribed nortriptyline, mirtazapine, trazodone , hydroxyzine , Xanax and Seroquel  from her primary care.  Doxepin  did not work.  Past Medical History:  Diagnosis Date   Arthritis    Osteoporosis    Sleep apnea     Outpatient Encounter Medications as of 08/24/2023  Medication Sig   acetaminophen  (TYLENOL ) 500 MG tablet Take 2 tablets (1,000 mg total) by mouth every 6 (six) hours as needed for mild pain or fever.   ASCORBIC ACID PO Take 1 tablet by mouth daily. Vitamin C, unknown strength.   Cholecalciferol  (VITAMIN D -3 PO) Take 1 capsule by mouth daily.   Cyanocobalamin   (VITAMIN B-12 PO) Take 1 tablet by mouth daily.   diphenhydrAMINE  (BENADRYL ) 25 MG tablet Take 1 tablet (25 mg total) by mouth every 8 (eight) hours as needed for up to 5 days.   estradiol  (ESTRACE  VAGINAL) 0.1 MG/GM vaginal cream Place 1 g vaginally at bedtime.   gabapentin  (NEURONTIN ) 300 MG capsule Take 1 capsule (300 mg total) by mouth 3 (three) times daily.   MAGNESIUM  OXIDE PO Take 1 tablet by mouth daily.   mirabegron ER (MYRBETRIQ) 50 MG TB24 tablet Take 1 tablet by mouth daily.   MORINGA OLEIFERA PO Take 1 capsule by mouth daily.   naproxen  (NAPROSYN ) 500 MG tablet Take 1 tablet (500 mg total) by mouth 2 (two) times daily as needed.   Omega-3 Fatty Acids (OMEGA 3 500 PO) Take 500 mg by mouth daily at 2 am.   POTASSIUM GLUCONATE PO Take 1 tablet by mouth daily.   rosuvastatin  (CRESTOR ) 10 MG tablet Take 1 tablet (10 mg total) by mouth daily.   Vitamin D , Ergocalciferol , (DRISDOL ) 1.25 MG (50000 UNIT) CAPS capsule TAKE 1 CAPSULE BY MOUTH EVERY 7 DAYS   VITAMIN E PO Take 1 capsule by mouth daily.   zolpidem  (AMBIEN ) 10 MG tablet Take 1 tablet (10 mg total) by mouth at bedtime.   No facility-administered encounter medications on file as of 08/24/2023.    Recent Results (from the past 2160 hours)  VITAMIN D  25 Hydroxy (Vit-D Deficiency, Fractures)     Status: None  Collection Time: 08/03/23 12:00 PM  Result Value Ref Range   Vit D, 25-Hydroxy 39 30 - 100 ng/mL    Comment: Vitamin D  Status         25-OH Vitamin D : . Deficiency:                    <20 ng/mL Insufficiency:             20 - 29 ng/mL Optimal:                 > or = 30 ng/mL . For 25-OH Vitamin D  testing on patients on  D2-supplementation and patients for whom quantitation  of D2 and D3 fractions is required, the [facility]ureD(TM) 25-OH VIT D, (D2,D3), LC/MS/MS is recommended: order  code 07111 (patients >72yrs). . See Note 1 . Note 1 . For additional information, please refer to   http://education.[facility].com/faq/FAQ199  (This link is being provided for informational/ educational purposes only.)      Psychiatric Specialty Exam: Physical Exam  Review of Systems  Neurological:  Positive for numbness.    Weight 155 lb (70.3 kg).There is no height or weight on file to calculate BMI.  General Appearance: Casual  Eye Contact:  Good  Speech:  Slow  Volume:  Normal  Mood:  Euthymic  Affect:  Congruent  Thought Process:  Goal Directed  Orientation:  Full (Time, Place, and Person)  Thought Content:  WDL  Suicidal Thoughts:  No  Homicidal Thoughts:  No  Memory:  Immediate;   Good Recent;   Fair Remote;   Fair  Judgement:  Intact  Insight:  Present  Psychomotor Activity:  Normal  Concentration:  Concentration: Fair and Attention Span: Fair  Recall:  Fair  Fund of Knowledge:  Good  Language:  Fair  Akathisia:  No  Handed:  Right  AIMS (if indicated):     Assets:  Communication Skills Desire for Improvement Housing Social Support Talents/Skills Transportation  ADL's:  Intact  Cognition:  WNL  Sleep:  6 hrs       05/21/2023   11:14 AM 05/21/2023   11:05 AM 05/18/2023    3:12 PM 03/18/2023   10:39 AM  Depression screen PHQ 2/9  Decreased Interest 0 0 0 0  Down, Depressed, Hopeless 0 3 3 0  PHQ - 2 Score 0 3 3 0  Altered sleeping 3 3 3    Tired, decreased energy 2 1 1    Change in appetite 0 0 0   Feeling bad or failure about yourself  0 0 0   Trouble concentrating 0 0 0   Moving slowly or fidgety/restless 0 0 0   Suicidal thoughts 0 0 0   PHQ-9 Score 5 7 7    Difficult doing work/chores  Not difficult at all Not difficult at all     Assessment/Plan: Primary insomnia - Plan: zolpidem  (AMBIEN ) 10 MG tablet  Patient doing better with Ambien .  She is taking every night but is helping her sleep.  Discussed hypnotics abuse, dependency, tolerance and withdrawal.  Recommend not to drink alcohol or any other illegal substances.  Will follow the  note 1 more time to her PCP and sleep physician.  Recommend to call back if she has any question or any concern.  Follow-up in 3 months.  Patient not interested in therapy.   Follow Up Instructions:     I discussed the assessment and treatment plan with the patient. The patient was provided an opportunity to  ask questions and all were answered. The patient agreed with the plan and demonstrated an understanding of the instructions.   The patient was advised to call back or seek an in-person evaluation if the symptoms worsen or if the condition fails to improve as anticipated.    Collaboration of Care: Other provider involved in patient's care AEB notes are available in epic to review  Patient/Guardian was advised Release of Information must be obtained prior to any record release in order to collaborate their care with an outside provider. Patient/Guardian was advised if they have not already done so to contact the registration department to sign all necessary forms in order for us  to release information regarding their care.   Consent: Patient/Guardian gives verbal consent for treatment and assignment of benefits for services provided during this visit. Patient/Guardian expressed understanding and agreed to proceed.     Total encounter time 17 minutes which includes face-to-face time, chart reviewed, care coordination, order entry and documentation during this encounter.   Note: This document was prepared by [employer] voice dictation technology and any errors that results from this process are unintentional.    Leni ONEIDA Client, MD 08/24/2023

## 2023-08-31 ENCOUNTER — Telehealth: Payer: Self-pay

## 2023-08-31 DIAGNOSIS — S52602B Unspecified fracture of lower end of left ulna, initial encounter for open fracture type I or II: Secondary | ICD-10-CM | POA: Diagnosis not present

## 2023-08-31 DIAGNOSIS — G8929 Other chronic pain: Secondary | ICD-10-CM | POA: Diagnosis not present

## 2023-08-31 DIAGNOSIS — S52609B Unspecified fracture of lower end of unspecified ulna, initial encounter for open fracture type I or II: Secondary | ICD-10-CM | POA: Diagnosis not present

## 2023-08-31 DIAGNOSIS — S4422XD Injury of radial nerve at upper arm level, left arm, subsequent encounter: Secondary | ICD-10-CM | POA: Diagnosis not present

## 2023-08-31 DIAGNOSIS — M25532 Pain in left wrist: Secondary | ICD-10-CM | POA: Diagnosis not present

## 2023-08-31 NOTE — Telephone Encounter (Signed)
 Called patient April Bowman 2.  Spoke with patient.  Patient states no one has called her to set up a home sleep test.  Will check with [facility] team.  Patient will need an interpreter to review details as patient speaks [place].  She does speak some [place] but mostly [place].  [facility] team:  please advise.  Please check on order for patient to get a home sleep test that was ordered on 06/22/2023.  Please get an interpreter to help patient understand information Thank you!

## 2023-08-31 NOTE — Telephone Encounter (Addendum)
 Per Dr. Saundra note on 08/31/2023: April Reggy BIRCH, MD  Curry Leni DASEN, MD; Purcell Emil Schanz, MD; Issac Greig HERO, CMA Gracelee Stemmler- Please check status. We saw this patient in June. We ordered a home sleep test and she was to call for results of that 2 weeks later. She did not make a f/u appointment here. Has that sleep study order actually gone through, and has a date been set?? Thanks-CDY.  Left message on patients VM for patient to call clinic to ask if sleep study has been done.  Sleep test was ordered on 06/22/2023.

## 2023-09-01 NOTE — Telephone Encounter (Signed)
 Copied from CRM #[id number]. Topic: Clinical - Medical Advice >> Aug 31, 2023  3:53 PM April Bowman wrote: Reason for CRM: Patient states she has not done a sleep study and nobody has called her to schedule one. Patient requesting to have this done.

## 2023-09-02 NOTE — Telephone Encounter (Signed)
 It seems like [facility] has been attempting to contact the patient but have not been successful. I'll give the patient a call and let them know.

## 2023-09-02 NOTE — Telephone Encounter (Signed)
 I left a voicemail for the patient with a callback number to my direct-line.

## 2023-09-07 ENCOUNTER — Telehealth: Payer: Self-pay

## 2023-09-07 NOTE — Telephone Encounter (Signed)
 Copied from CRM #[id number]. Topic: Referral - Question >> Sep 07, 2023 11:48 AM Anairis L wrote: Reason for CRM: Pt is calling because she seen the osteoporosis specialist and had one exam done and never received a call back.  They claimed she needed a injection but never followed up.

## 2023-09-07 NOTE — Telephone Encounter (Signed)
 Copied from CRM [id number]. Topic: Referral - Request for Referral >> Sep 07, 2023 11:44 AM Anairis L wrote: Did the patient discuss referral with their provider in the last year? Yes (If No - schedule appointment) (If Yes - send message)  Appointment offered? No  Type of order/referral and detailed reason for visit: Urine and stool Incontinence  Preference of office, provider, location: [facility]   If referral order, have you been seen by this specialty before? Yes, years ago  (If Yes, this issue or another issue? When? Where?  Can we respond through MyChart? Yes

## 2023-09-08 ENCOUNTER — Other Ambulatory Visit: Payer: Self-pay | Admitting: Radiology

## 2023-09-08 DIAGNOSIS — R159 Full incontinence of feces: Secondary | ICD-10-CM

## 2023-09-08 NOTE — Telephone Encounter (Signed)
 Stool incontinence is referral to GI.  Provide referral as requested.

## 2023-09-10 ENCOUNTER — Ambulatory Visit (INDEPENDENT_AMBULATORY_CARE_PROVIDER_SITE_OTHER): Admitting: Emergency Medicine

## 2023-09-10 ENCOUNTER — Encounter: Payer: Self-pay | Admitting: Emergency Medicine

## 2023-09-10 VITALS — BP 110/72 | HR 73 | Temp 98.1°F | Ht 61.0 in | Wt 153.0 lb

## 2023-09-10 DIAGNOSIS — R399 Unspecified symptoms and signs involving the genitourinary system: Secondary | ICD-10-CM

## 2023-09-10 DIAGNOSIS — R5383 Other fatigue: Secondary | ICD-10-CM | POA: Diagnosis not present

## 2023-09-10 DIAGNOSIS — G4733 Obstructive sleep apnea (adult) (pediatric): Secondary | ICD-10-CM

## 2023-09-10 DIAGNOSIS — I700 Atherosclerosis of aorta: Secondary | ICD-10-CM

## 2023-09-10 DIAGNOSIS — M810 Age-related osteoporosis without current pathological fracture: Secondary | ICD-10-CM

## 2023-09-10 DIAGNOSIS — R300 Dysuria: Secondary | ICD-10-CM

## 2023-09-10 LAB — CBC WITH DIFFERENTIAL/PLATELET
Basophils Absolute: 0 K/uL (ref 0.0–0.1)
Basophils Relative: 0.5 % (ref 0.0–3.0)
Eosinophils Absolute: 0.1 K/uL (ref 0.0–0.7)
Eosinophils Relative: 1.5 % (ref 0.0–5.0)
HCT: 42.7 % (ref 36.0–46.0)
Hemoglobin: 14.2 g/dL (ref 12.0–15.0)
Lymphocytes Relative: 28.5 % (ref 12.0–46.0)
Lymphs Abs: 2.6 K/uL (ref 0.7–4.0)
MCHC: 33.3 g/dL (ref 30.0–36.0)
MCV: 91.1 fl (ref 78.0–100.0)
Monocytes Absolute: 0.7 K/uL (ref 0.1–1.0)
Monocytes Relative: 7.6 % (ref 3.0–12.0)
Neutro Abs: 5.6 K/uL (ref 1.4–7.7)
Neutrophils Relative %: 61.9 % (ref 43.0–77.0)
Platelets: 343 K/uL (ref 150.0–400.0)
RBC: 4.68 Mil/uL (ref 3.87–5.11)
RDW: 13.3 % (ref 11.5–15.5)
WBC: 9 K/uL (ref 4.0–10.5)

## 2023-09-10 LAB — COMPREHENSIVE METABOLIC PANEL WITH GFR
ALT: 10 U/L (ref 0–35)
AST: 15 U/L (ref 0–37)
Albumin: 4.1 g/dL (ref 3.5–5.2)
Alkaline Phosphatase: 86 U/L (ref 39–117)
BUN: 19 mg/dL (ref 6–23)
CO2: 27 meq/L (ref 19–32)
Calcium: 8.9 mg/dL (ref 8.4–10.5)
Chloride: 99 meq/L (ref 96–112)
Creatinine, Ser: 0.7 mg/dL (ref 0.40–1.20)
GFR: 89.53 mL/min (ref >60.00)
Glucose, Bld: 92 mg/dL (ref 70–99)
Potassium: 4.4 meq/L (ref 3.5–5.1)
Sodium: 136 meq/L (ref 135–145)
Total Bilirubin: 0.5 mg/dL (ref 0.2–1.2)
Total Protein: 7.4 g/dL (ref 6.0–8.3)

## 2023-09-10 LAB — TSH: TSH: 1.12 u[IU]/mL (ref 0.35–5.50)

## 2023-09-10 LAB — LIPID PANEL
Cholesterol: 212 mg/dL — ABNORMAL HIGH (ref 0–200)
HDL: 63.1 mg/dL (ref >39.00)
LDL Cholesterol: 109 mg/dL — ABNORMAL HIGH (ref 0–99)
NonHDL: 149.1
Total CHOL/HDL Ratio: 3
Triglycerides: 200 mg/dL — ABNORMAL HIGH (ref 0.0–149.0)
VLDL: 40 mg/dL (ref 0.0–40.0)

## 2023-09-10 LAB — HEMOGLOBIN A1C: Hgb A1c MFr Bld: 6.5 % (ref 4.6–6.5)

## 2023-09-10 NOTE — Assessment & Plan Note (Signed)
 Normal urinalysis.  No signs of infection. Symptoms for over a month Urine sent for culture

## 2023-09-10 NOTE — Assessment & Plan Note (Signed)
 Clinically stable.  No red flag signs or symptoms. Most recent blood work results from last May discussed with patient. Low vitamin D  levels which improved with supplementation Elevated cholesterol History of obstructive sleep apnea but has been using CPAP for over 5 years Recent symptoms 64 month old Not overly stressed. Differential diagnosis discussed Recommend blood work today We will follow-up after that

## 2023-09-10 NOTE — Patient Instructions (Signed)
 Fatiga Fatigue Si siente fatiga, tiene una sensacin de cansancio en todo momento y falta de energa o falta de motivacin. La fatiga puede dificultar el inicio o la realizacin de tareas debido al agotamiento. La fatiga ocasional o leve con frecuencia es una reaccin normal a la actividad o la vida. Sin embargo, la fatiga de Set designer duracin (crnica) o extrema puede ser sntoma de una afeccin mdica, por ejemplo: Depresin. No tener suficientes glbulos rojos o hemoglobina en la sangre (anemia). Un problema con Neomia Dear glndula pequea ubicada en la parte inferior delantera del cuello (trastorno tiroideo). Afecciones reumatolgicas. Estos son problemas relacionados con el sistema de defensa del cuerpo (sistema inmunitario). Infecciones, especialmente ciertas infecciones virales. Con el transcurso del [place], la fatiga tambin puede tener consecuencias negativas en la salud. Siga estas instrucciones en su casa: Medicamentos Use los medicamentos de venta libre y los recetados solamente como se lo haya indicado el mdico. Tome una multivitamina, si se lo indic el mdico. No tome ningn complemento alimenticio o a base de hierbas a menos que lo haya autorizado el mdico. Comida y bebida  Evite las comidas abundantes a la noche. Consuma una dieta bien balanceada que incluya protenas magras, cereales integrales, abundantes frutas, verduras y productos lcteos descremados. Evite comer o beber demasiados productos que contengan cafena. Evite tomar alcohol. Beber suficiente lquido como para Pharmacologist la orina de color amarillo plido. Actividad  Realice ejercicio con regularidad como se lo haya indicado el mdico. Practique tcnicas que lo ayuden a Lexicographer, como yoga, tai chi, meditacin, masoterapia. Estilo de [employer] las situaciones que le provocan estrs. Trate de que sus horarios de [place] y personal estn equilibrados. No use drogas ilegales ni recreativas. Instrucciones  generales Controle su fatiga para ver si hay cambios. Acustese y levntese a la misma hora todos [place]. Evite la fatiga moderando su ritmo durante el da y durmiendo lo suficiente durante la noche. Mantenga un peso saludable. Comunquese con un mdico si: La fatiga no mejora. Tiene fiebre. Pierde peso o [place] de peso de forma repentina. Tiene dolores de [place]. Tiene problemas para conciliar el sueo o para dormir en la noche. Se siente enojado, culpable, ansioso o triste. Observa hinchazn en las piernas u otras partes del cuerpo. Solicite ayuda de inmediato si: Se siente confundido, siente que va a desmayarse o se desmaya. Tiene la visin borrosa o tiene dolor de cabeza intenso. Siente dolor intenso en el abdomen, la espalda o el rea entre la cintura y las caderas (pelvis). Tiene dolor de pecho, dificultad para respirar, o latidos cardacos irregulares o acelerados. No puede orinar u [employer] de lo normal. Presenta sangrado anormal del recto, la nariz, los pulmones, los pezones o, si es [place], de la vagina. Vomita sangre. Tiene pensamientos acerca de Radiographer, therapeutic a Economist. Estos sntomas pueden Customer service manager. Solicite ayuda de inmediato. Llame al 911. No espere a ver si los sntomas desaparecen. No conduzca por sus propios medios [employer]. Busque ayuda de inmediato si alguna vez siente que puede hacerse dao a usted mismo o a otros, o tiene pensamientos de Patent examiner a su vida. Dirjase al centro de urgencias ms cercano o: Llame al 911. Llame a National Suicide Prevention Lifeline (Lnea Telefnica Nacional para la Prevencin del Suicidio) al [phone] o al 988. Est disponible las 24 horas del da. Enve un mensaje de texto a la lnea para casos de crisis al [phone]. Resumen Si siente fatiga, tiene una sensacin de cansancio en todo  momento y falta de energa o falta de motivacin. La fatiga puede dificultar el inicio o la realizacin  de tareas debido al agotamiento. La fatiga de larga duracin (crnica) o extrema puede ser sntoma de una afeccin mdica. Realice ejercicio con regularidad como se lo haya indicado el mdico. Cambie las situaciones que le provocan estrs. Trate de que sus horarios de [place] y personal estn equilibrados. Esta informacin no tiene Theme park manager el consejo del mdico. Asegrese de hacerle al mdico cualquier pregunta que tenga. Document Revised: 11/08/2020 Document Reviewed: 11/08/2020 Elsevier Patient Education  2024 [employer].

## 2023-09-10 NOTE — Assessment & Plan Note (Signed)
 Recently diagnosed with osteoporosis and told she needs treatment We will place referral to osteoporosis management clinic

## 2023-09-10 NOTE — Assessment & Plan Note (Signed)
 Normal urinalysis.  Will send urine for culture Needs urology evaluation.  Referral placed today.

## 2023-09-10 NOTE — Telephone Encounter (Signed)
 Patient had in office appt and this was addressed with pcp

## 2023-09-10 NOTE — Assessment & Plan Note (Signed)
 Diet and nutrition discussed Continue rosuvastatin  10 mg daily The 10-year ASCVD risk score (Arnett DK, et al., 2019) is: 5.2%   Values used to calculate the score:     Age: 68 years     Clincally relevant sex: Female     Is Non-Hispanic African American: No     Diabetic: No     Tobacco smoker: No     Systolic Blood Pressure: 110 mmHg     Is BP treated: No     HDL Cholesterol: 68.6 mg/dL     Total Cholesterol: 242 mg/dL

## 2023-09-10 NOTE — Assessment & Plan Note (Signed)
 Recent pulmonary office visit assessment and plan: Benefits from CPAP but appropriate now to reassess Plan- home sleep test. Possibly OAP as alternative to CPAP

## 2023-09-10 NOTE — Progress Notes (Unsigned)
 [name] 68 y.o.   Chief Complaint  Patient presents with   Dysuria    Patient here for burning when she urinates. States this has been going on for about month. She is feeling pressure also. She I not taking any medication for her symptoms      HISTORY OF PRESENT ILLNESS: This is a 68 y.o. female with 2 concerns today: 1.  Lower urinary tract symptoms for 1 month.  Intermittent burning.  Unable to fully empty bladder.  Occasional bladder pressure 2.  Wakes up tired and stays tired during the day.  For 1 month.  Has history of obstructive sleep apnea on CPAP treatment for over 5 years 3.  History of osteoporosis.  Needs referral No other complaints or medical concerns today.  Dysuria  Pertinent negatives include no chills, flank pain, hematuria, nausea or vomiting.     Prior to Admission medications   Medication Sig Start Date End Date Taking? Authorizing Provider  acetaminophen  (TYLENOL ) 500 MG tablet Take 2 tablets (1,000 mg total) by mouth every 6 (six) hours as needed for mild pain or fever. 05/27/22  Yes Vicci Sor R, PA-C  ASCORBIC ACID PO Take 1 tablet by mouth daily. Vitamin C, unknown strength.   Yes [provider]  Cholecalciferol  (VITAMIN D -3 PO) Take 1 capsule by mouth daily.   Yes [provider]  Cyanocobalamin  (VITAMIN B-12 PO) Take 1 tablet by mouth daily.   Yes [provider]  diphenhydrAMINE  (BENADRYL ) 25 MG tablet Take 1 tablet (25 mg total) by mouth every 8 (eight) hours as needed for up to 5 days. 09/03/22 09/10/23 Yes Waddell Sluder, PA-C  estradiol  (ESTRACE  VAGINAL) 0.1 MG/GM vaginal cream Place 1 g vaginally at bedtime. 07/10/23  Yes Lainey Nelson, Emil Schanz, MD  gabapentin  (NEURONTIN ) 300 MG capsule Take 1 capsule (300 mg total) by mouth 3 (three) times daily. 07/10/23  Yes Antha Niday, Emil Schanz, MD  MAGNESIUM  OXIDE PO Take 1 tablet by mouth daily.   Yes [provider]  mirabegron ER (MYRBETRIQ) 50 MG TB24 tablet Take 1  tablet by mouth daily. 03/14/21  Yes [provider]  MORINGA OLEIFERA PO Take 1 capsule by mouth daily.   Yes [provider]  naproxen  (NAPROSYN ) 500 MG tablet Take 1 tablet (500 mg total) by mouth 2 (two) times daily as needed. 07/10/23  Yes Linda Biehn, Emil Schanz, MD  Omega-3 Fatty Acids (OMEGA 3 500 PO) Take 500 mg by mouth daily at 2 am.   Yes [provider]  POTASSIUM GLUCONATE PO Take 1 tablet by mouth daily.   Yes [provider]  QUEtiapine  (SEROQUEL ) 100 MG tablet Take 100 mg by mouth at bedtime. 07/29/23  Yes [provider]  rosuvastatin  (CRESTOR ) 10 MG tablet Take 1 tablet (10 mg total) by mouth daily. 05/21/23  Yes Taiwan Millon, Emil Schanz, MD  Vitamin D , Ergocalciferol , (DRISDOL ) 1.25 MG (50000 UNIT) CAPS capsule TAKE 1 CAPSULE BY MOUTH EVERY 7 DAYS 08/01/23  Yes Linnea Todisco Jose, MD  VITAMIN E PO Take 1 capsule by mouth daily.   Yes [provider]  zolpidem  (AMBIEN ) 10 MG tablet Take 1 tablet (10 mg total) by mouth at bedtime. 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/06/2022   Posterior interosseous nerve injury, left, subsequent encounter 07/24/2022   Acute bilateral low back pain without sciatica 04/30/2022   Cervical cancer screening 08/26/2021   Vitamin D  deficiency 05/23/2021   Vaginal dryness 05/23/2021   Osteoarthritis 05/23/2021   Mixed stress and urge urinary incontinence 05/23/2021   Insomnia 06/02/2017   OSA (obstructive sleep apnea) 06/02/2017   Lumbar spondylosis 03/24/2016   DDD (degenerative disc disease), lumbar 03/24/2016   Chronic right-sided low back pain with right-sided sciatica 01/23/2016   Status post right  hip replacement 10/11/2015   Lipoma 11/01/2014   Carpal tunnel syndrome, bilateral 11/01/2014   S/P hip replacement 05/22/2011    Past Medical History:  Diagnosis Date   Arthritis    Osteoporosis    Sleep apnea     Past Surgical History:  Procedure Laterality Date   APPLICATION OF WOUND VAC Left 06/04/2022   Procedure: APPLICATION OF WOUND VAC;  Surgeon: Lowery Estefana RAMAN, DO;  Location: [facility];  Service: Plastics;  Laterality: Left;   APPLICATION OF WOUND VAC Left 06/12/2022   Procedure: APPLICATION OF WOUND VAC;  Surgeon: Lowery Estefana RAMAN, DO;  Location: [facility];  Service: Plastics;  Laterality: Left;   APPLICATION OF WOUND VAC Left 07/28/2022   Procedure: APPLICATION OF WOUND VAC;  Surgeon: Lowery Estefana RAMAN, DO;  Location: [facility];  Service: Plastics;  Laterality: Left;   AUGMENTATION MAMMAPLASTY     DEBRIDEMENT AND CLOSURE WOUND Left 05/26/2022   Procedure: ARM DEBRIDEMENT WITH MYRIAD PLACEMENT AND CHANGE WOUND VAC;  Surgeon: Lowery Estefana RAMAN, DO;  Location: [facility];  Service: Plastics;  Laterality: Left;   DEBRIDEMENT AND CLOSURE WOUND Left 06/04/2022   Procedure: ARM DEBRIDEMENT WITH MYRIAD PLACEMENT, wound vac change;  Surgeon: Lowery Estefana RAMAN, DO;  Location: [facility];  Service: Plastics;  Laterality: Left;   DEBRIDEMENT AND CLOSURE WOUND Left 07/28/2022   Procedure: Left arm wound excision with myriad and skin graft placement with VAC;  Surgeon: Lowery Estefana RAMAN, DO;  Location: [facility];  Service: Plastics;  Laterality: Left;   hip replacements Bilateral    INCISION AND DRAINAGE OF WOUND Left 05/20/2022   Procedure: IRRIGATION AND DEBRIDEMENT AND WOUND VAC PLACEMENT LEFT ARM;  Surgeon: Delene Marsa HERO, MD;  Location: [facility];  Service: Orthopedics;  Laterality: Left;   MINOR IRRIGATION AND DEBRIDEMENT OF WOUND Right 05/20/2022   Procedure: IRRIGATION AND DEBRIDEMENT OF  WOUND RIGHT ARM;  Surgeon: Delene Marsa HERO, MD;  Location: [facility];  Service: Orthopedics;  Laterality: Right;   SKIN SPLIT GRAFT Left 07/28/2022   Procedure: SKIN GRAFT SPLIT THICKNESS;  Surgeon: Lowery Estefana RAMAN, DO;  Location: [facility];  Service: Plastics;  Laterality: Left;    Social History   Socioeconomic History   Marital status: Married    Spouse name: Marcus Baller   Number of children: Not on file   Years of education: Not on file   Highest education level: 7th grade  Occupational History   Not on file  Tobacco Use   Smoking status: Former    Current packs/day: 0.00    Average packs/day: 0.5 packs/day for 35.0 years (17.5 ttl pk-yrs)    Types: Cigarettes    Start date: 04/03/1975    Quit date: 04/03/2010    Years since quitting: 13.4   Smokeless tobacco: Never  Vaping Use   Vaping status: Never Used  Substance and Sexual Activity  Alcohol use: Not Currently   Drug use: Never   Sexual activity: Yes    Birth control/protection: None  Other Topics Concern   Not on file  Social History Narrative   Lives with husband   Social Drivers of Health   Financial Resource Strain: Medium Risk (05/18/2023)   Overall Financial Resource Strain (CARDIA)    Difficulty of Paying Living Expenses: Somewhat hard  Food Insecurity: No Food Insecurity (05/18/2023)   Hunger Vital Sign    Worried About Running Out of Food in the Last Year: Never true    Ran Out of Food in the Last Year: Never true  Transportation Needs: No Transportation Needs (05/18/2023)   PRAPARE - Administrator, Civil Service (Medical): No    Lack of Transportation (Non-Medical): No  Physical Activity: Insufficiently Active (05/18/2023)   Exercise Vital Sign    Days of Exercise per Week: 4 days    Minutes of Exercise per Session: 30 min  Stress: No Stress Concern Present (05/18/2023)   [employer] of Occupational Health - Occupational Stress Questionnaire    Feeling of Stress :  Not at all  Social Connections: Moderately Isolated (05/18/2023)   Social Connection and Isolation Panel    Frequency of Communication with Friends and Family: More than three times a week    Frequency of Social Gatherings with Friends and Family: More than three times a week    Attends Religious Services: Never    Database administrator or Organizations: No    Attends Banker Meetings: Never    Marital Status: Married  Catering manager Violence: Not At Risk (05/18/2023)   Humiliation, Afraid, Rape, and Kick questionnaire    Fear of Current or Ex-Partner: No    Emotionally Abused: No    Physically Abused: No    Sexually Abused: No    Family History  Problem Relation Age of Onset   Breast cancer Neg Hx      Review of Systems  Constitutional:  Positive for malaise/fatigue. Negative for chills and fever.  HENT: Negative.  Negative for congestion and sore throat.   Respiratory: Negative.  Negative for cough and shortness of breath.   Cardiovascular: Negative.  Negative for chest pain and palpitations.  Gastrointestinal:  Negative for abdominal pain, diarrhea, nausea and vomiting.  Genitourinary:  Positive for dysuria. Negative for flank pain and hematuria.  Musculoskeletal: Negative.   Skin: Negative.  Negative for rash.  Neurological:  Negative for dizziness and headaches.  All other systems reviewed and are negative.   Vitals:   09/10/23 1317  BP: 110/72  Pulse: 73  Temp: 98.1 F (36.7 C)  SpO2: 93%    Physical Exam Vitals reviewed.  Constitutional:      Appearance: Normal appearance.  HENT:     Head: Normocephalic.     Mouth/Throat:     Mouth: Mucous membranes are moist.     Pharynx: Oropharynx is clear.  Eyes:     Extraocular Movements: Extraocular movements intact.     Pupils: Pupils are equal, round, and reactive to light.  Cardiovascular:     Rate and Rhythm: Normal rate and regular rhythm.     Pulses: Normal pulses.     Heart sounds: Normal  heart sounds.  Pulmonary:     Effort: Pulmonary effort is normal.     Breath sounds: Normal breath sounds.  Abdominal:     Palpations: Abdomen is soft.     Tenderness: There is no abdominal  tenderness.  Musculoskeletal:     Cervical back: No tenderness.  Lymphadenopathy:     Cervical: No cervical adenopathy.  Skin:    General: Skin is warm and dry.     Capillary Refill: Capillary refill takes less than 2 seconds.  Neurological:     General: No focal deficit present.     Mental Status: She is alert and oriented to person, place, and time.  Psychiatric:        Mood and Affect: Mood normal.        Behavior: Behavior normal.      ASSESSMENT & PLAN: Problem List Items Addressed This Visit       Cardiovascular and Mediastinum   Aortic atherosclerosis (HCC)   Diet and nutrition discussed Continue rosuvastatin  10 mg daily The 10-year ASCVD risk score (Arnett DK, et al., 2019) is: 5.2%   Values used to calculate the score:     Age: 70 years     Clincally relevant sex: Female     Is Non-Hispanic African American: No     Diabetic: No     Tobacco smoker: No     Systolic Blood Pressure: 110 mmHg     Is BP treated: No     HDL Cholesterol: 68.6 mg/dL     Total Cholesterol: 242 mg/dL         Respiratory   OSA (obstructive sleep apnea)   Recent pulmonary office visit assessment and plan: Benefits from CPAP but appropriate now to reassess Plan- home sleep test. Possibly OAP as alternative to CPAP        Musculoskeletal and Integument   Age-related osteoporosis without current pathological fracture   Recently diagnosed with osteoporosis and told she needs treatment We will place referral to osteoporosis management clinic      Relevant Orders   Amb Referral to Osteoporosis Management      Other   Dysuria   Normal urinalysis.  No signs of infection. Symptoms for over a month Urine sent for culture      Relevant Orders   POCT Urinalysis Dipstick (Automated)   Urine  Culture   Tiredness - Primary   Clinically stable.  No red flag signs or symptoms. Most recent blood work results from last May discussed with patient. Low vitamin D  levels which improved with supplementation Elevated cholesterol History of obstructive sleep apnea but has been using CPAP for over 5 years Recent symptoms 23 month old Not overly stressed. Differential diagnosis discussed Recommend blood work today We will follow-up after that      Relevant Orders   CBC with Differential/Platelet   Comprehensive metabolic panel with GFR   Hemoglobin A1c   Lipid panel   TSH   Lower urinary tract symptoms   Normal urinalysis.  Will send urine for culture Needs urology evaluation.  Referral placed today.      Relevant Orders   Ambulatory referral to Urology   Patient Instructions  Fatiga Fatigue Si siente fatiga, tiene una sensacin de cansancio en todo momento y falta de energa o falta de motivacin. La fatiga puede dificultar el inicio o la realizacin de tareas debido al agotamiento. La fatiga ocasional o leve con frecuencia es una reaccin normal a la actividad o la vida. Sin embargo, la fatiga de Set designer duracin (crnica) o extrema puede ser sntoma de una afeccin mdica, por ejemplo: Depresin. No tener suficientes glbulos rojos o hemoglobina en la sangre (anemia). Un problema con una glndula pequea ubicada en la parte inferior delantera  del cuello (trastorno tiroideo). Afecciones reumatolgicas. Estos son problemas relacionados con el sistema de defensa del cuerpo (sistema inmunitario). Infecciones, especialmente ciertas infecciones virales. Con el transcurso del [place], la fatiga tambin puede tener consecuencias negativas en la salud. Siga estas instrucciones en su casa: Medicamentos Use los medicamentos de venta libre y los recetados solamente como se lo haya indicado el mdico. Tome una multivitamina, si se lo indic el mdico. No tome ningn complemento alimenticio  o a base de hierbas a menos que lo haya autorizado el mdico. Comida y bebida  Evite las comidas abundantes a la noche. Consuma una dieta bien balanceada que incluya protenas magras, cereales integrales, abundantes frutas, verduras y productos lcteos descremados. Evite comer o beber demasiados productos que contengan cafena. Evite tomar alcohol. Beber suficiente lquido como para Pharmacologist la orina de color amarillo plido. Actividad  Realice ejercicio con regularidad como se lo haya indicado el mdico. Practique tcnicas que lo ayuden a Lexicographer, como yoga, tai chi, meditacin, masoterapia. Estilo de [employer] las situaciones que le provocan estrs. Trate de que sus horarios de [place] y personal estn equilibrados. No use drogas ilegales ni recreativas. Instrucciones generales Controle su fatiga para ver si hay cambios. Acustese y levntese a la misma hora todos [place]. Evite la fatiga moderando su ritmo durante el da y durmiendo lo suficiente durante la noche. Mantenga un peso saludable. Comunquese con un mdico si: La fatiga no mejora. Tiene fiebre. Pierde peso o lesotho de peso de forma repentina. Tiene dolores de [place]. Tiene problemas para conciliar el sueo o para dormir en la noche. Se siente enojado, culpable, ansioso o triste. Observa hinchazn en las piernas u otras partes del cuerpo. Solicite ayuda de inmediato si: Se siente confundido, siente que va a desmayarse o se desmaya. Tiene la visin borrosa o tiene dolor de cabeza intenso. Siente dolor intenso en el abdomen, la espalda o el rea entre la cintura y las caderas (pelvis). Tiene dolor de pecho, dificultad para respirar, o latidos cardacos irregulares o acelerados. No puede orinar u [employer] de lo normal. Presenta sangrado anormal del recto, la nariz, los pulmones, los pezones o, si es [place], de la vagina. Vomita sangre. Tiene pensamientos acerca de lastimarse o lastimar a Economist. Estos  sntomas pueden Customer service manager. Solicite ayuda de inmediato. Llame al 911. No espere a ver si los sntomas desaparecen. No conduzca por sus propios medios [employer]. Busque ayuda de inmediato si alguna vez siente que puede hacerse dao a usted mismo o a otros, o tiene pensamientos de Patent examiner a su vida. Dirjase al centro de urgencias ms cercano o: Llame al 911. Llame a National Suicide Prevention Lifeline (Lnea Telefnica Nacional para la Prevencin del Suicidio) al [phone] o al 988. Est disponible las 24 horas del da. Enve un mensaje de texto a la lnea para casos de crisis al [phone]. Resumen Si siente fatiga, tiene una sensacin de cansancio en todo momento y falta de energa o falta de motivacin. La fatiga puede dificultar el inicio o la realizacin de tareas debido al agotamiento. La fatiga de larga duracin (crnica) o extrema puede ser sntoma de una afeccin mdica. Realice ejercicio con regularidad como se lo haya indicado el mdico. Cambie las situaciones que le provocan estrs. Trate de que sus horarios de [place] y personal estn equilibrados. Esta informacin no tiene Theme park manager el consejo del mdico. Asegrese de hacerle al mdico cualquier pregunta que tenga. Document Revised: 11/08/2020 Document Reviewed: 11/08/2020 Elsevier  Patient Education  2024 Elsevier Inc.     Emil Schaumann, MD [facility]

## 2023-09-11 ENCOUNTER — Ambulatory Visit: Payer: Self-pay | Admitting: Emergency Medicine

## 2023-09-11 LAB — URINE CULTURE

## 2023-09-16 DIAGNOSIS — S52609B Unspecified fracture of lower end of unspecified ulna, initial encounter for open fracture type I or II: Secondary | ICD-10-CM | POA: Diagnosis not present

## 2023-09-18 NOTE — Telephone Encounter (Signed)
 Copied from CRM #[id number]. Topic: Clinical - Medication Question >> Sep 18, 2023  3:39 PM [name] wrote: Reason for CRM: patient called in asking if she can get a PDF sent to email [email or web address] showing she takes QUEtiapine  (SEROQUEL ) 100 MG tablet. Patient was requesting to speak to therisa buyt was able to tell me what was needed

## 2023-09-29 ENCOUNTER — Encounter

## 2023-09-29 DIAGNOSIS — G4730 Sleep apnea, unspecified: Secondary | ICD-10-CM | POA: Diagnosis not present

## 2023-09-29 DIAGNOSIS — G4733 Obstructive sleep apnea (adult) (pediatric): Secondary | ICD-10-CM

## 2023-10-05 DIAGNOSIS — M19032 Primary osteoarthritis, left wrist: Secondary | ICD-10-CM | POA: Diagnosis not present

## 2023-10-05 DIAGNOSIS — S4422XD Injury of radial nerve at upper arm level, left arm, subsequent encounter: Secondary | ICD-10-CM | POA: Diagnosis not present

## 2023-10-05 DIAGNOSIS — S52602P Unspecified fracture of lower end of left ulna, subsequent encounter for closed fracture with malunion: Secondary | ICD-10-CM | POA: Diagnosis not present

## 2023-10-05 DIAGNOSIS — S52502P Unspecified fracture of the lower end of left radius, subsequent encounter for closed fracture with malunion: Secondary | ICD-10-CM | POA: Diagnosis not present

## 2023-10-05 DIAGNOSIS — M25832 Other specified joint disorders, left wrist: Secondary | ICD-10-CM | POA: Diagnosis not present

## 2023-10-05 DIAGNOSIS — S52522P Torus fracture of lower end of left radius, subsequent encounter for fracture with malunion: Secondary | ICD-10-CM | POA: Diagnosis not present

## 2023-10-06 ENCOUNTER — Telehealth: Payer: Self-pay

## 2023-10-06 NOTE — Telephone Encounter (Signed)
 Copied from CRM #[id number]. Topic: Referral - Question >> Oct 06, 2023 12:56 PM April Bowman wrote: Reason for RMF:April Bowman  stated that from her bone density results,Dr Sagardis wanted her to start a treatment.However,she would like to know what treatment and when will she start it?

## 2023-10-07 DIAGNOSIS — G4733 Obstructive sleep apnea (adult) (pediatric): Secondary | ICD-10-CM | POA: Diagnosis not present

## 2023-11-02 DIAGNOSIS — S52502P Unspecified fracture of the lower end of left radius, subsequent encounter for closed fracture with malunion: Secondary | ICD-10-CM | POA: Diagnosis not present

## 2023-11-02 DIAGNOSIS — M25832 Other specified joint disorders, left wrist: Secondary | ICD-10-CM | POA: Diagnosis not present

## 2023-11-02 DIAGNOSIS — M19032 Primary osteoarthritis, left wrist: Secondary | ICD-10-CM | POA: Diagnosis not present

## 2023-11-02 DIAGNOSIS — S52602P Unspecified fracture of lower end of left ulna, subsequent encounter for closed fracture with malunion: Secondary | ICD-10-CM | POA: Diagnosis not present

## 2023-11-11 DIAGNOSIS — Z1231 Encounter for screening mammogram for malignant neoplasm of breast: Secondary | ICD-10-CM | POA: Diagnosis not present

## 2023-11-11 LAB — HM MAMMOGRAPHY

## 2023-11-12 ENCOUNTER — Encounter: Payer: Self-pay | Admitting: Emergency Medicine

## 2023-11-13 ENCOUNTER — Other Ambulatory Visit: Payer: Self-pay | Admitting: Emergency Medicine

## 2023-11-13 DIAGNOSIS — F5101 Primary insomnia: Secondary | ICD-10-CM

## 2023-11-13 NOTE — Telephone Encounter (Unsigned)
 Copied from CRM [id number]. Topic: Clinical - Medication Refill >> Nov 13, 2023  4:27 PM Ashley R wrote: Medication: zolpidem  (AMBIEN ) 10 MG tablet   Has the patient contacted their pharmacy? Yes, Expired   This is the patient's preferred pharmacy:  [id number] [place], [place] - [address] AT [facility] OF [facility] & [address] [place] [address] Phone: [phone] Fax: [phone]  Is this the correct pharmacy for this prescription? Yes If no, delete pharmacy and type the correct one.   Has the prescription been filled recently? Yes  Is the patient out of the medication? Yes  Has the patient been seen for an appointment in the last year OR does the patient have an upcoming appointment? Yes  Can we respond through MyChart? No  Agent: Please be advised that Rx refills may take up to 3 business days. We ask that you follow-up with your pharmacy.

## 2023-11-16 ENCOUNTER — Encounter: Payer: Self-pay | Admitting: Radiology

## 2023-11-16 MED ORDER — ZOLPIDEM TARTRATE 10 MG PO TABS: 10.0000 mg | ORAL_TABLET | Freq: Every day | ORAL | 1 refills | Status: DC

## 2023-11-23 ENCOUNTER — Encounter (HOSPITAL_COMMUNITY): Payer: Self-pay | Admitting: Psychiatry

## 2023-11-23 ENCOUNTER — Telehealth (HOSPITAL_COMMUNITY): Admitting: Psychiatry

## 2023-11-23 VITALS — Wt 153.0 lb

## 2023-11-23 DIAGNOSIS — F5101 Primary insomnia: Secondary | ICD-10-CM

## 2023-11-23 MED ORDER — ZOLPIDEM TARTRATE 10 MG PO TABS
10.0000 mg | ORAL_TABLET | Freq: Every day | ORAL | 3 refills | Status: AC
Start: 2023-11-23 — End: 2024-03-22

## 2023-11-23 NOTE — Progress Notes (Signed)
 [facility] MD Virtual Progress Note   Patient Location: Home Provider Location: Home Office  I connect with patient by video and verified that I am speaking with correct person by using two identifiers. I discussed the limitations of evaluation and management by telemedicine and the availability of in person appointments. I also discussed with the patient that there may be a patient responsible charge related to this service. The patient expressed understanding and agreed to proceed.  [name] [id number] 68 y.o.  11/23/2023 9:59 AM  History of Present Illness:  Patient is evaluated by video session.  She has difficulty logging on the video but later her family member helped.  She told that she need a new refill of Ambien  because she is out of the refill.  I reviewed the chart, it was recently done by her primary care.  I also did research she should have enough refill until this appointment.  When asked why she is asking early refill she told that because she do not have enough medicine.  I explained that controlled substance should not be given earlier and should be taken as prescribed.  She is also taking Seroquel  prescribed by PCP to help her sleep but reported not taking every day.  I explained Seroquel  is antipsychotic medication and it is used for schizophrenia, depression and bipolar.  Patient got little bit frustrated and reported that none of these diagnoses she had and not sure why it was given from primary care.  She admitted only issue is insomnia and she is taking Ambien .  She denies any depressive thoughts, crying spells, hopelessness, agitation, hallucination, paranoia.  She uses CPAP but also endorsed sometimes does not work very well and she need to take the Ambien .  We have recommended sleep studies and message to the sleep doctor but patient had not heard from sleep doctor.  She liked the Ambien  and together with CPAP she is getting 6 hours.  She denies drinking  or using any illegal substances.  She excited about going to [place] to visit her family members.  She like to have enough refills until she come back.  She is on gabapentin  for neuropathy which she believes sometime does not work very well.  Past Psychiatric History: None reported.  However prescribed nortriptyline, mirtazapine, trazodone , hydroxyzine , Xanax and Seroquel  from her primary care.  Doxepin  did not work.   Past Medical History:  Diagnosis Date   Arthritis    Osteoporosis    Sleep apnea     Outpatient Encounter Medications as of 11/23/2023  Medication Sig   acetaminophen  (TYLENOL ) 500 MG tablet Take 2 tablets (1,000 mg total) by mouth every 6 (six) hours as needed for mild pain or fever.   ASCORBIC ACID PO Take 1 tablet by mouth daily. Vitamin C, unknown strength.   Cholecalciferol  (VITAMIN D -3 PO) Take 1 capsule by mouth daily.   Cyanocobalamin  (VITAMIN B-12 PO) Take 1 tablet by mouth daily.   diphenhydrAMINE  (BENADRYL ) 25 MG tablet Take 1 tablet (25 mg total) by mouth every 8 (eight) hours as needed for up to 5 days.   estradiol  (ESTRACE  VAGINAL) 0.1 MG/GM vaginal cream Place 1 g vaginally at bedtime.   gabapentin  (NEURONTIN ) 300 MG capsule Take 1 capsule (300 mg total) by mouth 3 (three) times daily.   MAGNESIUM  OXIDE PO Take 1 tablet by mouth daily.   mirabegron ER (MYRBETRIQ) 50 MG TB24 tablet Take 1 tablet by mouth daily.   MORINGA OLEIFERA PO Take 1 capsule  by mouth daily.   naproxen  (NAPROSYN ) 500 MG tablet Take 1 tablet (500 mg total) by mouth 2 (two) times daily as needed.   Omega-3 Fatty Acids (OMEGA 3 500 PO) Take 500 mg by mouth daily at 2 am.   POTASSIUM GLUCONATE PO Take 1 tablet by mouth daily.   QUEtiapine  (SEROQUEL ) 100 MG tablet Take 100 mg by mouth at bedtime.   rosuvastatin  (CRESTOR ) 10 MG tablet Take 1 tablet (10 mg total) by mouth daily.   Vitamin D , Ergocalciferol , (DRISDOL ) 1.25 MG (50000 UNIT) CAPS capsule TAKE 1 CAPSULE BY MOUTH EVERY 7 DAYS    VITAMIN E PO Take 1 capsule by mouth daily.   zolpidem  (AMBIEN ) 10 MG tablet Take 1 tablet (10 mg total) by mouth at bedtime.   No facility-administered encounter medications on file as of 11/23/2023.    Recent Results (from the past 2160 hours)  TSH     Status: None   Collection Time: 09/10/23  2:13 PM  Result Value Ref Range   TSH 1.12 0.35 - 5.50 uIU/mL  Lipid panel     Status: Abnormal   Collection Time: 09/10/23  2:13 PM  Result Value Ref Range   Cholesterol 212 (H) 0 - 200 mg/dL    Comment: ATP III Classification       Desirable:  < 200 mg/dL               Borderline High:  200 - 239 mg/dL          High:  > = 759 mg/dL   Triglycerides 799.9 (H) 0.0 - 149.0 mg/dL    Comment: Normal:  <849 mg/dLBorderline High:  150 - 199 mg/dL   HDL 36.89 >60.99 mg/dL   VLDL 59.9 0.0 - 59.9 mg/dL   LDL Cholesterol 890 (H) 0 - 99 mg/dL   Total CHOL/HDL Ratio 3     Comment:                Men          Women1/2 Average Risk     3.4          3.3Average Risk          5.0          4.42X Average Risk          9.6          7.13X Average Risk          15.0          11.0                       NonHDL 149.10     Comment: NOTE:  Non-HDL goal should be 30 mg/dL higher than patient's LDL goal (i.e. LDL goal of < 70 mg/dL, would have non-HDL goal of < 100 mg/dL)  Hemoglobin J8r     Status: None   Collection Time: 09/10/23  2:13 PM  Result Value Ref Range   Hgb A1c MFr Bld 6.5 4.6 - 6.5 %    Comment: Glycemic Control Guidelines for People with Diabetes:Non Diabetic:  <6%Goal of Therapy: <7%Additional Action Suggested:  >8%   Comprehensive metabolic panel with GFR     Status: None   Collection Time: 09/10/23  2:13 PM  Result Value Ref Range   Sodium 136 135 - 145 mEq/L   Potassium 4.4 3.5 - 5.1 mEq/L   Chloride 99 96 - 112 mEq/L   CO2 27 19 -  32 mEq/L   Glucose, Bld 92 70 - 99 mg/dL   BUN 19 6 - 23 mg/dL   Creatinine, Ser 9.29 0.40 - 1.20 mg/dL   Total Bilirubin 0.5 0.2 - 1.2 mg/dL   Alkaline Phosphatase  86 39 - 117 U/L   AST 15 0 - 37 U/L   ALT 10 0 - 35 U/L   Total Protein 7.4 6.0 - 8.3 g/dL   Albumin 4.1 3.5 - 5.2 g/dL   GFR 10.46 >39.99 mL/min    Comment: Calculated using the CKD-EPI Creatinine Equation (2021)   Calcium  8.9 8.4 - 10.5 mg/dL  Urine Culture     Status: None   Collection Time: 09/10/23  2:13 PM   Specimen: Blood  Result Value Ref Range   Source: NOT GIVEN    Status: FINAL    Result:      Less than 10,000 CFU/mL of single Gram negative organism isolated. No further testing will be performed. If clinically indicated, recollection using a method to minimize contamination, with prompt transfer to Urine Culture Transport Tube, is recommended.  CBC with Differential/Platelet     Status: None   Collection Time: 09/10/23  2:13 PM  Result Value Ref Range   WBC 9.0 4.0 - 10.5 K/uL   RBC 4.68 3.87 - 5.11 Mil/uL   Hemoglobin 14.2 12.0 - 15.0 g/dL   HCT 57.2 63.9 - 53.9 %   MCV 91.1 78.0 - 100.0 fl   MCHC 33.3 30.0 - 36.0 g/dL   RDW 86.6 88.4 - 84.4 %   Platelets 343.0 150.0 - 400.0 K/uL   Neutrophils Relative % 61.9 43.0 - 77.0 %   Lymphocytes Relative 28.5 12.0 - 46.0 %   Monocytes Relative 7.6 3.0 - 12.0 %   Eosinophils Relative 1.5 0.0 - 5.0 %   Basophils Relative 0.5 0.0 - 3.0 %   Neutro Abs 5.6 1.4 - 7.7 K/uL   Lymphs Abs 2.6 0.7 - 4.0 K/uL   Monocytes Absolute 0.7 0.1 - 1.0 K/uL   Eosinophils Absolute 0.1 0.0 - 0.7 K/uL   Basophils Absolute 0.0 0.0 - 0.1 K/uL  HM MAMMOGRAPHY     Status: None   Collection Time: 11/11/23  8:15 AM  Result Value Ref Range   HM Mammogram 0-4 Bi-Rad 0-4 Bi-Rad, Self Reported Normal    Comment: abs by him     Psychiatric Specialty Exam: Physical Exam  Review of Systems  Neurological:  Positive for numbness.    Weight 153 lb (69.4 kg).There is no height or weight on file to calculate BMI.  General Appearance: Casual  Eye Contact:  Fair  Speech:  Slow  Volume:  Normal  Mood:  Anxious  Affect:  Appropriate  Thought Process:   Goal Directed  Orientation:  Full (Time, Place, and Person)  Thought Content:  Logical  Suicidal Thoughts:  No  Homicidal Thoughts:  No  Memory:  Immediate;   Fair Recent;   Fair Remote;   Fair  Judgement:  Fair  Insight:  Present  Psychomotor Activity:  Normal  Concentration:  Concentration: Fair and Attention Span: Fair  Recall:  [employer] of Knowledge:  Fair  Language:  Fair  Akathisia:  No  Handed:  Right  AIMS (if indicated):     Assets:  Communication Skills Desire for Improvement Housing Social Support Transportation  ADL's:  Intact  Cognition:  WNL  Sleep:  6 hrs with CPAP and Ambien         05/21/2023  11:14 AM 05/21/2023   11:05 AM 05/18/2023    3:12 PM 03/18/2023   10:39 AM  Depression screen PHQ 2/9  Decreased Interest 0 0 0 0  Down, Depressed, Hopeless 0 3 3 0  PHQ - 2 Score 0 3 3 0  Altered sleeping 3 3 3    Tired, decreased energy 2 1 1    Change in appetite 0 0 0   Feeling bad or failure about yourself  0 0 0   Trouble concentrating 0 0 0   Moving slowly or fidgety/restless 0 0 0   Suicidal thoughts 0 0 0   PHQ-9 Score 5  7  7     Difficult doing work/chores  Not difficult at all Not difficult at all      Data saved with a previous flowsheet row definition    Assessment/Plan: Primary insomnia - Plan: zolpidem  (AMBIEN ) 10 MG tablet  Patient is 68 year old female with history of sleep apnea, osteoarthritis, and degenerative joint disease, atherosclerosis, currently taking Ambien  10 mg recently filled by primary care and Seroquel  100 mg also filled by outside provider.  I had a long discussion with the patient about psychotropic medication.  She do not recall having diagnosis of depression, bipolar disorder, psychosis.  I recommend not to take the Seroquel  because she does not have above diagnosis.  She admitted that she takes Seroquel  on and off to help the sleep.  I explained it is off label but best way is to get the sleep study.  I also discussed that she  need to decide 1 provider to get sleep medicine.  Recently she had filled the prescription from primary care but in the past she was getting from us .  I encourage to make a decision and stick with one doctor.  She prefer that she will get the Ambien  from our office and she will informed her primary care that in the future Ambien  should not be renewed from his office.  She like enough refills.  I will send the prescription to the pharmacy with 3 additional refills.  In the future she will contact our office for Ambien  refills.  I will also forward my note to her PCP.  I also recommend discontinue Seroquel  and ask PCP to get sleep study.  We have referred sleep studies in the past but she has not received any callback.  She excited about going to [place] to visit her family.  I recommend to call back if she is any question, concern if she feels worsening of the symptoms.  We discussed hypnotics dependency, withdrawal and tolerance.  Follow-up in 4 months.   Follow Up Instructions:     I discussed the assessment and treatment plan with the patient. The patient was provided an opportunity to ask questions and all were answered. The patient agreed with the plan and demonstrated an understanding of the instructions.   The patient was advised to call back or seek an in-person evaluation if the symptoms worsen or if the condition fails to improve as anticipated.    Collaboration of Care: Other provider involved in patient's care AEB notes are available in epic to review  Patient/Guardian was advised Release of Information must be obtained prior to any record release in order to collaborate their care with an outside provider. Patient/Guardian was advised if they have not already done so to contact the registration department to sign all necessary forms in order for us  to release information regarding their care.   Consent: Patient/Guardian  gives verbal consent for treatment and assignment of benefits for  services provided during this visit. Patient/Guardian expressed understanding and agreed to proceed.     Total encounter time 28 minutes which includes face-to-face time, chart reviewed, care coordination, order entry and documentation during this encounter.   Note: This document was prepared by [employer] voice dictation technology and any errors that results from this process are unintentional.    Leni ONEIDA Client, MD 11/23/2023

## 2023-11-26 DIAGNOSIS — R399 Unspecified symptoms and signs involving the genitourinary system: Secondary | ICD-10-CM | POA: Diagnosis not present

## 2023-11-26 DIAGNOSIS — N3946 Mixed incontinence: Secondary | ICD-10-CM | POA: Diagnosis not present

## 2023-11-26 DIAGNOSIS — R3915 Urgency of urination: Secondary | ICD-10-CM | POA: Diagnosis not present

## 2023-11-26 DIAGNOSIS — R350 Frequency of micturition: Secondary | ICD-10-CM | POA: Diagnosis not present

## 2023-12-03 DIAGNOSIS — N6489 Other specified disorders of breast: Secondary | ICD-10-CM | POA: Diagnosis not present

## 2023-12-03 DIAGNOSIS — R928 Other abnormal and inconclusive findings on diagnostic imaging of breast: Secondary | ICD-10-CM | POA: Diagnosis not present

## 2023-12-17 ENCOUNTER — Other Ambulatory Visit: Payer: Self-pay | Admitting: Emergency Medicine

## 2023-12-17 DIAGNOSIS — E559 Vitamin D deficiency, unspecified: Secondary | ICD-10-CM

## 2023-12-18 ENCOUNTER — Telehealth (HOSPITAL_COMMUNITY): Payer: Self-pay | Admitting: *Deleted

## 2023-12-18 NOTE — Telephone Encounter (Signed)
 PA for Zolpidem  tartrate 10 mg #30 submitted to [employer] vis Cover My Meds portal.   Approved from 12/18/23 through 01/13/24

## 2024-02-22 ENCOUNTER — Telehealth (HOSPITAL_COMMUNITY): Admitting: Psychiatry

## 2024-02-23 ENCOUNTER — Encounter: Admitting: Pulmonary Disease

## 2024-03-10 ENCOUNTER — Ambulatory Visit

## 2024-05-18 ENCOUNTER — Ambulatory Visit
